# Patient Record
Sex: Female | Born: 1950 | Race: Black or African American | Hispanic: No | Smoking: Never smoker
Health system: Southern US, Community
[De-identification: ages and names within clinical notes are randomized; demographics above are authoritative.]

## PROBLEM LIST (undated history)

## (undated) DIAGNOSIS — H269 Unspecified cataract: Secondary | ICD-10-CM

## (undated) DIAGNOSIS — I10 Essential (primary) hypertension: Secondary | ICD-10-CM

## (undated) DIAGNOSIS — F419 Anxiety disorder, unspecified: Secondary | ICD-10-CM

## (undated) DIAGNOSIS — H3321 Serous retinal detachment, right eye: Secondary | ICD-10-CM

## (undated) DIAGNOSIS — T7840XA Allergy, unspecified, initial encounter: Secondary | ICD-10-CM

## (undated) DIAGNOSIS — D649 Anemia, unspecified: Secondary | ICD-10-CM

## (undated) DIAGNOSIS — N189 Chronic kidney disease, unspecified: Secondary | ICD-10-CM

## (undated) DIAGNOSIS — G4730 Sleep apnea, unspecified: Secondary | ICD-10-CM

## (undated) DIAGNOSIS — I872 Venous insufficiency (chronic) (peripheral): Secondary | ICD-10-CM

## (undated) HISTORY — DX: Serous retinal detachment, right eye: H33.21

## (undated) HISTORY — DX: Unspecified cataract: H26.9

## (undated) HISTORY — PX: CYSTECTOMY: SUR359

## (undated) HISTORY — DX: Anxiety disorder, unspecified: F41.9

## (undated) HISTORY — DX: Allergy, unspecified, initial encounter: T78.40XA

## (undated) HISTORY — PX: POLYPECTOMY: SHX149

## (undated) HISTORY — DX: Sleep apnea, unspecified: G47.30

## (undated) HISTORY — DX: Venous insufficiency (chronic) (peripheral): I87.2

## (undated) HISTORY — PX: COLONOSCOPY: SHX174

## (undated) HISTORY — DX: Anemia, unspecified: D64.9

## (undated) HISTORY — DX: Morbid (severe) obesity due to excess calories: E66.01

## (undated) HISTORY — PX: ABDOMINAL HYSTERECTOMY: SHX81

## (undated) HISTORY — DX: Chronic kidney disease, unspecified: N18.9

## (undated) HISTORY — DX: Essential (primary) hypertension: I10

---

## 1996-11-05 ENCOUNTER — Encounter: Payer: Self-pay | Admitting: Internal Medicine

## 1996-11-05 LAB — CONVERTED CEMR LAB

## 1997-02-02 LAB — HM MAMMOGRAPHY

## 1998-11-24 ENCOUNTER — Ambulatory Visit (HOSPITAL_BASED_OUTPATIENT_CLINIC_OR_DEPARTMENT_OTHER): Admission: RE | Admit: 1998-11-24 | Discharge: 1998-11-24 | Payer: Self-pay | Admitting: General Surgery

## 2001-04-08 ENCOUNTER — Ambulatory Visit (HOSPITAL_COMMUNITY): Admission: RE | Admit: 2001-04-08 | Discharge: 2001-04-08 | Payer: Self-pay | Admitting: Gastroenterology

## 2001-04-08 ENCOUNTER — Encounter: Payer: Self-pay | Admitting: Gastroenterology

## 2003-01-04 ENCOUNTER — Other Ambulatory Visit: Admission: RE | Admit: 2003-01-04 | Discharge: 2003-01-04 | Payer: Self-pay | Admitting: Obstetrics and Gynecology

## 2005-02-19 ENCOUNTER — Ambulatory Visit: Payer: Self-pay | Admitting: Internal Medicine

## 2005-03-20 ENCOUNTER — Ambulatory Visit: Payer: Self-pay | Admitting: Internal Medicine

## 2005-05-10 ENCOUNTER — Ambulatory Visit: Payer: Self-pay | Admitting: Internal Medicine

## 2005-07-09 ENCOUNTER — Ambulatory Visit: Payer: Self-pay | Admitting: Internal Medicine

## 2005-07-23 ENCOUNTER — Ambulatory Visit: Payer: Self-pay | Admitting: Internal Medicine

## 2005-10-18 ENCOUNTER — Ambulatory Visit: Payer: Self-pay | Admitting: Internal Medicine

## 2005-10-22 ENCOUNTER — Ambulatory Visit: Payer: Self-pay | Admitting: Internal Medicine

## 2006-04-28 ENCOUNTER — Ambulatory Visit: Payer: Self-pay | Admitting: Internal Medicine

## 2006-10-24 ENCOUNTER — Ambulatory Visit: Payer: Self-pay | Admitting: Internal Medicine

## 2006-11-17 ENCOUNTER — Ambulatory Visit: Payer: Self-pay | Admitting: Internal Medicine

## 2006-12-09 ENCOUNTER — Ambulatory Visit: Payer: Self-pay | Admitting: Internal Medicine

## 2007-03-06 ENCOUNTER — Ambulatory Visit: Payer: Self-pay | Admitting: Internal Medicine

## 2007-06-09 ENCOUNTER — Encounter: Payer: Self-pay | Admitting: Internal Medicine

## 2007-06-09 DIAGNOSIS — Z862 Personal history of diseases of the blood and blood-forming organs and certain disorders involving the immune mechanism: Secondary | ICD-10-CM | POA: Insufficient documentation

## 2007-06-09 DIAGNOSIS — I872 Venous insufficiency (chronic) (peripheral): Secondary | ICD-10-CM | POA: Insufficient documentation

## 2007-06-09 DIAGNOSIS — D649 Anemia, unspecified: Secondary | ICD-10-CM | POA: Insufficient documentation

## 2007-06-09 DIAGNOSIS — F411 Generalized anxiety disorder: Secondary | ICD-10-CM

## 2007-06-09 DIAGNOSIS — R635 Abnormal weight gain: Secondary | ICD-10-CM | POA: Insufficient documentation

## 2007-06-09 DIAGNOSIS — Z8639 Personal history of other endocrine, nutritional and metabolic disease: Secondary | ICD-10-CM

## 2007-06-09 DIAGNOSIS — I10 Essential (primary) hypertension: Secondary | ICD-10-CM | POA: Insufficient documentation

## 2007-06-09 DIAGNOSIS — F419 Anxiety disorder, unspecified: Secondary | ICD-10-CM | POA: Insufficient documentation

## 2007-06-12 ENCOUNTER — Ambulatory Visit: Payer: Self-pay | Admitting: Internal Medicine

## 2007-09-21 ENCOUNTER — Encounter: Payer: Self-pay | Admitting: Internal Medicine

## 2007-10-08 ENCOUNTER — Encounter: Payer: Self-pay | Admitting: Internal Medicine

## 2007-10-12 ENCOUNTER — Ambulatory Visit: Payer: Self-pay | Admitting: Internal Medicine

## 2007-10-16 LAB — CONVERTED CEMR LAB
BUN: 9 mg/dL (ref 6–23)
Basophils Relative: 0.4 % (ref 0.0–1.0)
CO2: 32 meq/L (ref 19–32)
Calcium: 9.5 mg/dL (ref 8.4–10.5)
Creatinine, Ser: 0.8 mg/dL (ref 0.4–1.2)
Eosinophils Relative: 1.4 % (ref 0.0–5.0)
GFR calc Af Amer: 95 mL/min
Glucose, Bld: 95 mg/dL (ref 70–99)
HCT: 32.7 % — ABNORMAL LOW (ref 36.0–46.0)
Hemoglobin: 11.1 g/dL — ABNORMAL LOW (ref 12.0–15.0)
Leukocytes, UA: NEGATIVE
Lymphocytes Relative: 28.1 % (ref 12.0–46.0)
Monocytes Absolute: 0.5 10*3/uL (ref 0.2–0.7)
Monocytes Relative: 8.5 % (ref 3.0–11.0)
Neutro Abs: 3.6 10*3/uL (ref 1.4–7.7)
Nitrite: NEGATIVE
Potassium: 3.3 meq/L — ABNORMAL LOW (ref 3.5–5.1)
RDW: 12.8 % (ref 11.5–14.6)
Specific Gravity, Urine: 1.02 (ref 1.000–1.03)
Total Protein, Urine: NEGATIVE mg/dL
Triglycerides: 69 mg/dL (ref 0–149)
Urobilinogen, UA: 0.2 (ref 0.0–1.0)
VLDL: 14 mg/dL (ref 0–40)
WBC: 5.9 10*3/uL (ref 4.5–10.5)
pH: 6 (ref 5.0–8.0)

## 2007-10-19 ENCOUNTER — Encounter: Payer: Self-pay | Admitting: Internal Medicine

## 2007-10-20 ENCOUNTER — Telehealth: Payer: Self-pay | Admitting: Internal Medicine

## 2007-10-21 ENCOUNTER — Telehealth: Payer: Self-pay | Admitting: Internal Medicine

## 2007-11-05 DIAGNOSIS — H3321 Serous retinal detachment, right eye: Secondary | ICD-10-CM

## 2007-11-05 HISTORY — DX: Serous retinal detachment, right eye: H33.21

## 2008-01-22 ENCOUNTER — Encounter: Payer: Self-pay | Admitting: Internal Medicine

## 2008-03-29 ENCOUNTER — Encounter: Payer: Self-pay | Admitting: Internal Medicine

## 2008-04-05 ENCOUNTER — Ambulatory Visit: Payer: Self-pay | Admitting: Internal Medicine

## 2008-04-05 LAB — CONVERTED CEMR LAB
BUN: 7 mg/dL (ref 6–23)
CO2: 31 meq/L (ref 19–32)
Glucose, Bld: 99 mg/dL (ref 70–99)
Potassium: 3.5 meq/L (ref 3.5–5.1)
Sodium: 143 meq/L (ref 135–145)

## 2008-04-08 ENCOUNTER — Encounter: Payer: Self-pay | Admitting: Internal Medicine

## 2008-04-12 ENCOUNTER — Ambulatory Visit: Payer: Self-pay | Admitting: Internal Medicine

## 2008-05-26 ENCOUNTER — Encounter: Payer: Self-pay | Admitting: Internal Medicine

## 2008-09-26 ENCOUNTER — Telehealth: Payer: Self-pay | Admitting: Internal Medicine

## 2008-10-11 ENCOUNTER — Ambulatory Visit: Payer: Self-pay | Admitting: Internal Medicine

## 2008-10-11 DIAGNOSIS — R0683 Snoring: Secondary | ICD-10-CM | POA: Insufficient documentation

## 2008-10-11 DIAGNOSIS — G4700 Insomnia, unspecified: Secondary | ICD-10-CM | POA: Insufficient documentation

## 2008-10-12 ENCOUNTER — Telehealth: Payer: Self-pay | Admitting: Internal Medicine

## 2008-10-12 ENCOUNTER — Encounter: Payer: Self-pay | Admitting: Internal Medicine

## 2008-10-13 LAB — CONVERTED CEMR LAB
Basophils Absolute: 0 10*3/uL (ref 0.0–0.1)
Basophils Relative: 0.7 % (ref 0.0–3.0)
Calcium: 9.3 mg/dL (ref 8.4–10.5)
Creatinine, Ser: 0.9 mg/dL (ref 0.4–1.2)
Eosinophils Absolute: 0.1 10*3/uL (ref 0.0–0.7)
GFR calc Af Amer: 82 mL/min
GFR calc non Af Amer: 68 mL/min
Hemoglobin: 11 g/dL — ABNORMAL LOW (ref 12.0–15.0)
MCHC: 33.2 g/dL (ref 30.0–36.0)
MCV: 79.9 fL (ref 78.0–100.0)
Monocytes Absolute: 0.5 10*3/uL (ref 0.1–1.0)
Neutro Abs: 3.4 10*3/uL (ref 1.4–7.7)
RBC: 4.15 M/uL (ref 3.87–5.11)
RDW: 12.9 % (ref 11.5–14.6)
Sodium: 141 meq/L (ref 135–145)
TSH: 1.15 microintl units/mL (ref 0.35–5.50)

## 2008-11-07 ENCOUNTER — Ambulatory Visit: Payer: Self-pay | Admitting: Pulmonary Disease

## 2008-11-07 DIAGNOSIS — G4733 Obstructive sleep apnea (adult) (pediatric): Secondary | ICD-10-CM | POA: Insufficient documentation

## 2008-11-07 DIAGNOSIS — Z9989 Dependence on other enabling machines and devices: Secondary | ICD-10-CM

## 2008-11-09 ENCOUNTER — Ambulatory Visit (HOSPITAL_BASED_OUTPATIENT_CLINIC_OR_DEPARTMENT_OTHER): Admission: RE | Admit: 2008-11-09 | Discharge: 2008-11-09 | Payer: Self-pay | Admitting: Pulmonary Disease

## 2008-11-09 ENCOUNTER — Encounter: Payer: Self-pay | Admitting: Pulmonary Disease

## 2008-11-22 ENCOUNTER — Ambulatory Visit: Payer: Self-pay | Admitting: Pulmonary Disease

## 2008-11-23 ENCOUNTER — Telehealth (INDEPENDENT_AMBULATORY_CARE_PROVIDER_SITE_OTHER): Payer: Self-pay | Admitting: *Deleted

## 2008-11-30 ENCOUNTER — Ambulatory Visit: Payer: Self-pay | Admitting: Pulmonary Disease

## 2008-12-06 ENCOUNTER — Telehealth (INDEPENDENT_AMBULATORY_CARE_PROVIDER_SITE_OTHER): Payer: Self-pay | Admitting: *Deleted

## 2009-01-02 ENCOUNTER — Ambulatory Visit: Payer: Self-pay | Admitting: Pulmonary Disease

## 2009-01-28 ENCOUNTER — Encounter: Payer: Self-pay | Admitting: Pulmonary Disease

## 2009-04-13 ENCOUNTER — Ambulatory Visit: Payer: Self-pay | Admitting: Internal Medicine

## 2009-04-15 LAB — CONVERTED CEMR LAB
BUN: 20 mg/dL (ref 6–23)
Calcium: 9.1 mg/dL (ref 8.4–10.5)
Chloride: 100 meq/L (ref 96–112)
Cholesterol: 177 mg/dL (ref 0–200)
Creatinine, Ser: 1.2 mg/dL (ref 0.4–1.2)
GFR calc non Af Amer: 58.86 mL/min (ref >60)
Hemoglobin, Urine: NEGATIVE
LDL Cholesterol: 122 mg/dL — ABNORMAL HIGH (ref 0–99)
Nitrite: NEGATIVE
Specific Gravity, Urine: 1.02 (ref 1.000–1.030)
Total Protein, Urine: NEGATIVE mg/dL

## 2009-04-18 ENCOUNTER — Ambulatory Visit: Payer: Self-pay | Admitting: Internal Medicine

## 2009-07-05 ENCOUNTER — Ambulatory Visit: Payer: Self-pay | Admitting: Pulmonary Disease

## 2009-07-27 ENCOUNTER — Telehealth: Payer: Self-pay | Admitting: Internal Medicine

## 2009-09-06 ENCOUNTER — Encounter: Admission: RE | Admit: 2009-09-06 | Discharge: 2009-09-06 | Payer: Self-pay | Admitting: Obstetrics and Gynecology

## 2009-09-06 ENCOUNTER — Encounter (INDEPENDENT_AMBULATORY_CARE_PROVIDER_SITE_OTHER): Payer: Self-pay | Admitting: Obstetrics and Gynecology

## 2009-09-14 ENCOUNTER — Ambulatory Visit: Payer: Self-pay | Admitting: Internal Medicine

## 2009-09-14 LAB — CONVERTED CEMR LAB
Albumin: 3.7 g/dL (ref 3.5–5.2)
CO2: 33 meq/L — ABNORMAL HIGH (ref 19–32)
Calcium: 9 mg/dL (ref 8.4–10.5)
Creatinine, Ser: 0.8 mg/dL (ref 0.4–1.2)
Glucose, Bld: 93 mg/dL (ref 70–99)
Total Protein: 7.5 g/dL (ref 6.0–8.3)

## 2009-11-10 ENCOUNTER — Ambulatory Visit: Payer: Self-pay | Admitting: Internal Medicine

## 2009-12-20 ENCOUNTER — Telehealth: Payer: Self-pay | Admitting: Internal Medicine

## 2010-01-15 ENCOUNTER — Telehealth: Payer: Self-pay | Admitting: Internal Medicine

## 2010-02-02 ENCOUNTER — Ambulatory Visit: Payer: Self-pay | Admitting: Internal Medicine

## 2010-02-02 LAB — CONVERTED CEMR LAB
ALT: 19 units/L (ref 0–35)
Cholesterol: 166 mg/dL (ref 0–200)
GFR calc non Af Amer: 81.82 mL/min (ref >60)
HDL: 50.3 mg/dL (ref >39.00)
Potassium: 4.5 meq/L (ref 3.5–5.1)
Sodium: 143 meq/L (ref 135–145)
TSH: 1.52 microintl units/mL (ref 0.35–5.50)
Total Protein: 7.5 g/dL (ref 6.0–8.3)
VLDL: 6.8 mg/dL (ref 0.0–40.0)

## 2010-02-09 ENCOUNTER — Ambulatory Visit: Payer: Self-pay | Admitting: Internal Medicine

## 2010-02-09 DIAGNOSIS — E041 Nontoxic single thyroid nodule: Secondary | ICD-10-CM | POA: Insufficient documentation

## 2010-02-23 ENCOUNTER — Encounter: Admission: RE | Admit: 2010-02-23 | Discharge: 2010-02-23 | Payer: Self-pay | Admitting: Internal Medicine

## 2010-03-02 ENCOUNTER — Telehealth: Payer: Self-pay | Admitting: Internal Medicine

## 2010-03-07 ENCOUNTER — Encounter: Admission: RE | Admit: 2010-03-07 | Discharge: 2010-03-07 | Payer: Self-pay | Admitting: Internal Medicine

## 2010-03-07 ENCOUNTER — Other Ambulatory Visit: Admission: RE | Admit: 2010-03-07 | Discharge: 2010-03-07 | Payer: Self-pay | Admitting: Interventional Radiology

## 2010-08-03 ENCOUNTER — Ambulatory Visit: Payer: Self-pay | Admitting: Internal Medicine

## 2010-08-03 LAB — CONVERTED CEMR LAB
ALT: 17 units/L (ref 0–35)
Albumin: 3.7 g/dL (ref 3.5–5.2)
BUN: 11 mg/dL (ref 6–23)
Bilirubin Urine: NEGATIVE
Chloride: 106 meq/L (ref 96–112)
Cholesterol: 171 mg/dL (ref 0–200)
Eosinophils Relative: 2 % (ref 0.0–5.0)
Glucose, Bld: 84 mg/dL (ref 70–99)
HCT: 31.2 % — ABNORMAL LOW (ref 36.0–46.0)
Hemoglobin, Urine: NEGATIVE
LDL Cholesterol: 116 mg/dL — ABNORMAL HIGH (ref 0–99)
Leukocytes, UA: NEGATIVE
Lymphs Abs: 1.3 10*3/uL (ref 0.7–4.0)
MCV: 82 fL (ref 78.0–100.0)
Monocytes Absolute: 0.5 10*3/uL (ref 0.1–1.0)
Neutro Abs: 3.5 10*3/uL (ref 1.4–7.7)
Nitrite: NEGATIVE
Platelets: 275 10*3/uL (ref 150.0–400.0)
Potassium: 4.2 meq/L (ref 3.5–5.1)
TSH: 1.66 microintl units/mL (ref 0.35–5.50)
Total Bilirubin: 0.5 mg/dL (ref 0.3–1.2)
Total Protein: 7.1 g/dL (ref 6.0–8.3)
Urobilinogen, UA: 0.2 (ref 0.0–1.0)
WBC: 5.4 10*3/uL (ref 4.5–10.5)
pH: 6 (ref 5.0–8.0)

## 2010-08-10 ENCOUNTER — Encounter: Payer: Self-pay | Admitting: Internal Medicine

## 2010-08-10 ENCOUNTER — Ambulatory Visit: Payer: Self-pay | Admitting: Internal Medicine

## 2010-08-10 DIAGNOSIS — E669 Obesity, unspecified: Secondary | ICD-10-CM | POA: Insufficient documentation

## 2010-08-10 DIAGNOSIS — R9431 Abnormal electrocardiogram [ECG] [EKG]: Secondary | ICD-10-CM | POA: Insufficient documentation

## 2010-08-21 ENCOUNTER — Encounter (INDEPENDENT_AMBULATORY_CARE_PROVIDER_SITE_OTHER): Payer: Self-pay | Admitting: *Deleted

## 2010-08-22 ENCOUNTER — Ambulatory Visit: Payer: Self-pay

## 2010-08-22 ENCOUNTER — Ambulatory Visit (HOSPITAL_COMMUNITY): Admission: RE | Admit: 2010-08-22 | Discharge: 2010-08-22 | Payer: Self-pay | Admitting: Internal Medicine

## 2010-08-22 ENCOUNTER — Ambulatory Visit: Payer: Self-pay | Admitting: Cardiovascular Disease

## 2010-08-22 ENCOUNTER — Encounter: Payer: Self-pay | Admitting: Internal Medicine

## 2010-09-21 ENCOUNTER — Encounter (INDEPENDENT_AMBULATORY_CARE_PROVIDER_SITE_OTHER): Payer: Self-pay | Admitting: *Deleted

## 2010-09-24 ENCOUNTER — Telehealth: Payer: Self-pay | Admitting: Gastroenterology

## 2010-09-24 ENCOUNTER — Ambulatory Visit: Payer: Self-pay | Admitting: Gastroenterology

## 2010-11-09 ENCOUNTER — Ambulatory Visit: Admit: 2010-11-09 | Payer: Self-pay | Admitting: Internal Medicine

## 2010-11-16 ENCOUNTER — Ambulatory Visit
Admission: RE | Admit: 2010-11-16 | Discharge: 2010-11-16 | Payer: Self-pay | Source: Home / Self Care | Attending: Internal Medicine | Admitting: Internal Medicine

## 2010-11-20 ENCOUNTER — Ambulatory Visit
Admission: RE | Admit: 2010-11-20 | Discharge: 2010-11-20 | Payer: Self-pay | Source: Home / Self Care | Attending: Gastroenterology | Admitting: Gastroenterology

## 2010-11-20 ENCOUNTER — Other Ambulatory Visit: Payer: Self-pay | Admitting: Gastroenterology

## 2010-11-27 ENCOUNTER — Encounter: Payer: Self-pay | Admitting: Gastroenterology

## 2010-12-04 NOTE — Progress Notes (Signed)
Summary: RESULTS  Phone Note Outgoing Call   Call placed by: Orlan Leavens,  March 02, 2010 11:30 AM Call placed to: Patient Summary of Call: Notified pt concerning her thyroid ultrasound. pt states she would like for md to proceed with biopsy. pls contact pt once test has been set-up. Initial call taken by: Orlan Leavens,  March 02, 2010 11:31 AM  Follow-up for Phone Call        OK Follow-up by: Tresa Garter MD,  March 02, 2010 1:07 PM

## 2010-12-04 NOTE — Progress Notes (Signed)
Summary: swelling  Phone Note Call from Patient Call back at Work Phone [phone]   Summary of Call: Patient was seen recently and some of her meds were changed. Patient called today C/O terrible swelling of her legs, ankles, and feet. Please advise. (after 5 pm call patient at home number) Initial call taken by: Lucious Groves,  December 20, 2009 12:06 PM  Follow-up for Phone Call        Stop Amlodipine- may take a few days to get better OV if bad Take spironolactone 2 a day Elevate feet  Follow-up by: Tresa Garter MD,  December 20, 2009 12:43 PM  Additional Follow-up for Phone Call Additional follow up Details #1::        Patient notified. Additional Follow-up by: Lucious Groves,  December 20, 2009 2:35 PM

## 2010-12-04 NOTE — Miscellaneous (Signed)
Summary: [facility] PV  Clinical Lists Changes  Medications: Added new medication of MOVIPREP 100 GM  SOLR (PEG-KCL-NACL-NASULF-NA ASC-C) As per prep instructions. - Signed Rx of MOVIPREP 100 GM  SOLR (PEG-KCL-NACL-NASULF-NA ASC-C) As per prep instructions.;  #1 x 0;  Signed;  Entered by: Ezra Sites RN;  Authorized by: Meryl Dare MD [facility];  Method used: [facility] [address] [phone]*, [address] [place], [place], [place]  [id number], Ph: [phone] or [phone], Fax: [phone] Allergies: Changed allergy or adverse reaction from PENICILLIN to PENICILLIN    Prescriptions: MOVIPREP 100 GM  SOLR (PEG-KCL-NACL-NASULF-NA ASC-C) As per prep instructions.  #1 x 0   Entered by:   Ezra Sites RN   Authorized by:   Meryl Dare MD [facility]   Signed by:   Ezra Sites RN on 09/24/2010   Method used:   Electronically to        [facility] [address] [phone]* (retail)       [address]       [place], [place]  [id number]       Ph: [phone] or [phone]       Fax: [phone]   RxID:   [id number]

## 2010-12-04 NOTE — Assessment & Plan Note (Signed)
Summary: 3 MO ROV /NWS  #   Vital Signs:  Patient profile:   60 year old female Height:      65 inches Weight:      261.50 pounds BMI:     43.67 O2 Sat:      93 % on Room air Temp:     97.5 degrees F oral Pulse rate:   89 / minute BP sitting:   140 / 90  (left arm) Cuff size:   large  Vitals Entered By: Lucious Groves (February 09, 2010 2:42 PM)  O2 Flow:  Room air CC: 3 mo f/u--Pt states that her edema is better, but she would like to discuss thyroid screening done at work./kb Is Patient Diabetic? No Pain Assessment Patient in pain? no        Primary Care Provider:  Tresa Garter MD  CC:  3 mo f/u--Pt states that her edema is better and but she would like to discuss thyroid screening done at work./kb.  History of Present Illness: The patient presents for a follow up of hypertension, hypokalemia and obesity C/o cyst on thyroid by [place] - Stroke prevention at work   Current Medications (verified): 1)  Cozaar 100 Mg Tabs (Losartan Potassium) .Marland Kitchen.. 1 By Mouth Qd 2)  Aldactone 50 Mg Tabs (Spironolactone) .... Take 2 Tabs Daily 3)  Cardura 8 Mg  Tabs (Doxazosin Mesylate) .... At Bedtime 4)  Coreg 25 Mg  Tabs (Carvedilol) .... Two Times A Day 5)  Prevacid Solutab 30 Mg  Tbdp (Lansoprazole) .Marland Kitchen.. 1 By Mouth Qd 6)  Allegra 180 Mg  Tabs (Fexofenadine Hcl) .... Once Daily 7)  Vitamin D3 1000 Unit  Tabs (Cholecalciferol) .Marland Kitchen.. 1 By Mouth Daily 8)  Zolpidem Tartrate 10 Mg Tabs (Zolpidem Tartrate) .... Once Daily 9)  Estraderm 0.05 Mg/24hr Pttw (Estradiol) .... Once Daily 10)  C-Pap  Allergies (verified): 1)  ! Penicillin  Past History:  Past Medical History: Last updated: 10/11/2008 Anemia-NOS Anxiety Hypertension Venous insuff. LE Detached retina R 2009 Morbid obesity Poss. OSA  Social History: Last updated: 04/18/2009 Occupation: Married with children. Never Smoked - alot of second hand smoke  Review of Systems  The patient denies weight gain, chest pain,  headaches, abdominal pain, and severe indigestion/heartburn.    Physical Exam  General:  obese female in nad Nose:  no skin breakdown or pressure necrosis from cpap  mask. Mouth:  elongation of soft palate and uvula, small post. pharyngeal space. Neck:  No deformities, masses, or tenderness noted. Lungs:  clear, no wheezing Heart:  rrr, no murmur Abdomen:  soft and nt, bs present Msk:  No deformity or scoliosis noted of thoracic or lumbar spine.   Neurologic:  alert, does not appear sleepy moves all 4. Skin:  Intact without suspicious lesions or rashes Psych:  Cognition and judgment appear intact. Alert and cooperative with normal attention span and concentration. No apparent delusions, illusions, hallucinations   Impression & Recommendations:  Problem # 1:  HYPOKALEMIA, HX OF (ICD-V12.2) Assessment Improved  Problem # 2:  WEIGHT GAIN (ICD-783.1) Assessment: Improved  Problem # 3:  HYPERTENSION (ICD-401.9) Assessment: Improved  Her updated medication list for this problem includes:    Cozaar 100 Mg Tabs (Losartan potassium) .Marland Kitchen... 1 by mouth qd    Aldactone 50 Mg Tabs (Spironolactone) .Marland Kitchen... Take 2 tabs daily    Cardura 8 Mg Tabs (Doxazosin mesylate) .Marland Kitchen... At bedtime    Coreg 25 Mg Tabs (Carvedilol) .Marland Kitchen..Marland Kitchen Two times a day  Problem # 4:  THYROID NODULE (ICD-241.0) on vasc US Assessment: New  Orders: Radiology Referral (Radiology)  Complete Medication List: 1)  Cozaar 100 Mg Tabs (Losartan potassium) .Marland Kitchen.. 1 by mouth qd 2)  Aldactone 50 Mg Tabs (Spironolactone) .... Take 2 tabs daily 3)  Cardura 8 Mg Tabs (Doxazosin mesylate) .... At bedtime 4)  Coreg 25 Mg Tabs (Carvedilol) .... Two times a day 5)  Prevacid Solutab 30 Mg Tbdp (Lansoprazole) .Marland Kitchen.. 1 by mouth qd 6)  Allegra 180 Mg Tabs (Fexofenadine hcl) .... Once daily 7)  Vitamin D3 1000 Unit Tabs (Cholecalciferol) .Marland Kitchen.. 1 by mouth daily 8)  Zolpidem Tartrate 10 Mg Tabs (Zolpidem tartrate) .... Once daily 9)  Estraderm 0.05  Mg/24hr Pttw (Estradiol) .... Once daily 10)  C-pap   Other Orders: Capillary Blood Glucose/CBG ([address])  Patient Instructions: 1)  Please schedule a follow-up appointment in 6 months well w/labs.

## 2010-12-04 NOTE — Letter (Signed)
Summary: Pre Visit Letter Revised  [facility]  [address] [place], [place] [address]   Phone: [phone]  Fax: [phone]        08/21/2010 MRN: [id number] Jennifer Gibson [address] [place], [place]  [address]             Procedure Date:  10-04-10   Welcome to the Gastroenterology Division at [facility].    You are scheduled to see a nurse for your pre-procedure visit on 09-20-10 at 4:30p.m. on the 3rd floor at [facility], [address]  We ask that you try to arrive at our office 15 minutes prior to your appointment time to allow for check-in.  Please take a minute to review the attached form.  If you answer "Yes" to one or more of the questions on the first page, we ask that you call the person listed at your earliest opportunity.  If you answer "No" to all of the questions, please complete the rest of the form and bring it to your appointment.    Your nurse visit will consist of discussing your medical and surgical history, your immediate family medical history, and your medications.   If you are unable to list all of your medications on the form, please bring the medication bottles to your appointment and we will list them.  We will need to be aware of both prescribed and over the counter drugs.  We will need to know exact dosage information as well.    Please be prepared to read and sign documents such as consent forms, a financial agreement, and acknowledgement forms.  If necessary, and with your consent, a friend or relative is welcome to sit-in on the nurse visit with you.  Please bring your insurance card so that we may make a copy of it.  If your insurance requires a referral to see a specialist, please bring your referral form from your primary care physician.  No co-pay is required for this nurse visit.     If you cannot keep your appointment, please call [phone] to cancel or reschedule prior to your appointment date.  This allows  [place] the opportunity to schedule an appointment for another patient in need of care.    Thank you for choosing  Gastroenterology for your medical needs.  We appreciate the opportunity to care for you.  Please visit [place] at our website  to learn more about our practice.  Sincerely, The Gastroenterology Division

## 2010-12-04 NOTE — Assessment & Plan Note (Signed)
Summary: 6 MO ROV /NWS $50   Vital Signs:  Patient profile:   60 year old female Weight:      269 pounds Temp:     98.3 degrees F oral Pulse rate:   93 / minute BP sitting:   142 / 80  (left arm)  Vitals Entered By: Tora Perches (November 10, 2009 2:58 PM) CC: f/u Is Patient Diabetic? No   Primary Care Provider:  Tresa Garter MD  CC:  f/u.  History of Present Illness: The patient presents for a follow up of hypertension, hypokalemia, hyperlipidemia   Preventive Screening-Counseling & Management  Alcohol-Tobacco     Smoking Status: never  Allergies: 1)  ! Penicillin  Past History:  Past Medical History: Last updated: 10/11/2008 Anemia-NOS Anxiety Hypertension Venous insuff. LE Detached retina R 2009 Morbid obesity Poss. OSA  Past Surgical History: Last updated: 10/12/2007 Hysterectomy, partial  Social History: Last updated: 04/18/2009 Occupation: Married with children. Never Smoked - alot of second hand smoke  Review of Systems  The patient denies fever, chest pain, abdominal pain, and melena.    Physical Exam  General:  obese female in nad Eyes:  PERRLA and EOMI.   Nose:  no skin breakdown or pressure necrosis from cpap  mask. Mouth:  elongation of soft palate and uvula, small post. pharyngeal space. Lungs:  clear, no wheezing Heart:  rrr, no murmur Abdomen:  soft and nt, bs present Msk:  No deformity or scoliosis noted of thoracic or lumbar spine.   Neurologic:  alert, does not appear sleepy moves all 4. Skin:  Intact without suspicious lesions or rashes Psych:  Cognition and judgment appear intact. Alert and cooperative with normal attention span and concentration. No apparent delusions, illusions, hallucinations   Impression & Recommendations:  Problem # 1:  HYPOKALEMIA, HX OF (ICD-V12.2) Assessment Unchanged On prescription therapy   Problem # 2:  WEIGHT GAIN (ICD-783.1) Assessment: Deteriorated See "Patient Instructions".     Problem # 3:  HYPERTENSION (ICD-401.9) Assessment: Comment Only  The following medications were removed from the medication list:    Bumex 2 Mg Tabs (Bumetanide) .Marland Kitchen... 1/2 tab by mouth daily    Benicar Hct 40-25 Mg Tabs (Olmesartan medoxomil-hctz) ..... Once daily Her updated medication list for this problem includes:    Cozaar 100 Mg Tabs (Losartan potassium) .Marland Kitchen... 1 by mouth qd    Aldactone 50 Mg Tabs (Spironolactone) .Marland Kitchen... 1 by mouth once daily in am    Cardura 8 Mg Tabs (Doxazosin mesylate) .Marland Kitchen... At bedtime    Coreg 25 Mg Tabs (Carvedilol) .Marland Kitchen..Marland Kitchen Two times a day    Amlodipine Besylate 10 Mg Tabs (Amlodipine besylate) .Marland Kitchen... 1 tablet by mouth once a day  Problem # 4:  OBSTRUCTIVE SLEEP APNEA (ICD-327.23) Assessment: Comment Only on On prescription therapy - CPAP  Complete Medication List: 1)  Cozaar 100 Mg Tabs (Losartan potassium) .Marland Kitchen.. 1 by mouth qd 2)  Aldactone 50 Mg Tabs (Spironolactone) .Marland Kitchen.. 1 by mouth once daily in am 3)  Cardura 8 Mg Tabs (Doxazosin mesylate) .... At bedtime 4)  Coreg 25 Mg Tabs (Carvedilol) .... Two times a day 5)  Amlodipine Besylate 10 Mg Tabs (Amlodipine besylate) .Marland Kitchen.. 1 tablet by mouth once a day 6)  Prevacid Solutab 30 Mg Tbdp (Lansoprazole) .Marland Kitchen.. 1 by mouth qd 7)  Allegra 180 Mg Tabs (Fexofenadine hcl) .... Once daily 8)  Vitamin D3 1000 Unit Tabs (Cholecalciferol) .Marland Kitchen.. 1 by mouth daily 9)  Zolpidem Tartrate 10 Mg Tabs (Zolpidem  tartrate) .... Once daily 10)  Estraderm 0.05 Mg/24hr Pttw (Estradiol) .... Once daily 11)  C-pap   Patient Instructions: 1)  Please schedule a follow-up appointment in 3 months. 2)  BMP prior to visit, ICD-9: 3)  Hepatic Panel prior to visit, ICD-9: 4)  Lipid Panel prior to visit, ICD-9: 5)  TSH prior to visit, ICD-9:401.1  995.2 6)  Try to eat more raw plant food, fresh and dry fruit, raw almonds, leafy vegetables, whole foods and less red meat, less animal fat. Poultry and fish is better for you than pork and beef.  Avoid processed foods (canned soups, hot dogs, sausage, bacon , frozen dinners). Avoid corn syrup, high fructose syrup or aspartam and Splenda  containing drinks. Honey, Agave and Stevia are better sweeteners. Make your own  dressing with olive oil, wine vinegar, lemon juce, garlic etc. for your salads. Prescriptions: ALDACTONE 50 MG TABS (SPIRONOLACTONE) 1 by mouth once daily in am  #30 x 12   Entered and Authorized by:   Tresa Garter MD   Signed by:   Tresa Garter MD on 11/10/2009   Method used:   [facility]        [facility] [address] [phone]* (retail)       [address]       [place], [place]  [id number]       Ph: [phone] or [phone]       Fax: [phone]   RxID:   [id number] COZAAR 100 MG TABS (LOSARTAN POTASSIUM) 1 by mouth qd  #30 x 12   Entered and Authorized by:   Tresa Garter MD   Signed by:   Tresa Garter MD on 11/10/2009   Method used:   [facility] to        [facility]  [address] [phone]* (retail)       [address]       [place], [place]  [address]       Ph: [phone] or [phone]       Fax: [phone]   RxID:   [id number] ALDACTONE 50 MG TABS (SPIRONOLACTONE) 1 by mouth once daily in am30  #30 x 12   Entered and Authorized by:   Tresa Garter MD   Signed by:   Tresa Garter MD on 11/10/2009   Method used:   Electronically to        [facility]  [address] [phone]* (retail)       [address]       [place], [place]  [id number]       Ph: [phone] or [phone]       Fax: [phone]   RxID:   [id number]

## 2010-12-04 NOTE — Progress Notes (Signed)
Summary: ? [facility] Propofol  Phone Note Call from Patient   Summary of Call: Dr. Russella Dar, I saw Jennifer Gibson today in [facility].  Her last colonoscopy with you was 2002.  She had incomplete procedure due to pt. discomfort and had to have barium enema.  Would you like her to have procedure with propofol at [facility]?  Also, Dr. Loren Racer ov 10-7 says pt needs EGD and colonoscopy.  Pt is having indigestion symptoms and has had for about 3 years.  I will put pt's chart on your desk for review.  Thanks, Olegario Messier Initial call taken by: Ezra Sites RN,  September 24, 2010 10:47 AM  Follow-up for Phone Call        LEC with propofol... Claudette Head, MD  Called pt to schedule colon at [facility] w/ propofol.  No answer will try pt later.  Ezra Sites RN  September 24, 2010 2:10 PM  Pt scheduled for EGD/Colon at [facility] w/ Propofol on 11/20/10 at 1:30.  Pt notified and changes in prep instructions reviewed w/ pt. Ezra Sites RN  September 25, 2010 7:48 AM  Follow-up by: Meryl Dare MD FACG,  September 24, 2010 1:58 PM  Additional Follow-up for Phone Call Additional follow up Details #1::        Above noted. Please hold this note on your desktop until the plan is finalized. Thx. Additional Follow-up by: Meryl Dare MD Clementeen Graham,  September 24, 2010 3:07 PM

## 2010-12-04 NOTE — Progress Notes (Signed)
Summary: MED Changes  Phone Note Call from Patient   Summary of Call: Patient is requesting refill of spironolactone. See previous phone note. Amlodipine was stopped and spironolactone was increased. EMR updated, rx sent in. Patient is requesting a call when complete.  Initial call taken by: Lamar Sprinkles, CMA,  January 15, 2010 9:52 AM  Follow-up for Phone Call        Left vm for pt to check with her pharmacy Follow-up by: Lamar Sprinkles, CMA,  January 15, 2010 5:24 PM    New/Updated Medications: ALDACTONE 50 MG TABS (SPIRONOLACTONE) take 2 tabs daily Prescriptions: ALDACTONE 50 MG TABS (SPIRONOLACTONE) take 2 tabs daily  #60 x 3   Entered by:   Lamar Sprinkles, CMA   Authorized by:   Tresa Garter MD   Signed by:   Lamar Sprinkles, CMA on 01/15/2010   Method used:   [facility]ronically to        [facility]  [address] [phone]* (retail)       [address]       [place], [place]  [id number]       Ph: [phone] or [phone]       Fax: [phone]   RxID:   [id number]

## 2010-12-04 NOTE — Assessment & Plan Note (Signed)
Summary: CPX /NWS  #   Vital Signs:  Patient profile:   60 year old female Height:      65 inches Weight:      281 pounds BMI:     46.93 Temp:     97.6 degrees F oral Pulse rate:   102 / minute Pulse rhythm:   regular BP sitting:   140 / 80  (left arm) Cuff size:   large  Vitals Entered By: Lanier Prude, Beverly Gust) (August 10, 2010 3:03 PM) CC: CPX Is Patient Diabetic? No Comments pt is not taking Zolpiedm nor does she use her CPAP   Primary Care Provider:  Tresa Garter MD  CC:  CPX.  History of Present Illness: The patient presents for a preventive health examination    Preventive Screening-Counseling & Management  Alcohol-Tobacco     Smoking Status: never  Current Medications (verified): 1)  Cozaar 100 Mg Tabs (Losartan Potassium) .Marland Kitchen.. 1 By Mouth Qd 2)  Aldactone 50 Mg Tabs (Spironolactone) .... Take 2 Tabs Daily 3)  Cardura 8 Mg  Tabs (Doxazosin Mesylate) .... At Bedtime 4)  Coreg 25 Mg  Tabs (Carvedilol) .... Two Times A Day 5)  Prevacid Solutab 30 Mg  Tbdp (Lansoprazole) .Marland Kitchen.. 1 By Mouth Qd 6)  Allegra 180 Mg  Tabs (Fexofenadine Hcl) .... Once Daily 7)  Vitamin D3 1000 Unit  Tabs (Cholecalciferol) .Marland Kitchen.. 1 By Mouth Daily 8)  Zolpidem Tartrate 10 Mg Tabs (Zolpidem Tartrate) .... Once Daily 9)  Estraderm 0.05 Mg/24hr Pttw (Estradiol) .... Once Daily 10)  C-Pap  Allergies (verified): 1)  ! Penicillin  Past History:  Past Medical History: Last updated: 10/11/2008 Anemia-NOS Anxiety Hypertension Venous insuff. LE Detached retina R 2009 Morbid obesity Poss. OSA  Family History: Last updated: 11/07/2008 Family History Hypertension    heart disease: father(m.i.)  Social History: Last updated: 04/18/2009 Occupation: Married with children. Never Smoked - alot of second hand smoke  Review of Systems       The patient complains of weight gain and dyspnea on exertion.  The patient denies anorexia, fever, weight loss, vision loss, decreased  hearing, hoarseness, chest pain, syncope, peripheral edema, prolonged cough, headaches, hemoptysis, abdominal pain, melena, hematochezia, severe indigestion/heartburn, hematuria, incontinence, genital sores, muscle weakness, suspicious skin lesions, transient blindness, difficulty walking, depression, unusual weight change, abnormal bleeding, enlarged lymph nodes, angioedema, and breast masses.    Physical Exam  General:  obese female in nad Head:  Normocephalic and atraumatic without obvious abnormalities. No apparent alopecia or balding. Eyes:  No corneal or conjunctival inflammation noted. EOMI. Perrlal. Ears:  External ear exam shows no significant lesions or deformities.  Otoscopic examination reveals clear canals, tympanic membranes are intact bilaterally without bulging, retraction, inflammation or discharge. Hearing is grossly normal bilaterally. Nose:  External nasal examination shows no deformity or inflammation. Nasal mucosa are pink and moist without lesions or exudates. Mouth:  elongation of soft palate and uvula, small post. pharyngeal space. Neck:  No deformities, masses, or tenderness noted. Lungs:  clear, no wheezing Heart:  rrr, no murmur Abdomen:  Bowel sounds positive,abdomen soft and non-tender without masses, organomegaly or hernias noted. Msk:  No deformity or scoliosis noted of thoracic or lumbar spine.   Pulses:  R and L carotid,radial,femoral,dorsalis pedis and posterior tibial pulses are full and equal bilaterally Extremities:  No edema Neurologic:  No cranial nerve deficits noted. Station and gait are normal. Plantar reflexes are down-going bilaterally. DTRs are symmetrical throughout. Sensory, motor and coordinative functions  appear intact. Skin:  Intact without suspicious lesions or rashes Cervical Nodes:  No lymphadenopathy noted Inguinal Nodes:  No significant adenopathy Psych:  Cognition and judgment appear intact. Alert and cooperative with normal attention span  and concentration. No apparent delusions, illusions, hallucinations   Impression & Recommendations:  Problem # 1:  ROUTINE GENERAL MEDICAL EXAM@HEALTH  CARE FACL (ICD-V70.0) Assessment New GYN/mammo is pending  Orders: EKG w/ Interpretation (93[address]) Gastroenterology Referral (GI) colon/EGD Health and age related issues were discussed. Available screening tests and vaccinations were discussed as well. Healthy life style including good diet and exercise was discussed.  The labs were reviewed with the patient.   Problem # 2:  WEIGHT GAIN, ABNORMAL (ICD-783.1) 20 lbs Assessment: New Discussed diet  Problem # 3:  ABNORMAL ELECTROCARDIOGRAM (ICD-794.31) Assessment: New  Orders: Echo Referral (Echo)  Problem # 4:  OBSTRUCTIVE SLEEP APNEA (ICD-327.23) Assessment: Unchanged Not using CPAP Pulm consult adviced  Problem # 5:  HYPERTENSION (ICD-401.9) Assessment: Unchanged  Her updated medication list for this problem includes:    Cozaar 100 Mg Tabs (Losartan potassium) .Marland Kitchen... 1 by mouth qd    Aldactone 50 Mg Tabs (Spironolactone) .Marland Kitchen... Take 2 tabs daily    Cardura 8 Mg Tabs (Doxazosin mesylate) .Marland Kitchen... At bedtime    Coreg 25 Mg Tabs (Carvedilol) .Marland Kitchen..Marland Kitchen Two times a day  BP today: 140/80 Prior BP: 140/90 (02/09/2010)  Labs Reviewed: K+: 4.2 (08/03/2010) Creat: : 0.9 (08/03/2010)   Chol: 171 (08/03/2010)   HDL: 45.40 (08/03/2010)   LDL: 116 (08/03/2010)   TG: 50.0 (08/03/2010)  Problem # 6:  OBESITY (ICD-278.00) Assessment: Deteriorated Lap band discussed She will try Phentermine and diet Risks vs benefits and controversies of a long term phentermine use were discussed.   Complete Medication List: 1)  Cozaar 100 Mg Tabs (Losartan potassium) .Marland Kitchen.. 1 by mouth qd 2)  Aldactone 50 Mg Tabs (Spironolactone) .... Take 2 tabs daily 3)  Cardura 8 Mg Tabs (Doxazosin mesylate) .... At bedtime 4)  Coreg 25 Mg Tabs (Carvedilol) .... Two times a day 5)  Prevacid Solutab 30 Mg Tbdp (Lansoprazole)  .Marland Kitchen.. 1 by mouth qd 6)  Allegra 180 Mg Tabs (Fexofenadine hcl) .... Once daily 7)  Vitamin D3 1000 Unit Tabs (Cholecalciferol) .Marland Kitchen.. 1 by mouth daily 8)  Zolpidem Tartrate 10 Mg Tabs (Zolpidem tartrate) .... Once daily 9)  Estraderm 0.05 Mg/24hr Pttw (Estradiol) .... Once daily 10)  C-pap  11)  Phentermine Hcl 37.5 Mg Tabs (Phentermine hcl) .Marland Kitchen.. 1 by mouth qam  Patient Instructions: 1)  [email or web address] 2)  Please schedule a follow-up appointment in 3 months. 3)  CBC w/ Diff prior to visit, ICD-9: 4)  iron 5)  TIBC 6)  Vit B12 280.9 Prescriptions: PHENTERMINE HCL 37.5 MG TABS (PHENTERMINE HCL) 1 by mouth qam  #30 x 2   Entered and Authorized by:   Tresa Garter MD   Signed by:   Tresa Garter MD on 08/10/2010   Method used:   Print then Give to Patient   RxID:   [id number]

## 2010-12-04 NOTE — Miscellaneous (Signed)
Summary: Appointment Canceled  Appointment status changed to canceled by [employer] on 08/13/2010 2:06 PM.  Cancellation Comments --------------------- echo/abn ekg/jml  Appointment Information ----------------------- Appt Type:  CARDIOLOGY ANCILLARY VISIT      Date:  Tuesday, August 14, 2010      Time:  11:30 AM for 60 min   Urgency:  Routine   Made By:  Pearson Grippe  To Visit:  [id number]    Reason:  echo/abn ekg/jml  Appt Comments ------------- -- 08/13/10 14:06: (CEMR) CANCELED -- echo/abn ekg/jml -- 08/13/10 14:02: (CEMR) BOOKED -- Routine CARDIOLOGY ANCILLARY VISIT at 08/14/2010 11:30 AM for 60 min echo/abn ekg/jml

## 2010-12-04 NOTE — Letter (Signed)
Summary: [facility] Instructions  [facility]  [address] [place], [place] [address]   Phone: [phone]  Fax: [phone]       Jennifer Gibson    August 08, 1963    MRN: [id number]        Procedure Day /Date: Thursday 10-04-10     Arrival Time: 8:30 am     Procedure Time: 9:30 am     Location of Procedure:                    _x _  [facility] (4th Floor)                        PREPARATION FOR COLONOSCOPY WITH MOVIPREP   Starting 5 days prior to your procedure  09-29-10  do not eat nuts, seeds, popcorn, corn, beans, peas,  salads, or any raw vegetables.  Do not take any fiber supplements (e.g. Metamucil, Citrucel, and Benefiber).  THE DAY BEFORE YOUR PROCEDURE         DATE:  10-03-10   DAY:  Wednesday  1.  Drink clear liquids the entire day-NO SOLID FOOD  2.  Do not drink anything colored red or purple.  Avoid juices with pulp.  No orange juice.  3.  Drink at least 64 oz. (8 glasses) of fluid/clear liquids during the day to prevent dehydration and help the prep work efficiently.  CLEAR LIQUIDS INCLUDE: Water Jello Ice Popsicles Tea (sugar ok, no milk/cream) Powdered fruit flavored drinks Coffee (sugar ok, no milk/cream) Gatorade Juice: apple, white grape, white cranberry  Lemonade Clear bullion, consomm, broth Carbonated beverages (any kind) Strained chicken noodle soup Hard Candy                             4.  In the morning, mix first dose of MoviPrep solution:    Empty 1 Pouch A and 1 Pouch B into the disposable container    Add lukewarm drinking water to the top line of the container. Mix to dissolve    Refrigerate (mixed solution should be used within 24 hrs)  5.  Begin drinking the prep at 5:00 p.m. The MoviPrep container is divided by 4 marks.   Every 15 minutes drink the solution down to the next mark (approximately 8 oz) until the full liter is complete.   6.  Follow completed prep with 16 oz of clear liquid of your choice  (Nothing red or purple).  Continue to drink clear liquids until bedtime.  7.  Before going to bed, mix second dose of MoviPrep solution:    Empty 1 Pouch A and 1 Pouch B into the disposable container    Add lukewarm drinking water to the top line of the container. Mix to dissolve    Refrigerate  THE DAY OF YOUR PROCEDURE      DATE:  10-04-10  DAY:  Thursday  Beginning at  4:30 a.m. (5 hours before procedure):         1. Every 15 minutes, drink the solution down to the next mark (approx 8 oz) until the full liter is complete.  2. Follow completed prep with 16 oz. of clear liquid of your choice.    3. You may drink clear liquids until  7:30 a.m.  (2 HOURS BEFORE PROCEDURE).   MEDICATION INSTRUCTIONS  Unless otherwise instructed, you should take regular prescription medications with a small sip  of water   as early as possible the morning of your procedure.   Additional medication instructions: Do not take Spironolactone day of procedure.         OTHER INSTRUCTIONS  You will need a responsible adult at least 62 years of age to accompany you and drive you home.   This person must remain in the waiting room during your procedure.  Wear loose fitting clothing that is easily removed.  Leave jewelry and other valuables at home.  However, you may wish to bring a book to read or  an iPod/MP3 player to listen to music as you wait for your procedure to start.  Remove all body piercing jewelry and leave at home.  Total time from sign-in until discharge is approximately 2-3 hours.  You should go home directly after your procedure and rest.  You can resume normal activities the  day after your procedure.  The day of your procedure you should not:   Drive   Make legal decisions   Operate machinery   Drink alcohol   Return to work  You will receive specific instructions about eating, activities and medications before you leave.    The above instructions have been  reviewed and explained to me by   Ezra Sites RN  September 24, 2010 9:24 AM    I fully understand and can verbalize these instructions _____________________________ Date _________

## 2010-12-06 ENCOUNTER — Telehealth: Payer: Self-pay | Admitting: Internal Medicine

## 2010-12-06 NOTE — Letter (Signed)
Summary: Patient [facility] Biopsy Results  [facility]  [address] [place], [place] [address]   Phone: [phone]  Fax: [phone]        November 27, 2010 MRN: [id number]    Jennifer Gibson [address] [place], [place]  [address]    Dear Jennifer Gibson,  I am pleased to inform you that the biopsies taken during your recent endoscopic examination did not show any evidence of cancer upon pathologic examination. The biopsies showed benign fundic gland polyps.  Continue with the treatment plan as outlined on the day of your      exam.  Please call us if you are having persistent problems or have questions about your condition that have not been fully answered at this time.  Sincerely,  Meryl Dare MD [facility]  This letter has been electronically signed by your physician.  Appended Document: Patient Notice-Endo Biopsy Results LETTER MAILED

## 2010-12-06 NOTE — Procedures (Addendum)
Summary: Colonoscopy  Patient: Mashell Sieben Note: All result statuses are Final unless otherwise noted.  Tests: (1) Colonoscopy (COL)   COL Colonoscopy           DONE     [facility]     [address]     [place], [place]  [address]           COLONOSCOPY PROCEDURE REPORT     PATIENT:  Jennifer Gibson, Jennifer Gibson  MR#:  [id number]     BIRTHDATE:  25-Nov-1950, 59 yrs. old  GENDER:  female     ENDOSCOPIST:  Judie Petit T. Russella Dar, MD, [facility]           PROCEDURE DATE:  11/20/2010     PROCEDURE:  Colonoscopy with biopsy and snare polypectomy     ASA CLASS:  Class III     INDICATIONS:  1) Routine Risk Screening     MEDICATIONS:   propofol (Diprivan) 300 mg IV     DESCRIPTION OF PROCEDURE:   After the risks benefits and     alternatives of the procedure were thoroughly explained, informed     consent was obtained.  Digital rectal exam was performed and     revealed no abnormalities.   The LB [id number]-[id number] endoscope     was introduced through the anus and advanced to the cecum, which     was identified by both the appendix and ileocecal valve, without     limitations.  The quality of the prep was excellent, using     MoviPrep.  The instrument was then slowly withdrawn as the colon     was fully examined.     <<PROCEDUREIMAGES>>     FINDINGS:  A sessile polyp was found in the cecum. It was 5 mm in     size. Polyp was snared without cautery. Retrieval was successful.     Two polyps were found in the transverse colon. They were 4 mm in     size. The polyps were removed using cold biopsy forceps. Three     polyps were found in the descending colon. They were 4 - 5 mm in     size. Polyps were snared without cautery. Retrieval was     successful. A normal appearing ileocecal valve, and appendiceal     orifice were identified. The ascending, hepatic flexure, splenic     flexure, sigmoid colon, and rectum appeared unremarkable.     Retroflexed views in the rectum revealed no abnormalities. The time  to cecum =  2.25  minutes. The scope was then withdrawn (time =     10  min) from the patient and the procedure completed.     COMPLICATIONS:  None           ENDOSCOPIC IMPRESSION:     1) 5 mm sessile polyp in the cecum     2) 4 mm Two polyps in the transverse colon     3) 4 - 5 mm Three polyps in the descending colon           RECOMMENDATIONS:     1) Await pathology results     2) If the polyps are adenomatous (pre-cancerous) polyps,     colonoscopy in 5 years. Otherwise  colorectal cancer screening     guidelines for "routine risk" patients with colonoscopy in 10     years.           Venita Lick. Russella Dar, MD, [facility]  CC: Trayonna Hedges. Plotnikov, MD           n.     Rosalie DoctorJudie Petit T. Derricka Mertz at 11/20/2010 02:01 PM           Davena, Julian, [id number]  Note: An exclamation mark (!) indicates a result that was not dispersed into the flowsheet. Document Creation Date: 11/20/2010 2:01 PM _______________________________________________________________________  (1) Order result status: Final Collection or observation date-time: 11/20/2010 13:50 Requested date-time:  Receipt date-time:  Reported date-time:  Referring Physician:   Ordering Physician: Claudette Head [phone]) Specimen Source:  Source: Launa Grill Order Number: [id number] Lab site:   Appended Document: Colonoscopy     Procedures Next Due Date:    Colonoscopy: 11/2015

## 2010-12-06 NOTE — Letter (Signed)
Summary: Patient Notice- Polyp Results  [facility]  [address] [place], [place] [address]   Phone: [phone]  Fax: [phone]        November 27, 2010 MRN: [id number]    Jennifer Gibson [address] [place], [place]  [address]    Dear [name],  I am pleased to inform you that the colon polyp(s) removed during your recent colonoscopy was (were) found to be benign (no cancer detected) upon pathologic examination.  I recommend you have a repeat colonoscopy examination in 5 years to look for recurrent polyps, as having colon polyps increases your risk for having recurrent polyps or even colon cancer in the future.  Should you develop new or worsening symptoms of abdominal pain, bowel habit changes or bleeding from the rectum or bowels, please schedule an evaluation with either your primary care physician or with me.  Continue treatment plan as outlined the day of your exam.  Please call us if you are having persistent problems or have questions about your condition that have not been fully answered at this time.  Sincerely,  Meryl Dare MD [facility]  This letter has been electronically signed by your physician.  Appended Document: Patient Notice- Polyp Results LETTER MAILED

## 2010-12-06 NOTE — Procedures (Addendum)
Summary: Upper Endoscopy  Patient: [name] Note: All result statuses are Final unless otherwise noted.  Tests: (1) Upper Endoscopy (EGD)   EGD Upper Endoscopy       DONE     [facility]     [address]     [place], [place]  [address]           ENDOSCOPY PROCEDURE REPORT     PATIENT:  Jennifer Gibson, Jennifer Gibson  MR#:  [id number]     BIRTHDATE:  12/05/1950, 59 yrs. old  GENDER:  female     ENDOSCOPIST:  Judie Petit T. Russella Dar, MD, [facility]           PROCEDURE DATE:  11/20/2010     PROCEDURE:  EGD with biopsy, [address]     ASA CLASS:  Class III     INDICATIONS:  GERD     MEDICATIONS:  There was residual sedation effect present from     prior procedure, propofol (Diprivan) 100 mg IV     TOPICAL ANESTHETIC:  none     DESCRIPTION OF PROCEDURE:   After the risks benefits and     alternatives of the procedure were thoroughly explained, informed     consent was obtained.  The LB GIF-H180 [id number] endoscope was     introduced through the mouth and advanced to the second portion of     the duodenum, without limitations.  The instrument was slowly     withdrawn as the mucosa was fully examined.     <<PROCEDUREIMAGES>>     There were multiple polyps identified. in the body and fundus of     the stomach. They were 3-7 mm in size. Multiple biopsies were     obtained and sent to pathology.  Otherwise normal examination of     the stomach. The esophagus and gastroesophageal junction were     completely normal in appearance.  The duodenal bulb was normal in     appearance, as was the postbulbar duodenum. Retroflexed views     revealed a hiatal hernia, small. The scope was then withdrawn from     the patient and the procedure completed.           COMPLICATIONS:  None           ENDOSCOPIC IMPRESSION:     1) 3 - 7 mm gastric polyps, multiple     2) Small hiatal hernia           RECOMMENDATIONS:     1) Anti-reflux regimen     2) continue PPI     3) Await pathology report           Judie Petit T. Russella Dar,  MD, [facility]           CC: Sonda Primes, MD           n.     Rosalie DoctorVenita Lick. Stark at 11/20/2010 02:06 PM           Kyandra, Mcclaine, [phone]  Note: An exclamation mark (!) indicates a result that was not dispersed into the flowsheet. Document Creation Date: 11/20/2010 2:06 PM _______________________________________________________________________  (1) Order result status: Final Collection or observation date-time: 11/20/2010 13:59 Requested date-time:  Receipt date-time:  Reported date-time:  Referring Physician:   Ordering Physician: Claudette Head [phone]) Specimen Source:  Source: Launa Grill Order Number: [id number] Lab site:

## 2010-12-06 NOTE — Assessment & Plan Note (Signed)
Summary: 3 mos f/u #/cd   Vital Signs:  Patient profile:   60 year old female Height:      65 inches Weight:      275 pounds BMI:     45.93 Temp:     98.2 degrees F oral Pulse rate:   88 / minute Pulse rhythm:   regular Resp:     16 per minute BP sitting:   134 / 80  (left arm) Cuff size:   large  Vitals Entered By: Lanier Prude, CMA(AAMA) (November 16, 2010 3:08 PM) CC: 3 mo f/u  Is Patient Diabetic? No Comments pt is not taking Allegra, Zolpidem or using her CPAP   Primary Care Provider:  Tresa Garter MD  CC:  3 mo f/u .  History of Present Illness: The patient presents for a follow up of hypertension, OSA, hyperlipidemia  Not using CPAP  Current Medications (verified): 1)  Cozaar 100 Mg Tabs (Losartan Potassium) .Marland Kitchen.. 1 By Mouth Qd 2)  Aldactone 50 Mg Tabs (Spironolactone) .... Take 2 Tabs Daily 3)  Cardura 8 Mg  Tabs (Doxazosin Mesylate) .... At Bedtime 4)  Coreg 25 Mg  Tabs (Carvedilol) .... Two Times A Day 5)  Prevacid Solutab 30 Mg  Tbdp (Lansoprazole) .Marland Kitchen.. 1 By Mouth Qd 6)  Allegra 180 Mg  Tabs (Fexofenadine Hcl) .... Once Daily 7)  Vitamin D3 1000 Unit  Tabs (Cholecalciferol) .Marland Kitchen.. 1 By Mouth Daily 8)  Zolpidem Tartrate 10 Mg Tabs (Zolpidem Tartrate) .... Once Daily 9)  Estraderm 0.05 Mg/24hr Pttw (Estradiol) .... Once Daily 10)  C-Pap 11)  Phentermine Hcl 37.5 Mg Tabs (Phentermine Hcl) .Marland Kitchen.. 1 By Mouth Qam  Allergies (verified): 1)  ! Penicillin  Past History:  Past Medical History: Last updated: 10/11/2008 Anemia-NOS Anxiety Hypertension Venous insuff. LE Detached retina R 2009 Morbid obesity Poss. OSA  Social History: Last updated: 04/18/2009 Occupation: Married with children. Never Smoked - alot of second hand smoke  Review of Systems       The patient complains of weight gain and dyspnea on exertion.  The patient denies chest pain, prolonged cough, and abdominal pain.    Physical Exam  General:  obese female in nad Nose:   External nasal examination shows no deformity or inflammation. Nasal mucosa are pink and moist without lesions or exudates. Mouth:  elongation of soft palate and uvula, small post. pharyngeal space. Neck:  No deformities, masses, or tenderness noted. Lungs:  clear, no wheezing Heart:  rrr, no murmur Abdomen:  Bowel sounds positive,abdomen soft and non-tender without masses, organomegaly or hernias noted. Msk:  No deformity or scoliosis noted of thoracic or lumbar spine.   Extremities:  No edema Neurologic:  No cranial nerve deficits noted. Station and gait are normal. Plantar reflexes are down-going bilaterally. DTRs are symmetrical throughout. Sensory, motor and coordinative functions appear intact. Skin:  Intact without suspicious lesions or rashes Cervical Nodes:  No lymphadenopathy noted Psych:  Cognition and judgment appear intact. Alert and cooperative with normal attention span and concentration. No apparent delusions, illusions, hallucinations   Impression & Recommendations:  Problem # 1:  OBESITY (ICD-278.00) Assessment Improved Diet discussed  Problem # 2:  OBSTRUCTIVE SLEEP APNEA (ICD-327.23) Assessment: Deteriorated Risks of noncompliance with treatment discussed. Compliance encouraged.   Problem # 3:  HYPERTENSION (ICD-401.9) Assessment: Unchanged  Her updated medication list for this problem includes:    Cozaar 100 Mg Tabs (Losartan potassium) .Marland Kitchen... 1 by mouth qd    Aldactone 50 Mg Tabs (Spironolactone) .Marland KitchenMarland KitchenMarland KitchenMarland Kitchen  Take 2 tabs daily    Cardura 8 Mg Tabs (Doxazosin mesylate) .Marland Kitchen... At bedtime    Coreg 25 Mg Tabs (Carvedilol) .Marland Kitchen..Marland Kitchen Two times a day  Problem # 4:  HRT (ICD-V07.4) Assessment: Comment Only  Risks incl CVA, DVT etc vs benefits and controversies of a long term HRT use were discussed.   Problem # 5:  WEIGHT GAIN (ICD-783.1) Assessment: Improved  Problem # 6:  ANXIETY (ICD-300.00) Assessment: Improved  Complete Medication List: 1)  Cozaar 100 Mg Tabs  (Losartan potassium) .Marland Kitchen.. 1 by mouth qd 2)  Aldactone 50 Mg Tabs (Spironolactone) .... Take 2 tabs daily 3)  Cardura 8 Mg Tabs (Doxazosin mesylate) .... At bedtime 4)  Coreg 25 Mg Tabs (Carvedilol) .... Two times a day 5)  Prevacid Solutab 30 Mg Tbdp (Lansoprazole) .Marland Kitchen.. 1 by mouth qd 6)  Allegra 180 Mg Tabs (Fexofenadine hcl) .... Once daily 7)  Vitamin D3 1000 Unit Tabs (Cholecalciferol) .Marland Kitchen.. 1 by mouth daily 8)  Zolpidem Tartrate 10 Mg Tabs (Zolpidem tartrate) .... Once daily 9)  Estraderm 0.05 Mg/24hr Pttw (Estradiol) .... Once daily 10)  C-pap  11)  Phentermine Hcl 37.5 Mg Tabs (Phentermine hcl) .Marland Kitchen.. 1 by mouth qam  Patient Instructions: 1)  Please schedule a follow-up appointment in 4 months. 2)  BMP prior to visit, ICD-9:401.1 Prescriptions: PHENTERMINE HCL 37.5 MG TABS (PHENTERMINE HCL) 1 by mouth qam  #30 x 2   Entered and Authorized by:   Tresa Garter MD   Signed by:   Tresa Garter MD on 11/16/2010   Method used:   Print then Give to Patient   RxID:   [id number]    Orders Added: 1)  Est. Patient Level IV [[address]]

## 2010-12-10 ENCOUNTER — Telehealth: Payer: Self-pay | Admitting: Internal Medicine

## 2010-12-20 NOTE — Progress Notes (Signed)
Summary: REQ FOR RX  Phone Note Call from Patient Call back at Work Phone [phone]   Summary of Call: Pt c/o sinus drainage & cough. Has had exposure to co-wker who has strep. left mess to call office back for more details of her symptoms. Initial call taken by: Lamar Sprinkles, CMA,  December 06, 2010 12:31 PM  Follow-up for Phone Call        Spoke w/pt. She is c/o chills, body aches & productive cough. Req rx for antibiotic and cough med. She is using mucinex otc.  Follow-up by: Lamar Sprinkles, CMA,  December 07, 2010 9:52 AM  Additional Follow-up for Phone Call Additional follow up Details #1::        ok zpac Use over-the-counter medicines for "cold": Tylenol  650mg  or Advil 400mg  every 6 hours  for fever; Delsym or Robutussin for cough. Mucinex or Mucinex D for congestion. Ricola or Halls for sore throat. Office visit if not better or if worse.  Additional Follow-up by: Tresa Garter MD,  December 07, 2010 1:21 PM    Additional Follow-up for Phone Call Additional follow up Details #2::    Pt informed  Follow-up by: Lamar Sprinkles, CMA,  December 10, 2010 9:06 AM  New/Updated Medications: ZITHROMAX Z-PAK 250 MG TABS (AZITHROMYCIN) as dirrected Prescriptions: ZITHROMAX Z-PAK 250 MG TABS (AZITHROMYCIN) as dirrected  #1 x 0   Entered and Authorized by:   Tresa Garter MD   Signed by:   Tresa Garter MD on 12/07/2010   Method used:   Electronically to        [facility] [address] [id number] (retail)       [address]       [place], [place]  [id number]       Ph: [phone] or [phone]       Fax: [phone]   RxID:   [id number]

## 2010-12-20 NOTE — Progress Notes (Signed)
Summary: Call Report  Phone Note Other Incoming   Caller: [facility] Summary of Call: [facility] Triage Call Report Triage Record Num: [id number] Operator: April Finney Patient Name: Jennifer Gibson Call Date & Time: 12/08/2010 11:19:26AM Patient Phone: [phone] PCP: Sonda Primes Patient Gender: Female PCP Fax : [phone] Patient DOB: 1951/09/14 Practice Name: [facility] Reason for Call: [name] calling wanting cough medication called in. Started antibiotic on 12/07/10. Reviewed OTC meds and no further needs. Protocol(s) Used: Medication Question Calls, No Triage (Adults) Recommended Outcome per Protocol: Provide Information or Advice Only Reason for Outcome: Caller has medication question only and triager answers question Care Advice:  ~ 02/ Initial call taken by: Margaret Pyle, CMA,  December 10, 2010 8:23 AM  Follow-up for Phone Call        Noted Follow-up by: Tresa Garter MD,  December 10, 2010 4:42 PM

## 2011-02-12 ENCOUNTER — Telehealth: Payer: Self-pay | Admitting: *Deleted

## 2011-02-12 NOTE — Telephone Encounter (Signed)
Please prescribe alt for lansoprazole..... Med is on back order

## 2011-02-13 NOTE — Telephone Encounter (Signed)
Dexilant 60 mg 1 a day #30 11 ref thx

## 2011-02-14 MED ORDER — DEXLANSOPRAZOLE 60 MG PO CPDR: 60.0000 mg | DELAYED_RELEASE_CAPSULE | Freq: Every day | ORAL | Status: DC

## 2011-02-14 NOTE — Telephone Encounter (Signed)
New rx sent. Pt informed  

## 2011-02-15 ENCOUNTER — Other Ambulatory Visit: Payer: Self-pay | Admitting: *Deleted

## 2011-02-15 MED ORDER — DEXLANSOPRAZOLE 60 MG PO CPDR: 60.0000 mg | DELAYED_RELEASE_CAPSULE | Freq: Every day | ORAL | Status: DC

## 2011-03-11 ENCOUNTER — Other Ambulatory Visit: Payer: Self-pay | Admitting: Internal Medicine

## 2011-03-11 ENCOUNTER — Other Ambulatory Visit (INDEPENDENT_AMBULATORY_CARE_PROVIDER_SITE_OTHER): Payer: Managed Care, Other (non HMO)

## 2011-03-11 DIAGNOSIS — I10 Essential (primary) hypertension: Secondary | ICD-10-CM

## 2011-03-11 LAB — BASIC METABOLIC PANEL
CO2: 30 mEq/L (ref 19–32)
Calcium: 9.4 mg/dL (ref 8.4–10.5)
Chloride: 103 mEq/L (ref 96–112)
Glucose, Bld: 81 mg/dL (ref 70–99)
Sodium: 139 mEq/L (ref 135–145)

## 2011-03-15 ENCOUNTER — Encounter: Payer: Self-pay | Admitting: Internal Medicine

## 2011-03-18 ENCOUNTER — Ambulatory Visit (INDEPENDENT_AMBULATORY_CARE_PROVIDER_SITE_OTHER): Payer: Managed Care, Other (non HMO) | Admitting: Internal Medicine

## 2011-03-18 ENCOUNTER — Encounter: Payer: Self-pay | Admitting: Internal Medicine

## 2011-03-18 DIAGNOSIS — Z862 Personal history of diseases of the blood and blood-forming organs and certain disorders involving the immune mechanism: Secondary | ICD-10-CM

## 2011-03-18 DIAGNOSIS — E669 Obesity, unspecified: Secondary | ICD-10-CM

## 2011-03-18 DIAGNOSIS — I10 Essential (primary) hypertension: Secondary | ICD-10-CM

## 2011-03-18 DIAGNOSIS — D649 Anemia, unspecified: Secondary | ICD-10-CM

## 2011-03-18 DIAGNOSIS — Z8639 Personal history of other endocrine, nutritional and metabolic disease: Secondary | ICD-10-CM

## 2011-03-18 MED ORDER — DOXAZOSIN MESYLATE 8 MG PO TABS: 8.0000 mg | ORAL_TABLET | Freq: Every day | ORAL | Status: DC

## 2011-03-18 NOTE — Assessment & Plan Note (Signed)
On Rx  BP Readings from Last 3 Encounters:  03/18/11 130/78  11/16/10 134/80  08/10/10 140/80

## 2011-03-18 NOTE — Assessment & Plan Note (Signed)
Chronic anemia. Check labs

## 2011-03-18 NOTE — Assessment & Plan Note (Signed)
BMET was [facility]

## 2011-03-18 NOTE — Assessment & Plan Note (Signed)
Wt Readings from Last 3 Encounters:  03/18/11 271 lb (122.925 kg)  11/16/10 275 lb (124.739 kg)  08/10/10 281 lb (127.461 kg)

## 2011-03-18 NOTE — Progress Notes (Signed)
  Subjective:    Patient ID: Jennifer Gibson, female    DOB: 09-30-1951, 60 y.o.   MRN: [id number]  HPI  The patient presents for a follow-up of  chronic hypertension, chronic dyslipidemia,anemia controlled with medicines    Review of Systems  Constitutional: Negative for fever, activity change and appetite change.  HENT: Negative for nosebleeds.   Eyes: Negative for itching.  Respiratory: Negative for chest tightness.   Cardiovascular: Positive for leg swelling.  Genitourinary: Negative for flank pain.  Musculoskeletal: Negative for gait problem.  Neurological: Negative for weakness and light-headedness.  Psychiatric/Behavioral: Negative for decreased concentration.       Objective:   Physical Exam  Constitutional: She appears well-developed and well-nourished. No distress.       Obese  HENT:  Head: Normocephalic.  Right Ear: External ear normal.  Left Ear: External ear normal.  Nose: Nose normal.  Mouth/Throat: Oropharynx is clear and moist.  Eyes: Conjunctivae are normal. Pupils are equal, round, and reactive to light. Right eye exhibits no discharge. Left eye exhibits no discharge.  Neck: Normal range of motion. Neck supple. No JVD present. No tracheal deviation present. No thyromegaly present.  Cardiovascular: Normal rate, regular rhythm and normal heart sounds.   Pulmonary/Chest: No stridor. No respiratory distress. She has no wheezes.  Abdominal: Soft. Bowel sounds are normal. She exhibits no distension and no mass. There is no tenderness. There is no rebound and no guarding.  Musculoskeletal: She exhibits no edema and no tenderness.  Lymphadenopathy:    She has no cervical adenopathy.  Neurological: She displays normal reflexes. No cranial nerve deficit. She exhibits normal muscle tone. Coordination normal.  Skin: No rash noted. No erythema.  Psychiatric: She has a normal mood and affect. Her behavior is normal. Judgment and thought content normal.           Assessment & Plan:  ANEMIA-NOS Chronic anemia. Check labs  OBESITY Wt Readings from Last 3 Encounters:  03/18/11 271 lb (122.925 kg)  11/16/10 275 lb (124.739 kg)  08/10/10 281 lb (127.461 kg)     HYPERTENSION On Rx  BP Readings from Last 3 Encounters:  03/18/11 130/78  11/16/10 134/80  08/10/10 140/80     HYPOKALEMIA, HX OF BMET was [facility]

## 2011-03-19 NOTE — Procedures (Signed)
Jennifer Gibson, Jennifer Gibson NO.:  [id number]   MEDICAL RECORD NO.:  [id number]          PATIENT TYPE:  OUT   LOCATION:  SLEEP CENTER                 FACILITY:  [facility]   PHYSICIAN:  Barbaraann Share, MD,FCCPDATE OF BIRTH:  1951-06-30   DATE OF STUDY:  11/09/2008                            NOCTURNAL POLYSOMNOGRAM   REFERRING PHYSICIAN:  Barbaraann Share, MD,FCCP   LOCATION:  Sleep Lap.   INDICATION FOR THE STUDY:  Hypersomnia with sleep apnea.   EPWORTH SLEEPINESS SCORE:  7.   SLEEP ARCHITECTURE:  The patient had a total sleep time of 227 minutes  with no slow-wave sleep and only 43 minutes of REM.  Sleep onset latency  was prolonged at 50 minutes, and REM onset was normal at 65 minutes.  Sleep efficiency was very poor at 60%.   RESPIRATORY DATA:  The patient was found to have 2 obstructive apneas  and 37 hypopneas for an AHI of 10 events per hour.  The events occurred  in all body positions, and there was moderate snoring noted throughout.  It should be noted that the events were much worse during REM.   OXYGEN DATA:  There was O2 desaturation as low as 82% with the patient's  obstructive events.   CARDIAC DATA:  No clinically significant arrhythmias were seen.   MOVEMENT-PARASOMNIA:  The patient was found to have no significant leg  jerks or abnormal behaviors.   IMPRESSIONS-RECOMMENDATIONS:  Mild obstructive sleep apnea/hypopnea  syndrome with an apnea-hypopnea index of 10 events per hour and oxygen  desaturation as low as 82%.  It should be noted the events occurred  primarily during REM.  Treatment for this degree of sleep apnea can  include weight loss alone if applicable, upper airway surgery, oral  appliance, and also continuous positive airway pressure.  Clinical  correlation is suggested.     Barbaraann Share, MD,FCCP  Diplomate, American Board of Sleep  Medicine  Electronically Signed    [facility]/MEDQ  D:  11/22/2008 16:18:37  T:  11/23/2008 05:33:47   Job:  [id number]

## 2011-04-16 ENCOUNTER — Other Ambulatory Visit: Payer: Self-pay | Admitting: Internal Medicine

## 2011-06-08 ENCOUNTER — Other Ambulatory Visit: Payer: Self-pay | Admitting: Internal Medicine

## 2011-07-14 ENCOUNTER — Other Ambulatory Visit: Payer: Self-pay | Admitting: Internal Medicine

## 2011-07-19 ENCOUNTER — Telehealth: Payer: Self-pay | Admitting: *Deleted

## 2011-07-19 NOTE — Telephone Encounter (Signed)
Per Cordelia Pen she needs a letter stating pt is clear to have bariatric surgery and is a good candidate for insurance purposes or it will be denied. Please advise.

## 2011-07-25 NOTE — Telephone Encounter (Signed)
Done. Pls mail Thx

## 2011-07-26 NOTE — Telephone Encounter (Signed)
Letter faxed to Edward Plainfield at [facility] for Bariatric Surgery.

## 2011-07-31 ENCOUNTER — Telehealth: Payer: Self-pay | Admitting: *Deleted

## 2011-07-31 NOTE — Telephone Encounter (Signed)
Ok done Thx 

## 2011-07-31 NOTE — Telephone Encounter (Signed)
Pt is seeing Dr. Franki Cabot tomorrow at 9:40am for bariatric consult. Her ins requires a referral from PCP for that appt. Please enter referral and I will fax it to them at [phone].

## 2011-07-31 NOTE — Telephone Encounter (Signed)
Referral faxed to [phone]

## 2011-08-07 ENCOUNTER — Ambulatory Visit (INDEPENDENT_AMBULATORY_CARE_PROVIDER_SITE_OTHER): Payer: Managed Care, Other (non HMO) | Admitting: Internal Medicine

## 2011-08-07 ENCOUNTER — Encounter: Payer: Self-pay | Admitting: Internal Medicine

## 2011-08-07 DIAGNOSIS — I10 Essential (primary) hypertension: Secondary | ICD-10-CM

## 2011-08-07 DIAGNOSIS — E669 Obesity, unspecified: Secondary | ICD-10-CM

## 2011-08-07 DIAGNOSIS — Z01818 Encounter for other preprocedural examination: Secondary | ICD-10-CM

## 2011-08-07 DIAGNOSIS — F411 Generalized anxiety disorder: Secondary | ICD-10-CM

## 2011-08-07 NOTE — Patient Instructions (Signed)
Good luck with surgery!

## 2011-08-07 NOTE — Progress Notes (Signed)
Subjective:    Patient ID: Jennifer Gibson, female    DOB: 1951/10/20, 60 y.o.   MRN: [id number]  HPI  IM consult Req by Dr Franki Cabot Reason: IM clearance for sleeve gastrectomy10/24 The patient presents for a follow-up of  chronic hypertension, chronic morbid obesity, GERD controlled with medicines, stable She just had labs and EKG with Dr Clent Ridges  BP 122/78  Pulse 84  Temp(Src) 98.1 F (36.7 C) (Oral)  Resp 16  Wt 280 lb (127.007 kg) Past Medical History  Diagnosis Date  . Anemia   . Anxiety   . Hypertension   . Venous insufficiency of leg   . Detached retina, right 2009  . Morbid obesity    Past Surgical History  Procedure Date  . Abdominal hysterectomy     partial    reports that she has never smoked. She does not have any smokeless tobacco history on file. She reports that she does not drink alcohol or use illicit drugs. family history includes Heart disease in her father and Hypertension in her other. Allergies  Allergen Reactions  . Penicillins     REACTION: yeast infection   Current Outpatient Prescriptions on File Prior to Visit  Medication Sig Dispense Refill  . carvedilol (COREG) 25 MG tablet TAKE 1 TABLET TWICE DAILY  60 tablet  5  . Cholecalciferol (EQL VITAMIN D3) 1000 UNITS tablet Take 1,000 Units by mouth daily.        Marland Kitchen dexlansoprazole (DEXILANT) 60 MG capsule Take 60 mg by mouth daily.        Marland Kitchen doxazosin (CARDURA) 8 MG tablet Take 1 tablet (8 mg total) by mouth at bedtime.  30 tablet  11  . fexofenadine (ALLEGRA) 180 MG tablet Take 180 mg by mouth daily.        Marland Kitchen losartan (COZAAR) 100 MG tablet TAKE 1 TABLET BY MOUTH DAILY  30 tablet  5  . spironolactone (ALDACTONE) 50 MG tablet TAKE 2 TABLETS BY MOUTH EVERY DAY  60 tablet  5  . zolpidem (AMBIEN) 10 MG tablet Take 10 mg by mouth daily.           Review of Systems  Constitutional: Negative for fever, chills, diaphoresis, activity change, appetite change, fatigue and unexpected weight change.    HENT: Negative for hearing loss, ear pain, congestion, sore throat, sneezing, mouth sores, neck pain, dental problem, voice change, postnasal drip and sinus pressure.   Eyes: Negative for pain and visual disturbance.  Respiratory: Negative for cough, chest tightness, wheezing and stridor.   Cardiovascular: Negative for chest pain, palpitations and leg swelling.  Gastrointestinal: Negative for nausea, vomiting, abdominal pain, blood in stool, abdominal distention and rectal pain.  Genitourinary: Negative for dysuria, hematuria, decreased urine volume, vaginal bleeding, vaginal discharge, difficulty urinating, vaginal pain and menstrual problem.  Musculoskeletal: Negative for back pain, joint swelling and gait problem.  Skin: Negative for color change, rash and wound.  Neurological: Negative for dizziness, tremors, syncope, speech difficulty and light-headedness.  Hematological: Negative for adenopathy.  Psychiatric/Behavioral: Negative for suicidal ideas, hallucinations, behavioral problems, confusion, sleep disturbance, dysphoric mood and decreased concentration. The patient is not hyperactive.    Wt Readings from Last 3 Encounters:  08/07/11 280 lb (127.007 kg)  03/18/11 271 lb (122.925 kg)  11/16/10 275 lb (124.739 kg)   BP Readings from Last 3 Encounters:  08/07/11 122/78  03/18/11 130/78  11/16/10 134/80        Objective:   Physical Exam  Constitutional: She appears  well-developed. No distress.       obese  HENT:  Head: Normocephalic.  Right Ear: External ear normal.  Left Ear: External ear normal.  Nose: Nose normal.  Mouth/Throat: Oropharynx is clear and moist.  Eyes: Conjunctivae are normal. Pupils are equal, round, and reactive to light. Right eye exhibits no discharge. Left eye exhibits no discharge.  Neck: Normal range of motion. Neck supple. No JVD present. No tracheal deviation present. No thyromegaly present.  Cardiovascular: Normal rate, regular rhythm and normal  heart sounds.   Pulmonary/Chest: No stridor. No respiratory distress. She has no wheezes.  Abdominal: Soft. Bowel sounds are normal. She exhibits no distension and no mass. There is no tenderness. There is no rebound and no guarding.  Musculoskeletal: She exhibits edema (trace B). She exhibits no tenderness.  Lymphadenopathy:    She has no cervical adenopathy.  Neurological: She displays normal reflexes. No cranial nerve deficit. She exhibits normal muscle tone. Coordination normal.  Skin: No rash noted. No erythema.  Psychiatric: She has a normal mood and affect. Her behavior is normal. Judgment and thought content normal.          Assessment & Plan:

## 2011-08-08 ENCOUNTER — Encounter: Payer: Self-pay | Admitting: Internal Medicine

## 2011-08-08 NOTE — Assessment & Plan Note (Signed)
Zolpidem prn insomnia

## 2011-08-08 NOTE — Assessment & Plan Note (Signed)
Better controlled 

## 2011-08-08 NOTE — Assessment & Plan Note (Signed)
Refractory, morbid. Surgery is pending

## 2011-08-08 NOTE — Assessment & Plan Note (Signed)
She is medically clear for a sleeve gastrectomy, assuming her EKG and labs that she had with you were acceptable. I did not order any other labs. Thank you!

## 2011-08-30 ENCOUNTER — Ambulatory Visit: Payer: Managed Care, Other (non HMO) | Admitting: Internal Medicine

## 2011-10-09 ENCOUNTER — Other Ambulatory Visit: Payer: Self-pay | Admitting: *Deleted

## 2011-10-09 ENCOUNTER — Telehealth: Payer: Self-pay | Admitting: *Deleted

## 2011-10-09 MED ORDER — DOXAZOSIN MESYLATE 8 MG PO TABS: 8.0000 mg | ORAL_TABLET | Freq: Every day | ORAL | Status: DC

## 2011-10-09 MED ORDER — SPIRONOLACTONE 50 MG PO TABS: 100.0000 mg | ORAL_TABLET | Freq: Every day | ORAL | Status: DC

## 2011-10-09 MED ORDER — LOSARTAN POTASSIUM 100 MG PO TABS: 100.0000 mg | ORAL_TABLET | Freq: Every day | ORAL | Status: DC

## 2011-10-09 MED ORDER — CARVEDILOL 25 MG PO TABS: 25.0000 mg | ORAL_TABLET | Freq: Two times a day (BID) | ORAL | Status: DC

## 2011-10-09 MED ORDER — DEXLANSOPRAZOLE 60 MG PO CPDR: 60.0000 mg | DELAYED_RELEASE_CAPSULE | Freq: Every day | ORAL | Status: DC

## 2011-10-09 NOTE — Telephone Encounter (Signed)
Pt mailed a letter stating she needs carvedilol, dexilant, doxazosin, losartan and spironolactone sent to [facility] for 90 days.  Meds sent to [facility]. Left detailed mess on pt's home phone informing her of this.

## 2011-10-16 ENCOUNTER — Other Ambulatory Visit: Payer: Self-pay | Admitting: Internal Medicine

## 2011-11-07 ENCOUNTER — Other Ambulatory Visit: Payer: Self-pay | Admitting: *Deleted

## 2011-11-07 ENCOUNTER — Other Ambulatory Visit: Payer: Self-pay | Admitting: Internal Medicine

## 2011-11-07 MED ORDER — DEXLANSOPRAZOLE 60 MG PO CPDR: 60.0000 mg | DELAYED_RELEASE_CAPSULE | Freq: Every day | ORAL | Status: DC

## 2011-11-07 NOTE — Telephone Encounter (Signed)
Pt needs rx for Dexilant sent to [facility] on [address]. She was unable to receive rx through mail order company. Rx sent, pt informed.

## 2011-12-18 ENCOUNTER — Other Ambulatory Visit: Payer: Self-pay | Admitting: *Deleted

## 2011-12-18 MED ORDER — LOSARTAN POTASSIUM 100 MG PO TABS: 100.0000 mg | ORAL_TABLET | Freq: Every day | ORAL | Status: DC

## 2012-01-16 ENCOUNTER — Other Ambulatory Visit: Payer: Self-pay | Admitting: Internal Medicine

## 2012-01-16 NOTE — Telephone Encounter (Signed)
Spironolactone refill previously sent to [facility] on 10/09/11 #180 x 3 refills. Left message for pt to return my call, need to verify if she is getting this from mail order.

## 2012-01-17 ENCOUNTER — Telehealth: Payer: Self-pay | Admitting: Internal Medicine

## 2012-01-17 NOTE — Telephone Encounter (Signed)
See previous note. Phone note had been closed so I could not add to note.    Patient returned phone call regarding spironolactone. Patient states that she does not use mail order pharmacy, [facility]. [facility].

## 2012-01-17 NOTE — Telephone Encounter (Signed)
Refill was [facility] to [facility] yesterday and phone note was closed.

## 2012-01-17 NOTE — Telephone Encounter (Signed)
Left detailed message on pt's cell voicemail re: refill completion to [facility] and to call if any questions.

## 2012-03-17 ENCOUNTER — Telehealth: Payer: Self-pay

## 2012-03-17 NOTE — Telephone Encounter (Signed)
Patient called [facility] c/o leg cramp. Patient need appt, transferred to scheduler

## 2012-03-18 ENCOUNTER — Encounter: Payer: Self-pay | Admitting: Internal Medicine

## 2012-03-18 ENCOUNTER — Ambulatory Visit (INDEPENDENT_AMBULATORY_CARE_PROVIDER_SITE_OTHER): Payer: Managed Care, Other (non HMO) | Admitting: Internal Medicine

## 2012-03-18 VITALS — BP 140/78 | HR 84 | Temp 98.1°F | Resp 16 | Wt 281.0 lb

## 2012-03-18 DIAGNOSIS — M25569 Pain in unspecified knee: Secondary | ICD-10-CM

## 2012-03-18 DIAGNOSIS — M25561 Pain in right knee: Secondary | ICD-10-CM

## 2012-03-18 MED ORDER — IBUPROFEN 600 MG PO TABS: ORAL_TABLET | ORAL | Status: DC

## 2012-03-18 NOTE — Progress Notes (Signed)
  Subjective:    Patient ID: Jennifer Gibson, female    DOB: [birth year], 61 y.o.   MRN: [id number]  HPI  C/o R knee posterior tightness. No pain as such, no LE swelling. No injury.  Review of Systems  Constitutional: Negative for chills, activity change, appetite change, fatigue and unexpected weight change.  HENT: Negative for congestion, mouth sores and sinus pressure.   Eyes: Negative for visual disturbance.  Respiratory: Negative for cough and chest tightness.   Gastrointestinal: Negative for nausea and abdominal pain.  Genitourinary: Negative for frequency, difficulty urinating and vaginal pain.  Musculoskeletal: Negative for back pain and gait problem.  Skin: Negative for pallor and rash.  Neurological: Negative for dizziness, tremors, weakness, numbness and headaches.  Psychiatric/Behavioral: Negative for confusion and sleep disturbance.       Objective:   Physical Exam  Constitutional: She appears well-developed and well-nourished. No distress.       Obese   HENT:  Head: Normocephalic.  Right Ear: External ear normal.  Left Ear: External ear normal.  Nose: Nose normal.  Mouth/Throat: Oropharynx is clear and moist.  Eyes: Conjunctivae are normal. Pupils are equal, round, and reactive to light. Right eye exhibits no discharge. Left eye exhibits no discharge.  Neck: Normal range of motion. Neck supple. No JVD present. No tracheal deviation present. No thyromegaly present.  Cardiovascular: Normal rate, regular rhythm and normal heart sounds.   Pulmonary/Chest: No stridor. No respiratory distress. She has no wheezes.  Abdominal: Soft. Bowel sounds are normal. She exhibits no distension and no mass. There is no tenderness. There is no rebound and no guarding.  Musculoskeletal: She exhibits no edema and no tenderness.  Lymphadenopathy:    She has no cervical adenopathy.  Neurological: She displays normal reflexes. No cranial nerve deficit. She exhibits normal muscle tone.  Coordination normal.  Skin: No rash noted. No erythema.  Psychiatric: She has a normal mood and affect. Her behavior is normal. Judgment and thought content normal.      Procedure Note :    Procedure :   Point of care (POC) sonography examination   Indication: R knee post pain   Equipment used: Sonosite M-Turbo with HFL38x/13-6 MHz transducer linear probe. The images were stored in the unit and later transferred in storage.  The patient was placed in a sitting position.  This study revealed no Baker's cyst. Popliteal vein is compressible.   Impression: No Baker's cyst       Assessment & Plan:

## 2012-03-18 NOTE — Telephone Encounter (Signed)
Pt has an appt today at 4:30 with Dr. Posey Rea.

## 2012-03-18 NOTE — Patient Instructions (Signed)
Call if not better 

## 2012-03-24 ENCOUNTER — Other Ambulatory Visit: Payer: Self-pay

## 2012-03-24 NOTE — Telephone Encounter (Signed)
Pt called stating that her knee has not helped her knee at all. Pt is requesting advisement on further treatment from MD.

## 2012-03-24 NOTE — Telephone Encounter (Signed)
What did not help? Thx

## 2012-03-25 ENCOUNTER — Other Ambulatory Visit: Payer: Self-pay | Admitting: Internal Medicine

## 2012-03-25 NOTE — Telephone Encounter (Signed)
The patient has called back and would like advisement on what to do as Ibuprofen 600 mg BID did not work. The knee is more painful and stiff.  Call back number [phone] or home number [phone]

## 2012-03-25 NOTE — Assessment & Plan Note (Signed)
5/13 posterior -- [facility] [place] - see report See meds

## 2012-03-25 NOTE — Telephone Encounter (Signed)
Sorry, Ibuprofen 600 mg BID

## 2012-03-26 MED ORDER — TRAMADOL HCL 50 MG PO TABS: 50.0000 mg | ORAL_TABLET | Freq: Three times a day (TID) | ORAL | Status: AC | PRN

## 2012-03-26 MED ORDER — MELOXICAM 15 MG PO TABS: 15.0000 mg | ORAL_TABLET | Freq: Every day | ORAL | Status: AC | PRN

## 2012-03-26 NOTE — Telephone Encounter (Signed)
D/c Ibuprofen Start Meloxicam, Tramadol prn Call if not better in a few days - Ortho cons Thx

## 2012-03-26 NOTE — Telephone Encounter (Signed)
Notified pt with md response. Sent med to pt pharmacy... 03/26/12@10 :10am/LMB

## 2012-04-01 ENCOUNTER — Telehealth: Payer: Self-pay | Admitting: *Deleted

## 2012-04-01 DIAGNOSIS — Z0000 Encounter for general adult medical examination without abnormal findings: Secondary | ICD-10-CM

## 2012-04-01 NOTE — Telephone Encounter (Signed)
Labs entered.

## 2012-04-01 NOTE — Telephone Encounter (Signed)
Message copied by Merrilyn Puma on Wed Apr 01, 2012  9:26 AM ------      Message from: Etheleen Sia      Created: Fri Mar 27, 2012 10:19 AM      Regarding: PHYSICAL LABS       AUG 27 PHYSICAL

## 2012-04-13 ENCOUNTER — Other Ambulatory Visit: Payer: Self-pay | Admitting: Internal Medicine

## 2012-04-17 ENCOUNTER — Telehealth: Payer: Self-pay | Admitting: *Deleted

## 2012-04-17 NOTE — Telephone Encounter (Signed)
Left msg on vm completed 1 of the meds md rx for her knee, but her knee is still giving her trouble. Knee is swollen. Wanting to know what she need to do... 04/17/12@1 :35pm/LMB

## 2012-04-17 NOTE — Telephone Encounter (Signed)
Called pt no answer LMOM need f/u appt so md to evaluate... 04/17/12@2 :44pm/LMB

## 2012-04-21 ENCOUNTER — Ambulatory Visit (INDEPENDENT_AMBULATORY_CARE_PROVIDER_SITE_OTHER): Payer: Managed Care, Other (non HMO) | Admitting: Internal Medicine

## 2012-04-21 ENCOUNTER — Encounter: Payer: Self-pay | Admitting: Internal Medicine

## 2012-04-21 VITALS — BP 132/82 | HR 74 | Temp 97.0°F | Ht 65.0 in | Wt 278.2 lb

## 2012-04-21 DIAGNOSIS — F411 Generalized anxiety disorder: Secondary | ICD-10-CM

## 2012-04-21 DIAGNOSIS — I10 Essential (primary) hypertension: Secondary | ICD-10-CM

## 2012-04-21 DIAGNOSIS — M25569 Pain in unspecified knee: Secondary | ICD-10-CM

## 2012-04-21 DIAGNOSIS — M25561 Pain in right knee: Secondary | ICD-10-CM | POA: Insufficient documentation

## 2012-04-21 MED ORDER — TRAMADOL HCL 50 MG PO TABS: 50.0000 mg | ORAL_TABLET | Freq: Four times a day (QID) | ORAL | Status: AC | PRN

## 2012-04-21 NOTE — Assessment & Plan Note (Signed)
Severe with effusion worsening, likely cartilage or meniscus issue - for pain control, MRI and refer ortho

## 2012-04-21 NOTE — Patient Instructions (Addendum)
Take all new medications as prescribed Continue all other medications as before You will be contacted regarding the referral for: orthopedic, and MRI for the right knee

## 2012-04-23 ENCOUNTER — Telehealth: Payer: Self-pay | Admitting: Internal Medicine

## 2012-04-23 NOTE — Telephone Encounter (Signed)
MR KNEE RIGHT WO CONTRAST was not approve by pt insurance the fax is in your folder informed pt of this pls advise on what to do

## 2012-04-24 ENCOUNTER — Telehealth: Payer: Self-pay | Admitting: Internal Medicine

## 2012-04-24 DIAGNOSIS — M25561 Pain in right knee: Secondary | ICD-10-CM

## 2012-04-24 NOTE — Telephone Encounter (Signed)
The pt called and is in need of a x-ray of her knee, before she has an mri done.  She stated she spoke with her insurance company and they require an x-ray prior.   Thanks!

## 2012-04-26 ENCOUNTER — Encounter: Payer: Self-pay | Admitting: Internal Medicine

## 2012-04-26 NOTE — Progress Notes (Signed)
Subjective:    Patient ID: Jennifer Gibson, female    DOB: May 19, 1951, 61 y.o.   MRN: [id number]  HPI  Here to c/o acute onset constant mod to severe sharp right knee pain and swelling, worse to walk on it, better to sit, begun after jumped off porch on mothers day last month,  Pain mild to start now worse, ibuprofen 600 mg prn not working at all,  Has to lieft leg to get in the car, pain now 8/10.  Pt denies chest pain, increased sob or doe, wheezing, orthopnea, PND, increased LE swelling, palpitations, dizziness or syncope.  Pt denies new neurological symptoms such as new headache, or facial or extremity weakness or numbness   Pt denies polydipsia, polyuria.  No overt bleeding or bruising. Past Medical History  Diagnosis Date  . Anemia   . Anxiety   . Hypertension   . Venous insufficiency of leg   . Detached retina, right 2009  . Morbid obesity    Past Surgical History  Procedure Date  . Abdominal hysterectomy     partial    reports that she has never smoked. She does not have any smokeless tobacco history on file. She reports that she does not drink alcohol or use illicit drugs. family history includes Heart disease in her father and Hypertension in her other. Allergies  Allergen Reactions  . Penicillins     REACTION: yeast infection   Current Outpatient Prescriptions on File Prior to Visit  Medication Sig Dispense Refill  . carvedilol (COREG) 25 MG tablet Take 1 tablet (25 mg total) by mouth 2 (two) times daily with a meal.  180 tablet  3  . carvedilol (COREG) 25 MG tablet TAKE 1 TABLET TWICE DAILY  60 tablet  5  . Cholecalciferol (EQL VITAMIN D3) 1000 UNITS tablet Take 1,000 Units by mouth daily.        Marland Kitchen dexlansoprazole (DEXILANT) 60 MG capsule Take 1 capsule (60 mg total) by mouth daily.  90 capsule  3  . doxazosin (CARDURA) 8 MG tablet Take 1 tablet (8 mg total) by mouth at bedtime.  90 tablet  3  . doxazosin (CARDURA) 8 MG tablet TAKE 1 TABLET (8 MG TOTAL) BY MOUTH AT BEDTIME.   30 tablet  9  . fexofenadine (ALLEGRA) 180 MG tablet Take 180 mg by mouth daily.        Marland Kitchen losartan (COZAAR) 100 MG tablet Take 1 tablet (100 mg total) by mouth daily.  30 tablet  5  . meloxicam (MOBIC) 15 MG tablet Take 1 tablet (15 mg total) by mouth daily as needed for pain.  30 tablet  1  . spironolactone (ALDACTONE) 50 MG tablet Take 2 tablets (100 mg total) by mouth daily.  180 tablet  3  . zolpidem (AMBIEN) 10 MG tablet Take 10 mg by mouth daily.         Review of Systems Review of Systems  Constitutional: Negative for diaphoresis and unexpected weight change.  HENT: Negative for tinnitus.   Eyes: Negative for photophobia and visual disturbance.  Gastrointestinal: Negative for vomiting and blood in stool.  Genitourinary: Negative for hematuria and decreased urine volume.  Musculoskeletal: Negative for other joint swelling Skin: Negative for color change and wound.  Neurological: Negative for tremors and numbness.     Objective:   Physical Exam BP 132/82  Pulse 74  Temp 97 F (36.1 C) (Oral)  Ht 5\' 5"  (1.651 m)  Wt 278 lb 4 oz (126.213 kg)  BMI 46.30 kg/m2  SpO2 97% Physical Exam  VS noted Constitutional: Pt appears well-developed and well-nourished.  HENT: Head: Normocephalic.  Right Ear: External ear normal.  Left Ear: External ear normal.  Eyes: Conjunctivae and EOM are normal. Pupils are equal, round, and reactive to light.  Neck: Normal range of motion. Neck supple.  Cardiovascular: Normal rate and regular rhythm.   Pulmonary/Chest: Effort normal and breath sounds normal.  Neurological: Pt is alert Skin: Skin is warm. No erythema.  Psychiatric: Pt behavior is normal. Thought content normal.  Right knee with 2+ effusion, mod tender/warmth, decreased ROM    Assessment & Plan:

## 2012-04-26 NOTE — Assessment & Plan Note (Signed)
stable overall by hx and exam, most recent data reviewed with pt, and pt to continue medical treatment as before BP Readings from Last 3 Encounters:  04/21/12 132/82  03/18/12 140/78  08/07/11 122/78

## 2012-04-26 NOTE — Assessment & Plan Note (Signed)
stable overall by hx and exam, most recent data reviewed with pt, and pt to continue medical treatment as before Lab Results  Component Value Date   WBC 5.4 08/03/2010   HGB 10.3* 08/03/2010   HCT 31.2* 08/03/2010   PLT 275.0 08/03/2010   GLUCOSE 81 03/11/2011   CHOL 171 08/03/2010   TRIG 50.0 08/03/2010   HDL 45.40 08/03/2010   LDLCALC 116* 08/03/2010   ALT 17 08/03/2010   AST 19 08/03/2010   NA 139 03/11/2011   K 4.4 03/11/2011   CL 103 03/11/2011   CREATININE 0.9 03/11/2011   BUN 12 03/11/2011   CO2 30 03/11/2011   TSH 1.66 08/03/2010

## 2012-04-27 ENCOUNTER — Telehealth: Payer: Self-pay

## 2012-04-27 NOTE — Telephone Encounter (Signed)
I have referred to ortho, dated June 18, so should be hearing soon; will forward to [facility]  Also, ok to take 1-2 50 mg ultram every 6 hrs if needed for now

## 2012-04-27 NOTE — Telephone Encounter (Signed)
Called left message on cell and home number to call back 

## 2012-04-27 NOTE — Telephone Encounter (Signed)
Pt called stating that RT knee is still swollen, painful, and warm to the touch. Pt is requesting advisement from MD.

## 2012-04-28 NOTE — Telephone Encounter (Signed)
Closing phone note due to multiple attempts to reach patient. Pcc to notify

## 2012-04-28 NOTE — Telephone Encounter (Signed)
Called left message again on cell and home number  To call back.

## 2012-05-09 NOTE — Telephone Encounter (Signed)
Patient left mess on Saturday clinic VM, she is ready to have the xray of her knee. Please contact patient at [phone] or work [phone].

## 2012-05-10 NOTE — Addendum Note (Signed)
Addended by: Tresa Garter on: 05/10/2012 05:06 PM   Modules accepted: Orders

## 2012-05-14 ENCOUNTER — Telehealth: Payer: Self-pay | Admitting: Internal Medicine

## 2012-05-14 ENCOUNTER — Ambulatory Visit (INDEPENDENT_AMBULATORY_CARE_PROVIDER_SITE_OTHER)
Admission: RE | Admit: 2012-05-14 | Discharge: 2012-05-14 | Disposition: A | Discharge status: Home / Self Care | Payer: Managed Care, Other (non HMO) | Source: Ambulatory Visit | Attending: Internal Medicine | Admitting: Internal Medicine

## 2012-05-14 DIAGNOSIS — M25569 Pain in unspecified knee: Secondary | ICD-10-CM

## 2012-05-14 DIAGNOSIS — M25561 Pain in right knee: Secondary | ICD-10-CM

## 2012-05-14 NOTE — Telephone Encounter (Signed)
Caller: Jennifer Gibson/Patient; PCP: Plotnikov, Alex; CB#: [phone]; ; ; Call regarding R. Knee Pain Follow Up Regarding Referral/MRI; insurance company will not authorize MRI yet, until xray is done.  Has been calling about getting an xray.  Per Epic, xray was ordered 05/10/12.  TC to office staff; per staff, may come in to have xray done at her convenience/no appt necessary.  Patient advised; states will come today.

## 2012-05-14 NOTE — Telephone Encounter (Signed)
Please, inform pt - knee xray with arthritic changes Thx

## 2012-05-14 NOTE — Telephone Encounter (Signed)
Ok Thx 

## 2012-06-17 ENCOUNTER — Other Ambulatory Visit: Payer: Self-pay | Admitting: Internal Medicine

## 2012-06-26 ENCOUNTER — Other Ambulatory Visit (INDEPENDENT_AMBULATORY_CARE_PROVIDER_SITE_OTHER): Payer: Managed Care, Other (non HMO)

## 2012-06-26 DIAGNOSIS — Z0000 Encounter for general adult medical examination without abnormal findings: Secondary | ICD-10-CM

## 2012-06-26 LAB — CBC WITH DIFFERENTIAL/PLATELET
Basophils Absolute: 0 10*3/uL (ref 0.0–0.1)
Basophils Relative: 0 % (ref 0.0–3.0)
Eosinophils Absolute: 0.1 10*3/uL (ref 0.0–0.7)
Hemoglobin: 10.6 g/dL — ABNORMAL LOW (ref 12.0–15.0)
Lymphs Abs: 1.5 10*3/uL (ref 0.7–4.0)
MCHC: 32.2 g/dL (ref 30.0–36.0)
MCV: 81.5 fl (ref 78.0–100.0)
Monocytes Absolute: 0.6 10*3/uL (ref 0.1–1.0)
Neutro Abs: 4.1 10*3/uL (ref 1.4–7.7)
RBC: 4.05 Mil/uL (ref 3.87–5.11)
RDW: 13.3 % (ref 11.5–14.6)

## 2012-06-26 LAB — URINALYSIS, ROUTINE W REFLEX MICROSCOPIC
Bilirubin Urine: NEGATIVE
Ketones, ur: NEGATIVE
Specific Gravity, Urine: 1.02 (ref 1.000–1.030)
Urine Glucose: NEGATIVE
Urobilinogen, UA: 0.2 (ref 0.0–1.0)

## 2012-06-26 LAB — HEPATIC FUNCTION PANEL
AST: 20 U/L (ref 0–37)
Alkaline Phosphatase: 57 U/L (ref 39–117)
Bilirubin, Direct: 0.1 mg/dL (ref 0.0–0.3)
Total Protein: 7.2 g/dL (ref 6.0–8.3)

## 2012-06-26 LAB — LIPID PANEL
HDL: 43.5 mg/dL (ref >39.00)
Total CHOL/HDL Ratio: 4

## 2012-06-26 LAB — BASIC METABOLIC PANEL
CO2: 27 mEq/L (ref 19–32)
Calcium: 9.2 mg/dL (ref 8.4–10.5)
Chloride: 105 mEq/L (ref 96–112)
Potassium: 3.9 mEq/L (ref 3.5–5.1)
Sodium: 139 mEq/L (ref 135–145)

## 2012-06-30 ENCOUNTER — Encounter: Payer: Self-pay | Admitting: Internal Medicine

## 2012-06-30 ENCOUNTER — Ambulatory Visit (INDEPENDENT_AMBULATORY_CARE_PROVIDER_SITE_OTHER): Payer: Managed Care, Other (non HMO) | Admitting: Internal Medicine

## 2012-06-30 VITALS — BP 130/72 | HR 76 | Temp 98.3°F | Resp 16 | Ht 65.0 in | Wt 271.0 lb

## 2012-06-30 DIAGNOSIS — Z0000 Encounter for general adult medical examination without abnormal findings: Secondary | ICD-10-CM | POA: Insufficient documentation

## 2012-06-30 DIAGNOSIS — I10 Essential (primary) hypertension: Secondary | ICD-10-CM

## 2012-06-30 DIAGNOSIS — Z23 Encounter for immunization: Secondary | ICD-10-CM

## 2012-06-30 DIAGNOSIS — M25561 Pain in right knee: Secondary | ICD-10-CM

## 2012-06-30 DIAGNOSIS — E669 Obesity, unspecified: Secondary | ICD-10-CM

## 2012-06-30 DIAGNOSIS — M25569 Pain in unspecified knee: Secondary | ICD-10-CM

## 2012-06-30 DIAGNOSIS — F411 Generalized anxiety disorder: Secondary | ICD-10-CM

## 2012-06-30 NOTE — Assessment & Plan Note (Signed)
Continue with current prescription therapy as reflected on the Med list.  

## 2012-06-30 NOTE — Assessment & Plan Note (Signed)
Slowly better

## 2012-06-30 NOTE — Assessment & Plan Note (Signed)
Loosing wt 

## 2012-06-30 NOTE — Assessment & Plan Note (Signed)
We discussed age appropriate health related issues, including available/recomended screening tests and vaccinations. We discussed a need for adhering to healthy diet and exercise. Labs/EKG were reviewed/ordered. All questions were answered.   

## 2012-06-30 NOTE — Progress Notes (Signed)
Subjective:    Patient ID: Jennifer Gibson, female    DOB: 26-Sep-1951, 61 y.o.   MRN: [id number]  HPI   The patient is here for a wellness exam. The patient has been doing well overall without major physical or psychological issues going on lately. The patient needs to address  chronic hypertension that has been well controlled with medicines; to address chronic  obesityl; and to address hypothyroidism, controlled with medical treatment and diet. C/o R knee pain x 4 mo, better now  Past Medical History  Diagnosis Date  . Anemia   . Anxiety   . Hypertension   . Venous insufficiency of leg   . Detached retina, right 2009  . Morbid obesity    Past Surgical History  Procedure Date  . Abdominal hysterectomy     partial    reports that she has never smoked. She does not have any smokeless tobacco history on file. She reports that she does not drink alcohol or use illicit drugs. family history includes Heart disease in her father and Hypertension in her other. Allergies  Allergen Reactions  . Penicillins     REACTION: yeast infection   Current Outpatient Prescriptions on File Prior to Visit  Medication Sig Dispense Refill  . carvedilol (COREG) 25 MG tablet Take 1 tablet (25 mg total) by mouth 2 (two) times daily with a meal.  180 tablet  3  . Cholecalciferol (EQL VITAMIN D3) 1000 UNITS tablet Take 1,000 Units by mouth daily.        Marland Kitchen doxazosin (CARDURA) 8 MG tablet TAKE 1 TABLET (8 MG TOTAL) BY MOUTH AT BEDTIME.  30 tablet  9  . fexofenadine (ALLEGRA) 180 MG tablet Take 180 mg by mouth daily.        Marland Kitchen losartan (COZAAR) 100 MG tablet TAKE 1 TABLET (100 MG TOTAL) BY MOUTH DAILY.  30 tablet  5  . meloxicam (MOBIC) 15 MG tablet Take 1 tablet (15 mg total) by mouth daily as needed for pain.  30 tablet  1  . spironolactone (ALDACTONE) 50 MG tablet Take 2 tablets (100 mg total) by mouth daily.  180 tablet  3  . DISCONTD: dexlansoprazole (DEXILANT) 60 MG capsule Take 1 capsule (60 mg total) by  mouth daily.  90 capsule  3  . zolpidem (AMBIEN) 10 MG tablet Take 10 mg by mouth daily.        Marland Kitchen DISCONTD: carvedilol (COREG) 25 MG tablet TAKE 1 TABLET TWICE DAILY  60 tablet  5  . DISCONTD: doxazosin (CARDURA) 8 MG tablet Take 1 tablet (8 mg total) by mouth at bedtime.  90 tablet  3   Review of Systems Review of Systems  Constitutional: Negative for diaphoresis and unexpected weight change.  HENT: Negative for tinnitus.   Eyes: Negative for photophobia and visual disturbance.  Gastrointestinal: Negative for vomiting and blood in stool.  Genitourinary: Negative for hematuria and decreased urine volume.  Musculoskeletal: Negative for other joint swelling Skin: Negative for color change and wound.  Neurological: Negative for tremors and numbness.     Objective:   Physical Exam BP 130/72  Pulse 76  Temp 98.3 F (36.8 C) (Oral)  Resp 16  Ht 5\' 5"  (1.651 m)  Wt 271 lb (122.925 kg)  BMI 45.10 kg/m2 Physical Exam  VS noted Constitutional: Pt appears well-developed and well-nourished.  HENT: Head: Normocephalic.  Right Ear: External ear normal.  Left Ear: External ear normal.  Eyes: Conjunctivae and EOM are normal. Pupils are  equal, round, and reactive to light.  Neck: Normal range of motion. Neck supple.  Cardiovascular: Normal rate and regular rhythm.   Pulmonary/Chest: Effort normal and breath sounds normal.  Neurological: Pt is alert Skin: Skin is warm. No erythema.  Psychiatric: Pt behavior is normal. Thought content normal.  Right knee with no effusion, mod tender over b anserina, nl ROM    Assessment & Plan:

## 2012-07-14 ENCOUNTER — Other Ambulatory Visit: Payer: Self-pay | Admitting: Internal Medicine

## 2012-10-02 ENCOUNTER — Other Ambulatory Visit: Payer: Self-pay | Admitting: Internal Medicine

## 2012-10-08 ENCOUNTER — Other Ambulatory Visit: Payer: Self-pay | Admitting: Internal Medicine

## 2012-11-26 ENCOUNTER — Telehealth: Payer: Self-pay | Admitting: Internal Medicine

## 2012-11-26 NOTE — Telephone Encounter (Signed)
Patient Information:  Caller Name: Gwendelyn  Phone: [phone]  Patient: Jennifer Gibson, Jennifer Gibson  Gender: Female  DOB: 1951-05-14  Age: 62 Years  PCP: Plotnikov, Alex (Adults only)  Office Follow Up:  Does the office need to follow up with this patient?: No  Instructions For The Office: N/A   Symptoms  Reason For Call & Symptoms: Diarrhea, Episode of dizziness x 1 which has subsided  Reviewed Health History In EMR: Yes  Reviewed Medications In EMR: Yes  Reviewed Allergies In EMR: Yes  Reviewed Surgeries / Procedures: Yes  Date of Onset of Symptoms: 11/25/2012  Treatments Tried: Immodium AD  Treatments Tried Worked: No  Guideline(s) Used:  Diarrhea  Disposition Per Guideline:   Home Care  Reason For Disposition Reached:   Mild diarrhea  Advice Given:  Reassurance:  In healthy adults, new-onset diarrhea is usually caused by a viral infection of the intestines, which you can treat at home. Diarrhea is the body's way of getting rid of the infection. Here are some tips on how to keep ahead of the fluid losses.  Fluids:  Drink more fluids, at least 8-10 glasses (8 oz or 240 ml) daily.  For example: sports drinks, diluted fruit juices, soft drinks.  Supplement this with saltine crackers or soups to make certain that you are getting sufficient fluid and salt to meet your body's needs.  Avoid caffeinated beverages (Reason: caffeine is mildly dehydrating).  Nutrition:  Maintaining some food intake during episodes of diarrhea is important.  Ideal initial foods include boiled starches/cereals (e.g., potatoes, rice, noodles, wheat, oats) with a small amount of salt to taste.  Other acceptable foods include: bananas, yogurt, crackers, soup.  As your stools return to normal consistency, resume a normal diet.  Diarrhea Medication  - Imodium AD:   Adult dosage: 4 mg (2 capsules or 4 teaspoons or 20 ml) is the recommended first dose. You may take an additional 2 mg (1 capsule or 2 teaspoons or 10  ml) after each loose BM.  Caution: Do not use if you have a fever greater than 100F (37.8C). Do not use if there is blood or mucus in your stools. Do not use for more than 2 days.  Read all package instructions.  Expected Course:  Viral diarrhea lasts 4-7 days. Always worse on days 1 and 2.  Call Back If:  Signs of dehydration occur (e.g., no urine for more than 12 hours, very dry mouth, lightheaded, etc.)  Diarrhea lasts over 7 days  You become worse.

## 2012-12-22 ENCOUNTER — Other Ambulatory Visit: Payer: Self-pay | Admitting: Internal Medicine

## 2012-12-30 ENCOUNTER — Ambulatory Visit: Payer: Managed Care, Other (non HMO) | Admitting: Internal Medicine

## 2013-01-04 ENCOUNTER — Ambulatory Visit: Payer: Managed Care, Other (non HMO) | Admitting: Internal Medicine

## 2013-01-11 ENCOUNTER — Ambulatory Visit: Payer: Managed Care, Other (non HMO) | Admitting: Internal Medicine

## 2013-01-11 ENCOUNTER — Other Ambulatory Visit (INDEPENDENT_AMBULATORY_CARE_PROVIDER_SITE_OTHER): Payer: BC Managed Care – PPO

## 2013-01-11 ENCOUNTER — Encounter: Payer: Self-pay | Admitting: Internal Medicine

## 2013-01-11 ENCOUNTER — Ambulatory Visit (INDEPENDENT_AMBULATORY_CARE_PROVIDER_SITE_OTHER): Payer: BC Managed Care – PPO | Admitting: Internal Medicine

## 2013-01-11 VITALS — BP 140/82 | HR 80 | Temp 96.8°F | Resp 16 | Wt 276.0 lb

## 2013-01-11 DIAGNOSIS — I10 Essential (primary) hypertension: Secondary | ICD-10-CM

## 2013-01-11 DIAGNOSIS — G4700 Insomnia, unspecified: Secondary | ICD-10-CM

## 2013-01-11 DIAGNOSIS — E669 Obesity, unspecified: Secondary | ICD-10-CM

## 2013-01-11 LAB — BASIC METABOLIC PANEL
GFR: 86.57 mL/min (ref >60.00)
Glucose, Bld: 81 mg/dL (ref 70–99)
Potassium: 4.3 mEq/L (ref 3.5–5.1)
Sodium: 139 mEq/L (ref 135–145)

## 2013-01-11 MED ORDER — ZOLPIDEM TARTRATE 10 MG PO TABS: 5.0000 mg | ORAL_TABLET | Freq: Every evening | ORAL | Status: DC | PRN

## 2013-01-11 NOTE — Assessment & Plan Note (Signed)
Continue with current prescription therapy as reflected on the Med list.  

## 2013-01-11 NOTE — Assessment & Plan Note (Signed)
Wt Readings from Last 3 Encounters:  01/11/13 276 lb (125.193 kg)  06/30/12 271 lb (122.925 kg)  04/21/12 278 lb 4 oz (126.213 kg)

## 2013-01-11 NOTE — Assessment & Plan Note (Signed)
Zolpidem prn - low dose

## 2013-01-11 NOTE — Progress Notes (Signed)
Subjective:    HPI    The patient needs to address  chronic hypertension that has been well controlled with medicines; to address chronic  obesityl; and to address hypothyroidism, controlled with medical treatment and diet. F/u R knee pain x 4 mo, better now C/o insomnia  Wt Readings from Last 3 Encounters:  01/11/13 276 lb (125.193 kg)  06/30/12 271 lb (122.925 kg)  04/21/12 278 lb 4 oz (126.213 kg)   BP Readings from Last 3 Encounters:  01/11/13 140/82  06/30/12 130/72  04/21/12 132/82     Past Medical History  Diagnosis Date  . Anemia   . Anxiety   . Hypertension   . Venous insufficiency of leg   . Detached retina, right 2009  . Morbid obesity    Past Surgical History  Procedure Laterality Date  . Abdominal hysterectomy      partial    reports that she has never smoked. She does not have any smokeless tobacco history on file. She reports that she does not drink alcohol or use illicit drugs. family history includes Heart disease in her father and Hypertension in her other. Allergies  Allergen Reactions  . Penicillins     REACTION: yeast infection   Current Outpatient Prescriptions on File Prior to Visit  Medication Sig Dispense Refill  . carvedilol (COREG) 25 MG tablet TAKE 1 TABLET TWICE DAILY  60 tablet  8  . Cholecalciferol (EQL VITAMIN D3) 1000 UNITS tablet Take 1,000 Units by mouth daily.        Marland Kitchen doxazosin (CARDURA) 8 MG tablet TAKE 1 TABLET (8 MG TOTAL) BY MOUTH AT BEDTIME.  30 tablet  9  . losartan (COZAAR) 100 MG tablet TAKE 1 TABLET (100 MG TOTAL) BY MOUTH DAILY.  30 tablet  5  . meloxicam (MOBIC) 15 MG tablet Take 1 tablet (15 mg total) by mouth daily as needed for pain.  30 tablet  1  . spironolactone (ALDACTONE) 50 MG tablet Take 2 tablets (100 mg total) by mouth daily.  180 tablet  3  . DEXILANT 60 MG capsule TAKE 1 CAPSULE (60 MG TOTAL) BY MOUTH DAILY.  90 capsule  2  . fexofenadine (ALLEGRA) 180 MG tablet Take 180 mg by mouth daily.        Marland Kitchen  zolpidem (AMBIEN) 10 MG tablet Take 10 mg by mouth daily.         No current facility-administered medications on file prior to visit.   Review of Systems Review of Systems  Constitutional: Negative for diaphoresis and unexpected weight change.  HENT: Negative for tinnitus.   Eyes: Negative for photophobia and visual disturbance.  Gastrointestinal: Negative for vomiting and blood in stool.  Genitourinary: Negative for hematuria and decreased urine volume.  Musculoskeletal: Negative for other joint swelling Skin: Negative for color change and wound.  Neurological: Negative for tremors and numbness.     Objective:   Physical Exam BP 140/82  Pulse 80  Temp(Src) 96.8 F (36 C) (Oral)  Resp 16  Wt 276 lb (125.193 kg)  BMI 45.93 kg/m2 Physical Exam  VS noted Constitutional: Pt appears well-developed and well-nourished.  HENT: Head: Normocephalic.  Right Ear: External ear normal.  Left Ear: External ear normal.  Eyes: Conjunctivae and EOM are normal. Pupils are equal, round, and reactive to light.  Neck: Normal range of motion. Neck supple.  Cardiovascular: Normal rate and regular rhythm.   Pulmonary/Chest: Effort normal and breath sounds normal.  Neurological: Pt is alert Skin: Skin  is warm. No erythema.  Psychiatric: Pt behavior is normal. Thought content normal.  Right knee with no effusion, mod tender over b anserina, nl ROM     Lab Results  Component Value Date   WBC 6.2 06/26/2012   HGB 10.6* 06/26/2012   HCT 33.0* 06/26/2012   PLT 269.0 06/26/2012   GLUCOSE 88 06/26/2012   CHOL 159 06/26/2012   TRIG 48.0 06/26/2012   HDL 43.50 06/26/2012   LDLCALC 106* 06/26/2012   ALT 18 06/26/2012   AST 20 06/26/2012   NA 139 06/26/2012   K 3.9 06/26/2012   CL 105 06/26/2012   CREATININE 0.8 06/26/2012   BUN 11 06/26/2012   CO2 27 06/26/2012   TSH 1.48 06/26/2012    Assessment & Plan:

## 2013-01-11 NOTE — Patient Instructions (Addendum)
[email or web address] 

## 2013-01-18 ENCOUNTER — Other Ambulatory Visit: Payer: Self-pay | Admitting: Internal Medicine

## 2013-01-28 ENCOUNTER — Other Ambulatory Visit: Payer: Self-pay | Admitting: Internal Medicine

## 2013-01-30 ENCOUNTER — Other Ambulatory Visit: Payer: Self-pay | Admitting: Internal Medicine

## 2013-05-29 ENCOUNTER — Other Ambulatory Visit: Payer: Self-pay | Admitting: Internal Medicine

## 2013-06-03 ENCOUNTER — Other Ambulatory Visit: Payer: Self-pay | Admitting: Internal Medicine

## 2013-07-30 ENCOUNTER — Other Ambulatory Visit: Payer: Self-pay | Admitting: Internal Medicine

## 2013-07-30 NOTE — Telephone Encounter (Signed)
Refill done.  

## 2013-08-24 ENCOUNTER — Encounter: Payer: Self-pay | Admitting: Internal Medicine

## 2013-09-09 ENCOUNTER — Other Ambulatory Visit: Payer: Self-pay

## 2013-10-02 ENCOUNTER — Other Ambulatory Visit: Payer: Self-pay | Admitting: Internal Medicine

## 2013-11-29 ENCOUNTER — Other Ambulatory Visit: Payer: Self-pay | Admitting: Internal Medicine

## 2014-01-29 ENCOUNTER — Other Ambulatory Visit: Payer: Self-pay | Admitting: Internal Medicine

## 2014-02-27 ENCOUNTER — Other Ambulatory Visit: Payer: Self-pay | Admitting: Internal Medicine

## 2014-03-04 ENCOUNTER — Other Ambulatory Visit: Payer: Self-pay | Admitting: Internal Medicine

## 2014-03-07 ENCOUNTER — Other Ambulatory Visit: Payer: Self-pay | Admitting: Internal Medicine

## 2014-03-08 ENCOUNTER — Telehealth: Payer: Self-pay | Admitting: Internal Medicine

## 2014-03-08 NOTE — Telephone Encounter (Signed)
Refill request: Spironolactone, Carvedilol, Losartan, Dexilant, and Doxazosin.   LOV: 01/11/2013

## 2014-03-08 NOTE — Telephone Encounter (Signed)
Refills on Dexilant Dr Aviva Kluver and Doxazosin Mesyl

## 2014-03-11 ENCOUNTER — Encounter: Payer: Self-pay | Admitting: Internal Medicine

## 2014-03-11 ENCOUNTER — Other Ambulatory Visit (INDEPENDENT_AMBULATORY_CARE_PROVIDER_SITE_OTHER): Payer: BC Managed Care – PPO

## 2014-03-11 ENCOUNTER — Ambulatory Visit (INDEPENDENT_AMBULATORY_CARE_PROVIDER_SITE_OTHER): Payer: BC Managed Care – PPO | Admitting: Internal Medicine

## 2014-03-11 VITALS — BP 128/84 | HR 80 | Temp 97.6°F | Resp 16 | Ht 65.0 in | Wt 248.8 lb

## 2014-03-11 DIAGNOSIS — D649 Anemia, unspecified: Secondary | ICD-10-CM | POA: Diagnosis not present

## 2014-03-11 DIAGNOSIS — I10 Essential (primary) hypertension: Secondary | ICD-10-CM

## 2014-03-11 DIAGNOSIS — R739 Hyperglycemia, unspecified: Secondary | ICD-10-CM

## 2014-03-11 DIAGNOSIS — R7309 Other abnormal glucose: Secondary | ICD-10-CM | POA: Diagnosis not present

## 2014-03-11 DIAGNOSIS — R635 Abnormal weight gain: Secondary | ICD-10-CM

## 2014-03-11 DIAGNOSIS — E669 Obesity, unspecified: Secondary | ICD-10-CM

## 2014-03-11 LAB — COMPREHENSIVE METABOLIC PANEL
ALK PHOS: 56 U/L (ref 39–117)
ALT: 19 U/L (ref 0–35)
AST: 19 U/L (ref 0–37)
Albumin: 4 g/dL (ref 3.5–5.2)
BILIRUBIN TOTAL: 0.8 mg/dL (ref 0.2–1.2)
BUN: 12 mg/dL (ref 6–23)
CO2: 30 mEq/L (ref 19–32)
CREATININE: 0.9 mg/dL (ref 0.4–1.2)
Calcium: 9.8 mg/dL (ref 8.4–10.5)
Chloride: 104 mEq/L (ref 96–112)
GFR: 85.09 mL/min (ref >60.00)
Glucose, Bld: 80 mg/dL (ref 70–99)
Potassium: 4.4 mEq/L (ref 3.5–5.1)
SODIUM: 140 meq/L (ref 135–145)
Total Protein: 7.7 g/dL (ref 6.0–8.3)

## 2014-03-11 LAB — URINALYSIS
BILIRUBIN URINE: NEGATIVE
Hgb urine dipstick: NEGATIVE
Ketones, ur: NEGATIVE
LEUKOCYTES UA: NEGATIVE
Nitrite: NEGATIVE
Specific Gravity, Urine: 1.025 (ref 1.000–1.030)
Total Protein, Urine: NEGATIVE
UROBILINOGEN UA: 0.2 (ref 0.0–1.0)
Urine Glucose: NEGATIVE
pH: 6 (ref 5.0–8.0)

## 2014-03-11 LAB — HEPATIC FUNCTION PANEL
ALBUMIN: 4 g/dL (ref 3.5–5.2)
ALT: 19 U/L (ref 0–35)
AST: 19 U/L (ref 0–37)
Alkaline Phosphatase: 56 U/L (ref 39–117)
Bilirubin, Direct: 0 mg/dL (ref 0.0–0.3)
Total Bilirubin: 0.8 mg/dL (ref 0.2–1.2)
Total Protein: 7.7 g/dL (ref 6.0–8.3)

## 2014-03-11 LAB — IBC PANEL
IRON: 72 ug/dL (ref 42–145)
Saturation Ratios: 20.2 % (ref 20.0–50.0)
TRANSFERRIN: 254.2 mg/dL (ref 212.0–360.0)

## 2014-03-11 LAB — TSH: TSH: 1.65 u[IU]/mL (ref 0.35–4.50)

## 2014-03-11 LAB — BASIC METABOLIC PANEL
BUN: 12 mg/dL (ref 6–23)
CALCIUM: 9.8 mg/dL (ref 8.4–10.5)
CHLORIDE: 104 meq/L (ref 96–112)
CO2: 30 mEq/L (ref 19–32)
CREATININE: 0.9 mg/dL (ref 0.4–1.2)
GFR: 85.09 mL/min (ref >60.00)
Glucose, Bld: 80 mg/dL (ref 70–99)
Potassium: 4.4 mEq/L (ref 3.5–5.1)
Sodium: 140 mEq/L (ref 135–145)

## 2014-03-11 LAB — CBC WITH DIFFERENTIAL/PLATELET
Basophils Absolute: 0 10*3/uL (ref 0.0–0.1)
Basophils Relative: 0.4 % (ref 0.0–3.0)
EOS ABS: 0.1 10*3/uL (ref 0.0–0.7)
Eosinophils Relative: 1.2 % (ref 0.0–5.0)
HCT: 34.5 % — ABNORMAL LOW (ref 36.0–46.0)
Hemoglobin: 11.1 g/dL — ABNORMAL LOW (ref 12.0–15.0)
LYMPHS PCT: 22.9 % (ref 12.0–46.0)
Lymphs Abs: 1.5 10*3/uL (ref 0.7–4.0)
MCHC: 32.1 g/dL (ref 30.0–36.0)
MCV: 82.3 fl (ref 78.0–100.0)
Monocytes Absolute: 0.7 10*3/uL (ref 0.1–1.0)
Monocytes Relative: 11.3 % (ref 3.0–12.0)
NEUTROS ABS: 4.1 10*3/uL (ref 1.4–7.7)
NEUTROS PCT: 64.2 % (ref 43.0–77.0)
PLATELETS: 286 10*3/uL (ref 150.0–400.0)
RBC: 4.19 Mil/uL (ref 3.87–5.11)
RDW: 13.9 % (ref 11.5–15.5)
WBC: 6.4 10*3/uL (ref 4.0–10.5)

## 2014-03-11 LAB — HEMOGLOBIN A1C: HEMOGLOBIN A1C: 6 % (ref 4.6–6.5)

## 2014-03-11 MED ORDER — LOSARTAN POTASSIUM 100 MG PO TABS: ORAL_TABLET | ORAL | Status: DC

## 2014-03-11 MED ORDER — SPIRONOLACTONE 50 MG PO TABS: ORAL_TABLET | ORAL | Status: DC

## 2014-03-11 MED ORDER — ESOMEPRAZOLE MAGNESIUM 40 MG PO CPDR: 40.0000 mg | DELAYED_RELEASE_CAPSULE | Freq: Every day | ORAL | Status: DC

## 2014-03-11 MED ORDER — CARVEDILOL 25 MG PO TABS: ORAL_TABLET | ORAL | Status: DC

## 2014-03-11 MED ORDER — DOXAZOSIN MESYLATE 8 MG PO TABS: ORAL_TABLET | ORAL | Status: DC

## 2014-03-11 NOTE — Patient Instructions (Signed)
Zola's diet  - Plan Z 

## 2014-03-11 NOTE — Assessment & Plan Note (Signed)
Continue with current prescription therapy as reflected on the Med list.  

## 2014-03-11 NOTE — Assessment & Plan Note (Signed)
Zola's diet 

## 2014-03-11 NOTE — Progress Notes (Signed)
Subjective:    HPI   Jennifer Gibson retired in 3/15 The patient needs to address  chronic hypertension that has been well controlled with medicines; to address chronic  obesityl; and to address hypothyroidism, controlled with medical treatment and diet. F/u R knee pain x 4 mo, better now. ?lost wt F/u insomnia - better  Wt Readings from Last 3 Encounters:  03/11/14 248 lb 12 oz (112.832 kg)  01/11/13 276 lb (125.193 kg)  06/30/12 271 lb (122.925 kg)   BP Readings from Last 3 Encounters:  03/11/14 128/84  01/11/13 140/82  06/30/12 130/72     Past Medical History  Diagnosis Date  . Anemia   . Anxiety   . Hypertension   . Venous insufficiency of leg   . Detached retina, right 2009  . Morbid obesity    Past Surgical History  Procedure Laterality Date  . Abdominal hysterectomy      partial    reports that she has never smoked. She does not have any smokeless tobacco history on file. She reports that she does not drink alcohol or use illicit drugs. family history includes Heart disease in her father; Hypertension in her other. Allergies  Allergen Reactions  . Penicillins     REACTION: yeast infection   Current Outpatient Prescriptions on File Prior to Visit  Medication Sig Dispense Refill  . carvedilol (COREG) 25 MG tablet TAKE 1 TABLET TWICE DAILY  60 tablet  0  . Cholecalciferol (EQL VITAMIN D3) 1000 UNITS tablet Take 1,000 Units by mouth daily.        Marland Kitchen DEXILANT 60 MG capsule TAKE 1 CAPSULE DAILY  30 capsule  0  . doxazosin (CARDURA) 8 MG tablet TAKE 1 TABLET (8 MG TOTAL) BY MOUTH AT BEDTIME.  30 tablet  5  . fexofenadine (ALLEGRA) 180 MG tablet Take 180 mg by mouth daily.        Marland Kitchen losartan (COZAAR) 100 MG tablet TAKE 1 TABLET (100 MG TOTAL) BY MOUTH DAILY.  30 tablet  0  . spironolactone (ALDACTONE) 50 MG tablet TAKE 2 TABLETS BY MOUTH EVERY DAY  60 tablet  0  . zolpidem (AMBIEN) 10 MG tablet TAKE 1/2 TO 1 FULL TABLET BY MOUTH AT BEDTIME AS NEEDED FOR SLEEP  30 tablet  1    No current facility-administered medications on file prior to visit.   Review of Systems Review of Systems  Constitutional: Negative for diaphoresis and unexpected weight change.  HENT: Negative for tinnitus.   Eyes: Negative for photophobia and visual disturbance.  Gastrointestinal: Negative for vomiting and blood in stool.  Genitourinary: Negative for hematuria and decreased urine volume.  Musculoskeletal: Negative for other joint swelling Skin: Negative for color change and wound.  Neurological: Negative for tremors and numbness.     Objective:   Physical Exam BP 128/84  Pulse 80  Temp(Src) 97.6 F (36.4 C) (Oral)  Resp 16  Ht 5\' 5"  (1.651 m)  Wt 248 lb 12 oz (112.832 kg)  BMI 41.39 kg/m2  SpO2 94% Physical Exam  VS noted Constitutional: Pt appears well-developed and well-nourished.  HENT: Head: Normocephalic.  Right Ear: External ear normal.  Left Ear: External ear normal.  Eyes: Conjunctivae and EOM are normal. Pupils are equal, round, and reactive to light.  Neck: Normal range of motion. Neck supple.  Cardiovascular: Normal rate and regular rhythm.   Pulmonary/Chest: Effort normal and breath sounds normal.  Neurological: Pt is alert Skin: Skin is warm. No erythema.  Psychiatric: Pt behavior  is normal. Thought content normal.  Right knee with no effusion, mod tender over b anserina, nl ROM     Lab Results  Component Value Date   WBC 6.2 06/26/2012   HGB 10.6* 06/26/2012   HCT 33.0* 06/26/2012   PLT 269.0 06/26/2012   GLUCOSE 81 01/11/2013   CHOL 159 06/26/2012   TRIG 48.0 06/26/2012   HDL 43.50 06/26/2012   LDLCALC 106* 06/26/2012   ALT 18 06/26/2012   AST 20 06/26/2012   NA 139 01/11/2013   K 4.3 01/11/2013   CL 103 01/11/2013   CREATININE 0.9 01/11/2013   BUN 11 01/11/2013   CO2 27 01/11/2013   TSH 1.33 01/11/2013    Assessment & Plan:

## 2014-03-11 NOTE — Assessment & Plan Note (Signed)
Wt Readings from Last 3 Encounters:  03/11/14 248 lb 12 oz (112.832 kg)  01/11/13 276 lb (125.193 kg)  06/30/12 271 lb (122.925 kg)

## 2014-03-11 NOTE — Progress Notes (Deleted)
Pre visit review using our clinic review tool, if applicable. No additional management support is needed unless otherwise documented below in the visit note. 

## 2014-03-14 ENCOUNTER — Encounter: Payer: Self-pay | Admitting: Internal Medicine

## 2014-03-21 ENCOUNTER — Telehealth: Payer: Self-pay | Admitting: Internal Medicine

## 2014-03-21 NOTE — Telephone Encounter (Signed)
Refill fax received. MD signature needed.

## 2014-04-01 ENCOUNTER — Other Ambulatory Visit: Payer: Self-pay | Admitting: *Deleted

## 2014-04-01 MED ORDER — DEXLANSOPRAZOLE 60 MG PO CPDR: 60.0000 mg | DELAYED_RELEASE_CAPSULE | Freq: Every day | ORAL | Status: DC

## 2014-04-01 MED ORDER — CARVEDILOL 25 MG PO TABS: ORAL_TABLET | ORAL | Status: DC

## 2014-04-01 MED ORDER — SPIRONOLACTONE 50 MG PO TABS: ORAL_TABLET | ORAL | Status: DC

## 2014-04-01 MED ORDER — LOSARTAN POTASSIUM 100 MG PO TABS: ORAL_TABLET | ORAL | Status: DC

## 2014-04-04 ENCOUNTER — Other Ambulatory Visit: Payer: Self-pay | Admitting: *Deleted

## 2014-04-04 MED ORDER — DEXLANSOPRAZOLE 60 MG PO CPDR: 60.0000 mg | DELAYED_RELEASE_CAPSULE | Freq: Every day | ORAL | Status: DC

## 2014-04-04 MED ORDER — SPIRONOLACTONE 50 MG PO TABS: ORAL_TABLET | ORAL | Status: DC

## 2014-04-04 MED ORDER — DOXAZOSIN MESYLATE 8 MG PO TABS: ORAL_TABLET | ORAL | Status: DC

## 2014-04-04 MED ORDER — LOSARTAN POTASSIUM 100 MG PO TABS: ORAL_TABLET | ORAL | Status: DC

## 2014-04-04 MED ORDER — CARVEDILOL 25 MG PO TABS: ORAL_TABLET | ORAL | Status: DC

## 2014-04-04 MED ORDER — ESOMEPRAZOLE MAGNESIUM 40 MG PO CPDR: 40.0000 mg | DELAYED_RELEASE_CAPSULE | Freq: Every day | ORAL | Status: DC

## 2014-07-14 ENCOUNTER — Telehealth: Payer: Self-pay | Admitting: *Deleted

## 2014-07-14 MED ORDER — SPIRONOLACTONE 50 MG PO TABS: ORAL_TABLET | ORAL | Status: DC

## 2014-07-14 MED ORDER — DOXAZOSIN MESYLATE 8 MG PO TABS: ORAL_TABLET | ORAL | Status: DC

## 2014-07-14 MED ORDER — ESOMEPRAZOLE MAGNESIUM 40 MG PO CPDR: 40.0000 mg | DELAYED_RELEASE_CAPSULE | Freq: Every day | ORAL | Status: DC

## 2014-07-14 MED ORDER — CARVEDILOL 25 MG PO TABS: ORAL_TABLET | ORAL | Status: DC

## 2014-07-14 MED ORDER — LOSARTAN POTASSIUM 100 MG PO TABS: ORAL_TABLET | ORAL | Status: DC

## 2014-07-14 NOTE — Telephone Encounter (Signed)
Pt states she hasn't receive her mail order in yet on her medications requesting 30 day to be sent to [facility]. Inform pt will send....Jennifer Gibson

## 2014-07-20 DIAGNOSIS — Z1231 Encounter for screening mammogram for malignant neoplasm of breast: Secondary | ICD-10-CM | POA: Diagnosis not present

## 2014-07-20 DIAGNOSIS — Z01419 Encounter for gynecological examination (general) (routine) without abnormal findings: Secondary | ICD-10-CM | POA: Diagnosis not present

## 2014-07-20 LAB — HM MAMMOGRAPHY

## 2014-07-28 ENCOUNTER — Telehealth: Payer: Self-pay | Admitting: Internal Medicine

## 2014-07-28 NOTE — Telephone Encounter (Signed)
Pt send a massage to mychart requesting Zostavax Pneumococcal Polysaccharide Vaccine Age 65 And Over Influenza Vaccine. Please advise.

## 2014-07-28 NOTE — Telephone Encounter (Signed)
Is this ok for Zostavax too? Please advise

## 2014-07-28 NOTE — Telephone Encounter (Signed)
Ok  Dietitian and Flu Thx

## 2014-07-28 NOTE — Telephone Encounter (Signed)
No. Can't give Zostavax w/pneumonia vaccine together Thx

## 2014-07-28 NOTE — Telephone Encounter (Signed)
appt set up for 09/01/14 for prev and flu shot, advise pt to schedule zostavax later. Send massage through my chart.

## 2014-08-02 ENCOUNTER — Ambulatory Visit (INDEPENDENT_AMBULATORY_CARE_PROVIDER_SITE_OTHER): Payer: Medicare Other | Admitting: *Deleted

## 2014-08-02 ENCOUNTER — Encounter: Payer: Self-pay | Admitting: Internal Medicine

## 2014-08-02 DIAGNOSIS — Z23 Encounter for immunization: Secondary | ICD-10-CM | POA: Diagnosis not present

## 2014-08-19 ENCOUNTER — Other Ambulatory Visit: Payer: Self-pay

## 2014-08-30 ENCOUNTER — Encounter: Payer: Self-pay | Admitting: Internal Medicine

## 2014-08-31 ENCOUNTER — Telehealth: Payer: Self-pay | Admitting: Internal Medicine

## 2014-08-31 NOTE — Telephone Encounter (Signed)
Pt request injection for Zostavax Pneumococcal Polysaccharide Vaccine Age 65 And Over. Please advise, is this ok?

## 2014-09-01 ENCOUNTER — Ambulatory Visit: Payer: Medicare Other | Admitting: *Deleted

## 2014-09-01 DIAGNOSIS — Z23 Encounter for immunization: Secondary | ICD-10-CM

## 2014-09-01 MED ORDER — PNEUMOCOCCAL VAC POLYVALENT 25 MCG/0.5ML IJ INJ: 0.5000 mL | INJECTION | INTRAMUSCULAR | Status: DC

## 2014-09-01 NOTE — Telephone Encounter (Signed)
Injection appt is set for pneumonia, add note on there that shingle injection can schedule later.

## 2014-09-01 NOTE — Telephone Encounter (Signed)
OK, but not on the same day - these vaccines need to be given separately  Thx

## 2014-09-04 ENCOUNTER — Other Ambulatory Visit: Payer: Self-pay | Admitting: Internal Medicine

## 2014-10-31 ENCOUNTER — Ambulatory Visit (INDEPENDENT_AMBULATORY_CARE_PROVIDER_SITE_OTHER): Payer: Medicare Other

## 2014-10-31 DIAGNOSIS — Z23 Encounter for immunization: Secondary | ICD-10-CM

## 2014-12-07 DIAGNOSIS — H524 Presbyopia: Secondary | ICD-10-CM | POA: Diagnosis not present

## 2014-12-07 DIAGNOSIS — H52223 Regular astigmatism, bilateral: Secondary | ICD-10-CM | POA: Diagnosis not present

## 2014-12-07 DIAGNOSIS — H5213 Myopia, bilateral: Secondary | ICD-10-CM | POA: Diagnosis not present

## 2014-12-07 DIAGNOSIS — H40013 Open angle with borderline findings, low risk, bilateral: Secondary | ICD-10-CM | POA: Diagnosis not present

## 2014-12-07 DIAGNOSIS — H40053 Ocular hypertension, bilateral: Secondary | ICD-10-CM | POA: Diagnosis not present

## 2015-03-19 ENCOUNTER — Other Ambulatory Visit: Payer: Self-pay | Admitting: Internal Medicine

## 2015-05-30 ENCOUNTER — Other Ambulatory Visit: Payer: Self-pay | Admitting: Internal Medicine

## 2015-06-17 ENCOUNTER — Other Ambulatory Visit: Payer: Self-pay | Admitting: Internal Medicine

## 2015-08-01 ENCOUNTER — Encounter: Payer: Self-pay | Admitting: Family

## 2015-08-01 ENCOUNTER — Ambulatory Visit (INDEPENDENT_AMBULATORY_CARE_PROVIDER_SITE_OTHER): Payer: Medicare Other | Admitting: Family

## 2015-08-01 VITALS — BP 138/92 | HR 75 | Temp 98.1°F | Resp 18 | Ht 65.0 in | Wt 276.0 lb

## 2015-08-01 DIAGNOSIS — J019 Acute sinusitis, unspecified: Secondary | ICD-10-CM | POA: Diagnosis not present

## 2015-08-01 DIAGNOSIS — Z23 Encounter for immunization: Secondary | ICD-10-CM

## 2015-08-01 MED ORDER — DOXYCYCLINE HYCLATE 100 MG PO TABS: 100.0000 mg | ORAL_TABLET | Freq: Two times a day (BID) | ORAL | Status: DC

## 2015-08-01 NOTE — Progress Notes (Signed)
Subjective:    Patient ID: Jennifer Gibson, female    DOB: 1950/11/26, 64 y.o.   MRN: [id number]  Chief Complaint  Patient presents with  . Cough    x1 week, productive cough, runny nose, chest congestion    HPI:  Jennifer Gibson is a 64 y.o. female who  has a past medical history of Anemia; Anxiety; Hypertension; Venous insufficiency of leg; Detached retina, right (2009); and Morbid obesity. and presents today for an acute office visit.   This is a new problem. Associated symptoms of cough, runny nose, and chest congestion have been going on for approximately 10 days. Modifying factors include Tylenol Sinus and Mucinex which have helped with her symptoms but has not helped with the course of her illness. Severity of the cough is enough to disturb her sleep. Denies any recent antibiotic use  Allergies  Allergen Reactions  . Penicillins     REACTION: yeast infection     Current Outpatient Prescriptions on File Prior to Visit  Medication Sig Dispense Refill  . carvedilol (COREG) 25 MG tablet Take 1 tablet (25 mg total) by mouth 2 (two) times daily with a meal. 180 tablet 3  . Cholecalciferol (EQL VITAMIN D3) 1000 UNITS tablet Take 1,000 Units by mouth daily.      Marland Kitchen DEXILANT 60 MG capsule TAKE 1 CAPSULE DAILY 90 capsule 0  . doxazosin (CARDURA) 8 MG tablet Take 1 tablet (8 mg total) by mouth daily. 90 tablet 3  . esomeprazole (NEXIUM) 40 MG capsule TAKE 1 CAPSULE (40 MG TOTAL) BY MOUTH DAILY. 30 capsule 5  . fexofenadine (ALLEGRA) 180 MG tablet Take 180 mg by mouth daily.      Marland Kitchen losartan (COZAAR) 100 MG tablet Take 1 tablet (100 mg total) by mouth daily. 90 tablet 3  . spironolactone (ALDACTONE) 50 MG tablet Take 2 tablets (100 mg total) by mouth daily. 180 tablet 1  . zolpidem (AMBIEN) 10 MG tablet TAKE 1/2 TO 1 FULL TABLET BY MOUTH AT BEDTIME AS NEEDED FOR SLEEP 30 tablet 1   Current Facility-Administered Medications on File Prior to Visit  Medication Dose Route Frequency Provider  Last Rate Last Dose  . pneumococcal 23 valent vaccine (PNU-IMMUNE) injection 0.5 mL  0.5 mL Intramuscular Tomorrow-1000 Aleksei Plotnikov V, MD        Review of Systems  Constitutional: Negative for fever and chills.  HENT: Positive for congestion, sinus pressure, sneezing and sore throat. Negative for ear pain.   Respiratory: Positive for cough and chest tightness. Negative for shortness of breath.   Neurological: Positive for headaches.      Objective:    BP 138/92 mmHg  Pulse 75  Temp(Src) 98.1 F (36.7 C) (Oral)  Resp 18  Ht 5\' 5"  (1.651 m)  Wt 276 lb (125.193 kg)  BMI 45.93 kg/m2  SpO2 99% Nursing note and vital signs reviewed.  Physical Exam  Constitutional: She is oriented to person, place, and time. She appears well-developed and well-nourished. No distress.  HENT:  Right Ear: Hearing, tympanic membrane, external ear and ear canal normal.  Left Ear: Hearing, tympanic membrane, external ear and ear canal normal.  Nose: Right sinus exhibits maxillary sinus tenderness. Right sinus exhibits no frontal sinus tenderness. Left sinus exhibits maxillary sinus tenderness. Left sinus exhibits no frontal sinus tenderness.  Mouth/Throat: Uvula is midline, oropharynx is clear and moist and mucous membranes are normal.  Cardiovascular: Normal rate, regular rhythm, normal heart sounds and intact distal pulses.  Pulmonary/Chest: Effort normal and breath sounds normal.  Neurological: She is alert and oriented to person, place, and time.  Skin: Skin is warm and dry.  Psychiatric: She has a normal mood and affect. Her behavior is normal. Judgment and thought content normal.       Assessment & Plan:   Problem List Items Addressed This Visit      Respiratory   Sinusitis, acute    Symptoms and exam consistent with sinusitis. Start doxycycline. Continue over-the-counter medications as needed for symptom relief and supportive care. Follow-up if symptoms worsen or fail to improve.       Relevant Medications   doxycycline (VIBRA-TABS) 100 MG tablet    Other Visit Diagnoses    Encounter for immunization    -  Primary

## 2015-08-01 NOTE — Patient Instructions (Signed)
Thank you for choosing [facility].  Summary/Instructions:  Your prescription(s) have been submitted to your pharmacy or been printed and provided for you. Please take as directed and contact our office if you believe you are having problem(s) with the medication(s) or have any questions.  If your symptoms worsen or fail to improve, please contact our office for further instruction, or in case of emergency go directly to the emergency room at the closest medical facility.    General Recommendations:    Please drink plenty of fluids.  Get plenty of rest   Sleep in humidified air  Use saline nasal sprays  Netti pot   OTC Medications:  Decongestants - helps relieve congestion   Flonase (generic fluticasone) or Nasacort (generic triamcinolone) - please make sure to use the "cross-over" technique at a 45 degree angle towards the opposite eye as opposed to straight up the nasal passageway.   If you have HIGH BLOOD PRESSURE - Coricidin HBP; AVOID any product that is -D as this contains pseudoephedrine which may increase your blood pressure.   Allergies - helps relieve runny nose, itchy eyes and sneezing   Claritin (generic loratidine), Allegra (fexofenidine), or Zyrtec (generic cyrterizine) for runny nose. These medications should not cause drowsiness.  Note - Benadryl (generic diphenhydramine) may be used however may cause drowsiness  Cough -   Delsym or Robitussin (generic dextromethorphan)  Expectorants - helps loosen mucus to ease removal   Mucinex (generic guaifenesin) as directed on the package.  Headaches / General Aches   Tylenol (generic acetaminophen) - DO NOT EXCEED 3 grams (3,000 mg) in a 24 hour time period  Advil/Motrin (generic ibuprofen)   Sore Throat -   Salt water gargle   Chloraseptic (generic benzocaine) spray or lozenges / Sucrets (generic dyclonine)

## 2015-08-01 NOTE — Progress Notes (Signed)
Pre visit review using our clinic review tool, if applicable. No additional management support is needed unless otherwise documented below in the visit note.    Flu shot given

## 2015-08-01 NOTE — Assessment & Plan Note (Signed)
Symptoms and exam consistent with sinusitis. Start doxycycline. Continue over-the-counter medications as needed for symptom relief and supportive care. Follow-up if symptoms worsen or fail to improve. 

## 2015-08-27 ENCOUNTER — Other Ambulatory Visit: Payer: Self-pay | Admitting: Internal Medicine

## 2015-10-11 DIAGNOSIS — Z01419 Encounter for gynecological examination (general) (routine) without abnormal findings: Secondary | ICD-10-CM | POA: Diagnosis not present

## 2015-10-11 DIAGNOSIS — Z6841 Body Mass Index (BMI) 40.0 and over, adult: Secondary | ICD-10-CM | POA: Diagnosis not present

## 2015-10-11 DIAGNOSIS — N958 Other specified menopausal and perimenopausal disorders: Secondary | ICD-10-CM | POA: Diagnosis not present

## 2015-10-11 DIAGNOSIS — Z13820 Encounter for screening for osteoporosis: Secondary | ICD-10-CM | POA: Diagnosis not present

## 2015-10-11 DIAGNOSIS — Z1231 Encounter for screening mammogram for malignant neoplasm of breast: Secondary | ICD-10-CM | POA: Diagnosis not present

## 2015-11-24 ENCOUNTER — Other Ambulatory Visit: Payer: Self-pay | Admitting: Internal Medicine

## 2015-11-27 ENCOUNTER — Encounter: Payer: Self-pay | Admitting: Gastroenterology

## 2015-12-14 ENCOUNTER — Other Ambulatory Visit: Payer: Self-pay | Admitting: Internal Medicine

## 2015-12-20 ENCOUNTER — Encounter: Payer: Self-pay | Admitting: Gastroenterology

## 2015-12-21 ENCOUNTER — Telehealth: Payer: Self-pay

## 2015-12-21 ENCOUNTER — Ambulatory Visit (INDEPENDENT_AMBULATORY_CARE_PROVIDER_SITE_OTHER): Payer: Medicare Other

## 2015-12-21 VITALS — BP 142/82 | Ht 65.0 in | Wt 282.8 lb

## 2015-12-21 DIAGNOSIS — Z0000 Encounter for general adult medical examination without abnormal findings: Secondary | ICD-10-CM | POA: Diagnosis not present

## 2015-12-21 DIAGNOSIS — Z1159 Encounter for screening for other viral diseases: Secondary | ICD-10-CM | POA: Diagnosis not present

## 2015-12-21 NOTE — Patient Instructions (Addendum)
Jennifer Gibson , Thank you for taking time to come for your Medicare Wellness Visit. I appreciate your ongoing commitment to your health goals. Please review the following plan we discussed and let me know if I can assist you in the future.   Will make apt with [facility] for labs and fup on BP meds  I will call and get mammogram and dexa scan results/     These are the goals we discussed: Goals    . Exercise 150 minutes per week (moderate activity)     [facility] minutes and will check on water aerobics        This is a list of the screening recommended for you and due dates:  Health Maintenance  Topic Date Due  .  Hepatitis C: One time screening is recommended by Center for Disease Control  (CDC) for  adults born from [birth year] through 1967.   [birth year]  . DEXA scan (bone density measurement)  12/02/2013  . Flu Shot  06/04/2016  . Mammogram  07/20/2016  . Colon Cancer Screening  11/20/2020  . Tetanus Vaccine  06/30/2022  . Shingles Vaccine  Completed  . Pneumonia vaccines  Completed   Low-Sodium Eating Plan Sodium raises blood pressure and causes water to be held in the body. Getting less sodium from food will help lower your blood pressure, reduce any swelling, and protect your heart, liver, and kidneys. We get sodium by adding salt (sodium chloride) to food. Most of our sodium comes from canned, boxed, and frozen foods. Restaurant foods, fast foods, and pizza are also very high in sodium. Even if you take medicine to lower your blood pressure or to reduce fluid in your body, getting less sodium from your food is important. WHAT IS MY PLAN? Most people should limit their sodium intake to 2,300 mg a day. Your health care provider recommends that you limit your sodium intake to __________ a day.  WHAT DO I NEED TO KNOW ABOUT THIS EATING PLAN? For the low-sodium eating plan, you will follow these general guidelines:  Choose foods with a % Daily Value for sodium of less  than 5% (as listed on the food label).   Use salt-free seasonings or herbs instead of table salt or sea salt.   Check with your health care provider or pharmacist before using salt substitutes.   Eat fresh foods.  Eat more vegetables and fruits.  Limit canned vegetables. If you do use them, rinse them well to decrease the sodium.   Limit cheese to 1 oz (28 g) per day.   Eat lower-sodium products, often labeled as "lower sodium" or "no salt added."  Avoid foods that contain monosodium glutamate (MSG). MSG is sometimes added to [place] food and some canned foods.  Check food labels (Nutrition Facts labels) on foods to learn how much sodium is in one serving.  Eat more home-cooked food and less restaurant, buffet, and fast food.  When eating at a restaurant, ask that your food be prepared with less salt, or no salt if possible.  HOW DO I READ FOOD LABELS FOR SODIUM INFORMATION? The Nutrition Facts label lists the amount of sodium in one serving of the food. If you eat more than one serving, you must multiply the listed amount of sodium by the number of servings. Food labels may also identify foods as:  Sodium free--Less than 5 mg in a serving.  Very low sodium--35 mg or less in a serving.  Low sodium--140 mg or less in a serving.  Light in sodium--50% less sodium in a serving. For example, if a food that usually has 300 mg of sodium is changed to become light in sodium, it will have 150 mg of sodium.  Reduced sodium--25% less sodium in a serving. For example, if a food that usually has 400 mg of sodium is changed to reduced sodium, it will have 300 mg of sodium. WHAT FOODS CAN I EAT? Grains Low-sodium cereals, including oats, puffed wheat and rice, and shredded wheat cereals. Low-sodium crackers. Unsalted rice and pasta. Lower-sodium bread.  Vegetables Frozen or fresh vegetables. Low-sodium or reduced-sodium canned vegetables. Low-sodium or reduced-sodium tomato sauce  and paste. Low-sodium or reduced-sodium tomato and vegetable juices.  Fruits Fresh, frozen, and canned fruit. Fruit juice.  Meat and Other Protein Products Low-sodium canned tuna and salmon. Fresh or frozen meat, poultry, seafood, and fish. Lamb. Unsalted nuts. Dried beans, peas, and lentils without added salt. Unsalted canned beans. Homemade soups without salt. Eggs.  Dairy Milk. Soy milk. Ricotta cheese. Low-sodium or reduced-sodium cheeses. Yogurt.  Condiments Fresh and dried herbs and spices. Salt-free seasonings. Onion and garlic powders. Low-sodium varieties of mustard and ketchup. Fresh or refrigerated horseradish. Lemon juice.  Fats and Oils Reduced-sodium salad dressings. Unsalted butter.  Other Unsalted popcorn and pretzels.  The items listed above may not be a complete list of recommended foods or beverages. Contact your dietitian for more options. WHAT FOODS ARE NOT RECOMMENDED? Grains Instant hot cereals. Bread stuffing, pancake, and biscuit mixes. Croutons. Seasoned rice or pasta mixes. Noodle soup cups. Boxed or frozen macaroni and cheese. Self-rising flour. Regular salted crackers. Vegetables Regular canned vegetables. Regular canned tomato sauce and paste. Regular tomato and vegetable juices. Frozen vegetables in sauces. Salted [place] fries. Olives. Angie Fava. Relishes. Sauerkraut. Salsa. Meat and Other Protein Products Salted, canned, smoked, spiced, or pickled meats, seafood, or fish. Bacon, ham, sausage, hot dogs, corned beef, chipped beef, and packaged luncheon meats. Salt pork. Jerky. Pickled herring. Anchovies, regular canned tuna, and sardines. Salted nuts. Dairy Processed cheese and cheese spreads. Cheese curds. Blue cheese and cottage cheese. Buttermilk.  Condiments Onion and garlic salt, seasoned salt, table salt, and sea salt. Canned and packaged gravies. Worcestershire sauce. Tartar sauce. Barbecue sauce. Teriyaki sauce. Soy sauce, including reduced  sodium. Steak sauce. Fish sauce. Oyster sauce. Cocktail sauce. Horseradish that you find on the shelf. Regular ketchup and mustard. Meat flavorings and tenderizers. Bouillon cubes. Hot sauce. Tabasco sauce. Marinades. Taco seasonings. Relishes. Fats and Oils Regular salad dressings. Salted butter. Margarine. Ghee. Bacon fat.  Other Potato and tortilla chips. Corn chips and puffs. Salted popcorn and pretzels. Canned or dried soups. Pizza. Frozen entrees and pot pies.  The items listed above may not be a complete list of foods and beverages to avoid. Contact your dietitian for more information.   This information is not intended to replace advice given to you by your health care provider. Make sure you discuss any questions you have with your health care provider.   Document Released: 04/12/2002 Document Revised: 11/11/2014 Document Reviewed: 08/25/2013 Elsevier Interactive Patient Education 2016 [place] in the Home  Falls can cause injuries. They can happen to people of all ages. There are many things you can do to make your home safe and to help prevent falls.  WHAT CAN I DO ON THE OUTSIDE OF MY HOME?  Regularly fix the edges of walkways and driveways and fix any cracks.  Remove anything that might make you trip as you walk through a door, such as a raised step or threshold.  Trim any bushes or trees on the path to your home.  Use bright outdoor lighting.  Clear any walking paths of anything that might make someone trip, such as rocks or tools.  Regularly check to see if handrails are loose or broken. Make sure that both sides of any steps have handrails.  Any raised decks and porches should have guardrails on the edges.  Have any leaves, snow, or ice cleared regularly.  Use sand or salt on walking paths during winter.  Clean up any spills in your garage right away. This includes oil or grease spills. WHAT CAN I DO IN THE BATHROOM?   Use night  lights.  Install grab bars by the toilet and in the tub and shower. Do not use towel bars as grab bars.  Use non-skid mats or decals in the tub or shower.  If you need to sit down in the shower, use a plastic, non-slip stool.  Keep the floor dry. Clean up any water that spills on the floor as soon as it happens.  Remove soap buildup in the tub or shower regularly.  Attach bath mats securely with double-sided non-slip rug tape.  Do not have throw rugs and other things on the floor that can make you trip. WHAT CAN I DO IN THE BEDROOM?  Use night lights.  Make sure that you have a light by your bed that is easy to reach.  Do not use any sheets or blankets that are too big for your bed. They should not hang down onto the floor.  Have a firm chair that has side arms. You can use this for support while you get dressed.  Do not have throw rugs and other things on the floor that can make you trip. WHAT CAN I DO IN THE KITCHEN?  Clean up any spills right away.  Avoid walking on wet floors.  Keep items that you use a lot in easy-to-reach places.  If you need to reach something above you, use a strong step stool that has a grab bar.  Keep electrical cords out of the way.  Do not use floor polish or wax that makes floors slippery. If you must use wax, use non-skid floor wax.  Do not have throw rugs and other things on the floor that can make you trip. WHAT CAN I DO WITH MY STAIRS?  Do not leave any items on the stairs.  Make sure that there are handrails on both sides of the stairs and use them. Fix handrails that are broken or loose. Make sure that handrails are as long as the stairways.  Check any carpeting to make sure that it is firmly attached to the stairs. Fix any carpet that is loose or worn.  Avoid having throw rugs at the top or bottom of the stairs. If you do have throw rugs, attach them to the floor with carpet tape.  Make sure that you have a light switch at the  top of the stairs and the bottom of the stairs. If you do not have them, ask someone to add them for you. WHAT ELSE CAN I DO TO HELP PREVENT FALLS?  Wear shoes that:  Do not have high heels.  Have rubber bottoms.  Are comfortable and fit you well.  Are closed at the toe. Do not wear sandals.  If you use a stepladder:  Make sure that it is fully opened. Do not climb a closed stepladder.  Make sure that both sides of the stepladder are locked into place.  Ask someone to hold it for you, if possible.  Clearly mark and make sure that you can see:  Any grab bars or handrails.  First and last steps.  Where the edge of each step is.  Use tools that help you move around (mobility aids) if they are needed. These include:  Canes.  Walkers.  Scooters.  Crutches.  Turn on the lights when you go into a dark area. Replace any light bulbs as soon as they burn out.  Set up your furniture so you have a clear path. Avoid moving your furniture around.  If any of your floors are uneven, fix them.  If there are any pets around you, be aware of where they are.  Review your medicines with your doctor. Some medicines can make you feel dizzy. This can increase your chance of falling. Ask your doctor what other things that you can do to help prevent falls.   This information is not intended to replace advice given to you by your health care provider. Make sure you discuss any questions you have with your health care provider.   Document Released: 08/17/2009 Document Revised: 03/07/2015 Document Reviewed: 11/25/2014 Elsevier Interactive Patient Education 2016 [place] Maintenance, Female Adopting a healthy lifestyle and getting preventive care can go a long way to promote health and wellness. Talk with your health care provider about what schedule of regular examinations is right for you. This is a good chance for you to check in with your provider about disease prevention  and staying healthy. In between checkups, there are plenty of things you can do on your own. Experts have done a lot of research about which lifestyle changes and preventive measures are most likely to keep you healthy. Ask your health care provider for more information. WEIGHT AND DIET  Eat a healthy diet  Be sure to include plenty of vegetables, fruits, low-fat dairy products, and lean protein.  Do not eat a lot of foods high in solid fats, added sugars, or salt.  Get regular exercise. This is one of the most important things you can do for your health.  Most adults should exercise for at least 150 minutes each week. The exercise should increase your heart rate and make you sweat (moderate-intensity exercise).  Most adults should also do strengthening exercises at least twice a week. This is in addition to the moderate-intensity exercise.  Maintain a healthy weight  Body mass index (BMI) is a measurement that can be used to identify possible weight problems. It estimates body fat based on height and weight. Your health care provider can help determine your BMI and help you achieve or maintain a healthy weight.  For females 19 years of age and older:   A BMI below 18.5 is considered underweight.  A BMI of 18.5 to 24.9 is normal.  A BMI of 25 to 29.9 is considered overweight.  A BMI of 30 and above is considered obese.  Watch levels of cholesterol and blood lipids  You should start having your blood tested for lipids and cholesterol at 67 years of age, then have this test every 5 years.  You may need to have your cholesterol levels checked more often if:  Your lipid or cholesterol levels are high.  You are older than 67 years of age.  You are  at high risk for heart disease.  CANCER SCREENING   Lung Cancer  Lung cancer screening is recommended for adults 72-41 years old who are at high risk for lung cancer because of a history of smoking.  A yearly low-dose CT scan of  the lungs is recommended for people who:  Currently smoke.  Have quit within the past 15 years.  Have at least a 30-pack-year history of smoking. A pack year is smoking an average of one pack of cigarettes a day for 1 year.  Yearly screening should continue until it has been 15 years since you quit.  Yearly screening should stop if you develop a health problem that would prevent you from having lung cancer treatment.  Breast Cancer  Practice breast self-awareness. This means understanding how your breasts normally appear and feel.  It also means doing regular breast self-exams. Let your health care provider know about any changes, no matter how small.  If you are in your 20s or 30s, you should have a clinical breast exam (CBE) by a health care provider every 1-3 years as part of a regular health exam.  If you are 68 or older, have a CBE every year. Also consider having a breast X-ray (mammogram) every year.  If you have a family history of breast cancer, talk to your health care provider about genetic screening.  If you are at high risk for breast cancer, talk to your health care provider about having an MRI and a mammogram every year.  Breast cancer gene (BRCA) assessment is recommended for women who have family members with BRCA-related cancers. BRCA-related cancers include:  Breast.  Ovarian.  Tubal.  Peritoneal cancers.  Results of the assessment will determine the need for genetic counseling and BRCA1 and BRCA2 testing. Cervical Cancer Your health care provider may recommend that you be screened regularly for cancer of the pelvic organs (ovaries, uterus, and vagina). This screening involves a pelvic examination, including checking for microscopic changes to the surface of your cervix (Pap test). You may be encouraged to have this screening done every 3 years, beginning at age 72.  For women ages 62-65, health care providers may recommend pelvic exams and Pap testing every  3 years, or they may recommend the Pap and pelvic exam, combined with testing for human papilloma virus (HPV), every 5 years. Some types of HPV increase your risk of cervical cancer. Testing for HPV may also be done on women of any age with unclear Pap test results.  Other health care providers may not recommend any screening for nonpregnant women who are considered low risk for pelvic cancer and who do not have symptoms. Ask your health care provider if a screening pelvic exam is right for you.  If you have had past treatment for cervical cancer or a condition that could lead to cancer, you need Pap tests and screening for cancer for at least 20 years after your treatment. If Pap tests have been discontinued, your risk factors (such as having a new sexual partner) need to be reassessed to determine if screening should resume. Some women have medical problems that increase the chance of getting cervical cancer. In these cases, your health care provider may recommend more frequent screening and Pap tests. Colorectal Cancer  This type of cancer can be detected and often prevented.  Routine colorectal cancer screening usually begins at 67 years of age and continues through 67 years of age.  Your health care provider may recommend screening at an  earlier age if you have risk factors for colon cancer.  Your health care provider may also recommend using home test kits to check for hidden blood in the stool.  A small camera at the end of a tube can be used to examine your colon directly (sigmoidoscopy or colonoscopy). This is done to check for the earliest forms of colorectal cancer.  Routine screening usually begins at age 73.  Direct examination of the colon should be repeated every 5-10 years through 67 years of age. However, you may need to be screened more often if early forms of precancerous polyps or small growths are found. Skin Cancer  Check your skin from head to toe regularly.  Tell your  health care provider about any new moles or changes in moles, especially if there is a change in a mole's shape or color.  Also tell your health care provider if you have a mole that is larger than the size of a pencil eraser.  Always use sunscreen. Apply sunscreen liberally and repeatedly throughout the day.  Protect yourself by wearing long sleeves, pants, a wide-brimmed hat, and sunglasses whenever you are outside. HEART DISEASE, DIABETES, AND HIGH BLOOD PRESSURE   High blood pressure causes heart disease and increases the risk of stroke. High blood pressure is more likely to develop in:  People who have blood pressure in the high end of the normal range (130-139/85-89 mm Hg).  People who are overweight or obese.  People who are African American.  If you are 55-35 years of age, have your blood pressure checked every 3-5 years. If you are 44 years of age or older, have your blood pressure checked every year. You should have your blood pressure measured twice--once when you are at a hospital or clinic, and once when you are not at a hospital or clinic. Record the average of the two measurements. To check your blood pressure when you are not at a hospital or clinic, you can use:  An automated blood pressure machine at a pharmacy.  A home blood pressure monitor.  If you are between 72 years and 43 years old, ask your health care provider if you should take aspirin to prevent strokes.  Have regular diabetes screenings. This involves taking a blood sample to check your fasting blood sugar level.  If you are at a normal weight and have a low risk for diabetes, have this test once every three years after 67 years of age.  If you are overweight and have a high risk for diabetes, consider being tested at a younger age or more often. PREVENTING INFECTION  Hepatitis B  If you have a higher risk for hepatitis B, you should be screened for this virus. You are considered at high risk for  hepatitis B if:  You were born in a country where hepatitis B is common. Ask your health care provider which countries are considered high risk.  Your parents were born in a high-risk country, and you have not been immunized against hepatitis B (hepatitis B vaccine).  You have HIV or AIDS.  You use needles to inject street drugs.  You live with someone who has hepatitis B.  You have had sex with someone who has hepatitis B.  You get hemodialysis treatment.  You take certain medicines for conditions, including cancer, organ transplantation, and autoimmune conditions. Hepatitis C  Blood testing is recommended for:  Everyone born from [birth year] through 1967.  Anyone with known risk factors for hepatitis C.  Sexually transmitted infections (STIs)  You should be screened for sexually transmitted infections (STIs) including gonorrhea and chlamydia if:  You are sexually active and are younger than 67 years of age.  You are older than 67 years of age and your health care provider tells you that you are at risk for this type of infection.  Your sexual activity has changed since you were last screened and you are at an increased risk for chlamydia or gonorrhea. Ask your health care provider if you are at risk.  If you do not have HIV, but are at risk, it may be recommended that you take a prescription medicine daily to prevent HIV infection. This is called pre-exposure prophylaxis (PrEP). You are considered at risk if:  You are sexually active and do not regularly use condoms or know the HIV status of your partner(s).  You take drugs by injection.  You are sexually active with a partner who has HIV. Talk with your health care provider about whether you are at high risk of being infected with HIV. If you choose to begin PrEP, you should first be tested for HIV. You should then be tested every 3 months for as long as you are taking PrEP.  PREGNANCY   If you are premenopausal and you may  become pregnant, ask your health care provider about preconception counseling.  If you may become pregnant, take 400 to 800 micrograms (mcg) of folic acid every day.  If you want to prevent pregnancy, talk to your health care provider about birth control (contraception). OSTEOPOROSIS AND MENOPAUSE   Osteoporosis is a disease in which the bones lose minerals and strength with aging. This can result in serious bone fractures. Your risk for osteoporosis can be identified using a bone density scan.  If you are 75 years of age or older, or if you are at risk for osteoporosis and fractures, ask your health care provider if you should be screened.  Ask your health care provider whether you should take a calcium or vitamin D supplement to lower your risk for osteoporosis.  Menopause may have certain physical symptoms and risks.  Hormone replacement therapy may reduce some of these symptoms and risks. Talk to your health care provider about whether hormone replacement therapy is right for you.  HOME CARE INSTRUCTIONS   Schedule regular health, dental, and eye exams.  Stay current with your immunizations.   Do not use any tobacco products including cigarettes, chewing tobacco, or electronic cigarettes.  If you are pregnant, do not drink alcohol.  If you are breastfeeding, limit how much and how often you drink alcohol.  Limit alcohol intake to no more than 1 drink per day for nonpregnant women. One drink equals 12 ounces of beer, 5 ounces of wine, or 1 ounces of hard liquor.  Do not use street drugs.  Do not share needles.  Ask your health care provider for help if you need support or information about quitting drugs.  Tell your health care provider if you often feel depressed.  Tell your health care provider if you have ever been abused or do not feel safe at home.   This information is not intended to replace advice given to you by your health care provider. Make sure you discuss  any questions you have with your health care provider.   Document Released: 05/06/2011 Document Revised: 11/11/2014 Document Reviewed: 09/22/2013 Elsevier Interactive Patient Education 2016 [place] Maintenance, Female Adopting a healthy lifestyle and getting  preventive care can go a long way to promote health and wellness. Talk with your health care provider about what schedule of regular examinations is right for you. This is a good chance for you to check in with your provider about disease prevention and staying healthy. In between checkups, there are plenty of things you can do on your own. Experts have done a lot of research about which lifestyle changes and preventive measures are most likely to keep you healthy. Ask your health care provider for more information. WEIGHT AND DIET  Eat a healthy diet  Be sure to include plenty of vegetables, fruits, low-fat dairy products, and lean protein.  Do not eat a lot of foods high in solid fats, added sugars, or salt.  Get regular exercise. This is one of the most important things you can do for your health.  Most adults should exercise for at least 150 minutes each week. The exercise should increase your heart rate and make you sweat (moderate-intensity exercise).  Most adults should also do strengthening exercises at least twice a week. This is in addition to the moderate-intensity exercise.  Maintain a healthy weight  Body mass index (BMI) is a measurement that can be used to identify possible weight problems. It estimates body fat based on height and weight. Your health care provider can help determine your BMI and help you achieve or maintain a healthy weight.  For females 7 years of age and older:   A BMI below 18.5 is considered underweight.  A BMI of 18.5 to 24.9 is normal.  A BMI of 25 to 29.9 is considered overweight.  A BMI of 30 and above is considered obese.  Watch levels of cholesterol and blood  lipids  You should start having your blood tested for lipids and cholesterol at 67 years of age, then have this test every 5 years.  You may need to have your cholesterol levels checked more often if:  Your lipid or cholesterol levels are high.  You are older than 67 years of age.  You are at high risk for heart disease.  CANCER SCREENING   Lung Cancer  Lung cancer screening is recommended for adults 17-32 years old who are at high risk for lung cancer because of a history of smoking.  A yearly low-dose CT scan of the lungs is recommended for people who:  Currently smoke.  Have quit within the past 15 years.  Have at least a 30-pack-year history of smoking. A pack year is smoking an average of one pack of cigarettes a day for 1 year.  Yearly screening should continue until it has been 15 years since you quit.  Yearly screening should stop if you develop a health problem that would prevent you from having lung cancer treatment.  Breast Cancer  Practice breast self-awareness. This means understanding how your breasts normally appear and feel.  It also means doing regular breast self-exams. Let your health care provider know about any changes, no matter how small.  If you are in your 20s or 30s, you should have a clinical breast exam (CBE) by a health care provider every 1-3 years as part of a regular health exam.  If you are 74 or older, have a CBE every year. Also consider having a breast X-ray (mammogram) every year.  If you have a family history of breast cancer, talk to your health care provider about genetic screening.  If you are at high risk for breast cancer, talk to  your health care provider about having an MRI and a mammogram every year.  Breast cancer gene (BRCA) assessment is recommended for women who have family members with BRCA-related cancers. BRCA-related cancers include:  Breast.  Ovarian.  Tubal.  Peritoneal cancers.  Results of the assessment  will determine the need for genetic counseling and BRCA1 and BRCA2 testing. Cervical Cancer Your health care provider may recommend that you be screened regularly for cancer of the pelvic organs (ovaries, uterus, and vagina). This screening involves a pelvic examination, including checking for microscopic changes to the surface of your cervix (Pap test). You may be encouraged to have this screening done every 3 years, beginning at age 18.  For women ages 52-65, health care providers may recommend pelvic exams and Pap testing every 3 years, or they may recommend the Pap and pelvic exam, combined with testing for human papilloma virus (HPV), every 5 years. Some types of HPV increase your risk of cervical cancer. Testing for HPV may also be done on women of any age with unclear Pap test results.  Other health care providers may not recommend any screening for nonpregnant women who are considered low risk for pelvic cancer and who do not have symptoms. Ask your health care provider if a screening pelvic exam is right for you.  If you have had past treatment for cervical cancer or a condition that could lead to cancer, you need Pap tests and screening for cancer for at least 20 years after your treatment. If Pap tests have been discontinued, your risk factors (such as having a new sexual partner) need to be reassessed to determine if screening should resume. Some women have medical problems that increase the chance of getting cervical cancer. In these cases, your health care provider may recommend more frequent screening and Pap tests. Colorectal Cancer  This type of cancer can be detected and often prevented.  Routine colorectal cancer screening usually begins at 67 years of age and continues through 67 years of age.  Your health care provider may recommend screening at an earlier age if you have risk factors for colon cancer.  Your health care provider may also recommend using home test kits to check  for hidden blood in the stool.  A small camera at the end of a tube can be used to examine your colon directly (sigmoidoscopy or colonoscopy). This is done to check for the earliest forms of colorectal cancer.  Routine screening usually begins at age 49.  Direct examination of the colon should be repeated every 5-10 years through 67 years of age. However, you may need to be screened more often if early forms of precancerous polyps or small growths are found. Skin Cancer  Check your skin from head to toe regularly.  Tell your health care provider about any new moles or changes in moles, especially if there is a change in a mole's shape or color.  Also tell your health care provider if you have a mole that is larger than the size of a pencil eraser.  Always use sunscreen. Apply sunscreen liberally and repeatedly throughout the day.  Protect yourself by wearing long sleeves, pants, a wide-brimmed hat, and sunglasses whenever you are outside. HEART DISEASE, DIABETES, AND HIGH BLOOD PRESSURE   High blood pressure causes heart disease and increases the risk of stroke. High blood pressure is more likely to develop in:  People who have blood pressure in the high end of the normal range (130-139/85-89 mm Hg).  People  who are overweight or obese.  People who are African American.  If you are 23-42 years of age, have your blood pressure checked every 3-5 years. If you are 48 years of age or older, have your blood pressure checked every year. You should have your blood pressure measured twice--once when you are at a hospital or clinic, and once when you are not at a hospital or clinic. Record the average of the two measurements. To check your blood pressure when you are not at a hospital or clinic, you can use:  An automated blood pressure machine at a pharmacy.  A home blood pressure monitor.  If you are between 38 years and 57 years old, ask your health care provider if you should take  aspirin to prevent strokes.  Have regular diabetes screenings. This involves taking a blood sample to check your fasting blood sugar level.  If you are at a normal weight and have a low risk for diabetes, have this test once every three years after 67 years of age.  If you are overweight and have a high risk for diabetes, consider being tested at a younger age or more often. PREVENTING INFECTION  Hepatitis B  If you have a higher risk for hepatitis B, you should be screened for this virus. You are considered at high risk for hepatitis B if:  You were born in a country where hepatitis B is common. Ask your health care provider which countries are considered high risk.  Your parents were born in a high-risk country, and you have not been immunized against hepatitis B (hepatitis B vaccine).  You have HIV or AIDS.  You use needles to inject street drugs.  You live with someone who has hepatitis B.  You have had sex with someone who has hepatitis B.  You get hemodialysis treatment.  You take certain medicines for conditions, including cancer, organ transplantation, and autoimmune conditions. Hepatitis C  Blood testing is recommended for:  Everyone born from [birth year] through 1967.  Anyone with known risk factors for hepatitis C. Sexually transmitted infections (STIs)  You should be screened for sexually transmitted infections (STIs) including gonorrhea and chlamydia if:  You are sexually active and are younger than 67 years of age.  You are older than 67 years of age and your health care provider tells you that you are at risk for this type of infection.  Your sexual activity has changed since you were last screened and you are at an increased risk for chlamydia or gonorrhea. Ask your health care provider if you are at risk.  If you do not have HIV, but are at risk, it may be recommended that you take a prescription medicine daily to prevent HIV infection. This is called  pre-exposure prophylaxis (PrEP). You are considered at risk if:  You are sexually active and do not regularly use condoms or know the HIV status of your partner(s).  You take drugs by injection.  You are sexually active with a partner who has HIV. Talk with your health care provider about whether you are at high risk of being infected with HIV. If you choose to begin PrEP, you should first be tested for HIV. You should then be tested every 3 months for as long as you are taking PrEP.  PREGNANCY   If you are premenopausal and you may become pregnant, ask your health care provider about preconception counseling.  If you may become pregnant, take 400 to 800 micrograms (mcg) of  folic acid every day.  If you want to prevent pregnancy, talk to your health care provider about birth control (contraception). OSTEOPOROSIS AND MENOPAUSE   Osteoporosis is a disease in which the bones lose minerals and strength with aging. This can result in serious bone fractures. Your risk for osteoporosis can be identified using a bone density scan.  If you are 68 years of age or older, or if you are at risk for osteoporosis and fractures, ask your health care provider if you should be screened.  Ask your health care provider whether you should take a calcium or vitamin D supplement to lower your risk for osteoporosis.  Menopause may have certain physical symptoms and risks.  Hormone replacement therapy may reduce some of these symptoms and risks. Talk to your health care provider about whether hormone replacement therapy is right for you.  HOME CARE INSTRUCTIONS   Schedule regular health, dental, and eye exams.  Stay current with your immunizations.   Do not use any tobacco products including cigarettes, chewing tobacco, or electronic cigarettes.  If you are pregnant, do not drink alcohol.  If you are breastfeeding, limit how much and how often you drink alcohol.  Limit alcohol intake to no more than 1  drink per day for nonpregnant women. One drink equals 12 ounces of beer, 5 ounces of wine, or 1 ounces of hard liquor.  Do not use street drugs.  Do not share needles.  Ask your health care provider for help if you need support or information about quitting drugs.  Tell your health care provider if you often feel depressed.  Tell your health care provider if you have ever been abused or do not feel safe at home.   This information is not intended to replace advice given to you by your health care provider. Make sure you discuss any questions you have with your health care provider.   Document Released: 05/06/2011 Document Revised: 11/11/2014 Document Reviewed: 09/22/2013 Elsevier Interactive Patient Education [employer].

## 2015-12-21 NOTE — Telephone Encounter (Signed)
Patient in for AWV; Last OV and labs ordered 03/11/14; agrees to schedule post colonoscopy in March for annual labs and BP review Please order future labs as she would like to have these drawn prior to her apt (TBS)   Tks
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
## 2015-12-25 NOTE — Telephone Encounter (Signed)
OK CBC, CMET, Lipids, A1c, TSH, UA Thx

## 2016-01-01 ENCOUNTER — Other Ambulatory Visit: Payer: Self-pay

## 2016-01-01 DIAGNOSIS — I10 Essential (primary) hypertension: Secondary | ICD-10-CM

## 2016-01-01 DIAGNOSIS — E041 Nontoxic single thyroid nodule: Secondary | ICD-10-CM

## 2016-01-01 DIAGNOSIS — R739 Hyperglycemia, unspecified: Secondary | ICD-10-CM

## 2016-02-01 ENCOUNTER — Ambulatory Visit (AMBULATORY_SURGERY_CENTER): Payer: Self-pay | Admitting: *Deleted

## 2016-02-01 VITALS — Ht 65.0 in | Wt 280.0 lb

## 2016-02-01 DIAGNOSIS — Z86010 Personal history of colonic polyps: Secondary | ICD-10-CM

## 2016-02-01 MED ORDER — NA SULFATE-K SULFATE-MG SULF 17.5-3.13-1.6 GM/177ML PO SOLN: 1.0000 | Freq: Once | ORAL | Status: DC

## 2016-02-01 NOTE — Progress Notes (Signed)
No egg or soy allergy known to patient  No issues with past sedation with any surgeries  or procedures, no intubation problems  No diet pills per patient No home 02 use per patient  No blood thinners per patient  Pt denies issues with constipation   

## 2016-02-15 ENCOUNTER — Encounter: Payer: Self-pay | Admitting: Gastroenterology

## 2016-02-15 ENCOUNTER — Ambulatory Visit (AMBULATORY_SURGERY_CENTER): Payer: Medicare Other | Admitting: Gastroenterology

## 2016-02-15 VITALS — BP 138/41 | HR 66 | Temp 97.5°F | Resp 17 | Ht 65.0 in | Wt 280.0 lb

## 2016-02-15 DIAGNOSIS — K635 Polyp of colon: Secondary | ICD-10-CM

## 2016-02-15 DIAGNOSIS — G4730 Sleep apnea, unspecified: Secondary | ICD-10-CM | POA: Diagnosis not present

## 2016-02-15 DIAGNOSIS — I10 Essential (primary) hypertension: Secondary | ICD-10-CM | POA: Diagnosis not present

## 2016-02-15 DIAGNOSIS — D120 Benign neoplasm of cecum: Secondary | ICD-10-CM | POA: Diagnosis not present

## 2016-02-15 DIAGNOSIS — D123 Benign neoplasm of transverse colon: Secondary | ICD-10-CM | POA: Diagnosis not present

## 2016-02-15 DIAGNOSIS — D125 Benign neoplasm of sigmoid colon: Secondary | ICD-10-CM | POA: Diagnosis not present

## 2016-02-15 DIAGNOSIS — D649 Anemia, unspecified: Secondary | ICD-10-CM | POA: Diagnosis not present

## 2016-02-15 DIAGNOSIS — Z86010 Personal history of colonic polyps: Secondary | ICD-10-CM

## 2016-02-15 DIAGNOSIS — Z1211 Encounter for screening for malignant neoplasm of colon: Secondary | ICD-10-CM | POA: Diagnosis not present

## 2016-02-15 DIAGNOSIS — D124 Benign neoplasm of descending colon: Secondary | ICD-10-CM

## 2016-02-15 MED ORDER — SODIUM CHLORIDE 0.9 % IV SOLN: 500.0000 mL | INTRAVENOUS | Status: DC

## 2016-02-15 NOTE — Progress Notes (Signed)
To recovery, report to Tyrell, RN, VSS. 

## 2016-02-15 NOTE — Patient Instructions (Signed)
YOU HAD AN ENDOSCOPIC PROCEDURE TODAY AT THE [facility]:   Refer to the procedure report that was given to you for any specific questions about what was found during the examination.  If the procedure report does not answer your questions, please call your gastroenterologist to clarify.  If you requested that your care partner not be given the details of your procedure findings, then the procedure report has been included in a sealed envelope for you to review at your convenience later.  YOU SHOULD EXPECT: Some feelings of bloating in the abdomen. Passage of more gas than usual.  Walking can help get rid of the air that was put into your GI tract during the procedure and reduce the bloating. If you had a lower endoscopy (such as a colonoscopy or flexible sigmoidoscopy) you may notice spotting of blood in your stool or on the toilet paper. If you underwent a bowel prep for your procedure, you may not have a normal bowel movement for a few days.  Please Note:  You might notice some irritation and congestion in your nose or some drainage.  This is from the oxygen used during your procedure.  There is no need for concern and it should clear up in a day or so.  SYMPTOMS TO REPORT IMMEDIATELY:   Following lower endoscopy (colonoscopy or flexible sigmoidoscopy):  Excessive amounts of blood in the stool  Significant tenderness or worsening of abdominal pains  Swelling of the abdomen that is new, acute  Fever of 100F or higher   For urgent or emergent issues, a gastroenterologist can be reached at any hour by calling [phone].   DIET: Your first meal following the procedure should be a small meal and then it is ok to progress to your normal diet. Heavy or fried foods are harder to digest and may make you feel nauseous or bloated.  Likewise, meals heavy in dairy and vegetables can increase bloating.  Drink plenty of fluids but you should avoid alcoholic beverages for 24  hours.  ACTIVITY:  You should plan to take it easy for the rest of today and you should NOT DRIVE or use heavy machinery until tomorrow (because of the sedation medicines used during the test).    FOLLOW UP: Our staff will call the number listed on your records the next business day following your procedure to check on you and address any questions or concerns that you may have regarding the information given to you following your procedure. If we do not reach you, we will leave a message.  However, if you are feeling well and you are not experiencing any problems, there is no need to return our call.  We will assume that you have returned to your regular daily activities without incident.  If any biopsies were taken you will be contacted by phone or by letter within the next 1-3 weeks.  Please call us at [phone] if you have not heard about the biopsies in 3 weeks.    SIGNATURES/CONFIDENTIALITY: You and/or your care partner have signed paperwork which will be entered into your electronic medical record.  These signatures attest to the fact that that the information above on your After Visit Summary has been reviewed and is understood.  Full responsibility of the confidentiality of this discharge information lies with you and/or your care-partner.     Handout was given to your care partner on polyps. You may resume your current medications today. Await biopsy results. Please call   if any questions or concerns.   

## 2016-02-15 NOTE — Progress Notes (Signed)
Called to room to assist during endoscopic procedure.  Patient ID and intended procedure confirmed with present staff. Received instructions for my participation in the procedure from the performing physician.  

## 2016-02-15 NOTE — Op Note (Signed)
[facility] Patient Name: Jennifer Gibson Procedure Date: 02/15/2016 11:40 AM MRN: [id number] Endoscopist: Ladene Artist , MD Age: 65 Date of Birth: 07-Jul-1951 Gender: Female Procedure:                Colonoscopy Indications:              Surveillance: Personal history of adenomatous                            polyps on last colonoscopy > 5 years ago Medicines:                Monitored Anesthesia Care Procedure:                Pre-Anesthesia Assessment:                           - Prior to the procedure, a History and Physical                            was performed, and patient medications and                            allergies were reviewed. The patient's tolerance of                            previous anesthesia was also reviewed. The risks                            and benefits of the procedure and the sedation                            options and risks were discussed with the patient.                            All questions were answered, and informed consent                            was obtained. Prior Anticoagulants: The patient has                            taken no previous anticoagulant or antiplatelet                            agents. ASA Grade Assessment: II - A patient with                            mild systemic disease. After reviewing the risks                            and benefits, the patient was deemed in                            satisfactory condition to undergo the procedure.  After obtaining informed consent, the colonoscope                            was passed under direct vision. Throughout the                            procedure, the patient's blood pressure, pulse, and                            oxygen saturations were monitored continuously. The                            Model PCF-H190L [id number]) scope was introduced                            through the anus and advanced to the the cecum,                 identified by appendiceal orifice and ileocecal                            valve. The colonoscopy was performed without                            difficulty. The patient tolerated the procedure                            well. The quality of the bowel preparation was                            excellent. The ileocecal valve, appendiceal                            orifice, and rectum were photographed. Scope In: 11:51:21 AM Scope Out: 12:03:50 PM Scope Withdrawal Time: 0 hours 10 minutes 57 seconds  Total Procedure Duration: 0 hours 12 minutes 29 seconds  Findings:                 The digital rectal exam was normal.                           Three sessile polyps were found in the sigmoid                            colon, transverse colon and cecum. The polyps were                            3 to 4 mm in size. These polyps were removed with a                            cold biopsy forceps. Resection and retrieval were                            complete.  A 7 mm polyp was found in the descending colon. The                            polyp was sessile. The polyp was removed with a                            cold snare. Resection and retrieval were complete.                           The exam was otherwise normal throughout the                            examined colon.                           The retroflexed view of the distal rectum and anal                            verge was normal and showed no anal or rectal                            abnormalities. Complications:            No immediate complications. Estimated Blood Loss:     Estimated blood loss: none. Impression:               - Three 3 to 4 mm polyps in the sigmoid colon, in                            the transverse colon and in the cecum, removed with                            a cold biopsy forceps. Resected and retrieved.                           - One 7 mm polyp in the descending  colon, removed                            with a cold snare. Resected and retrieved. Recommendation:           - Patient has a contact number available for                            emergencies. The signs and symptoms of potential                            delayed complications were discussed with the                            patient. Return to normal activities tomorrow.                            Written discharge instructions were provided to the  patient.                           - Resume previous diet.                           - Continue present medications.                           - Await pathology results.                           - Repeat colonoscopy in 5 years for surveillance. Ladene Artist, MD 02/15/2016 12:09:16 PM This report has been signed electronically.

## 2016-02-19 ENCOUNTER — Telehealth: Payer: Self-pay | Admitting: *Deleted

## 2016-02-19 NOTE — Telephone Encounter (Signed)
  Follow up Call-  Call back number 02/15/2016  Post procedure Call Back phone  # [phone] hm  Permission to leave phone message Yes     Patient questions:  Do you have a fever, pain , or abdominal swelling? No. Pain Score  0 *  Have you tolerated food without any problems? Yes.    Have you been able to return to your normal activities? Yes.    Do you have any questions about your discharge instructions: Diet   No. Medications  No. Follow up visit  No.  Do you have questions or concerns about your Care? No.  Actions: * If pain score is 4 or above: No action needed, pain <4.

## 2016-02-26 ENCOUNTER — Encounter: Payer: Self-pay | Admitting: Gastroenterology

## 2016-02-27 ENCOUNTER — Encounter: Payer: Self-pay | Admitting: Internal Medicine

## 2016-02-27 ENCOUNTER — Ambulatory Visit (INDEPENDENT_AMBULATORY_CARE_PROVIDER_SITE_OTHER): Payer: Medicare Other | Admitting: Internal Medicine

## 2016-02-27 ENCOUNTER — Other Ambulatory Visit (INDEPENDENT_AMBULATORY_CARE_PROVIDER_SITE_OTHER): Payer: Medicare Other

## 2016-02-27 VITALS — BP 138/80 | HR 85 | Wt 282.0 lb

## 2016-02-27 DIAGNOSIS — K2100 Gastro-esophageal reflux disease with esophagitis, without bleeding: Secondary | ICD-10-CM

## 2016-02-27 DIAGNOSIS — E785 Hyperlipidemia, unspecified: Secondary | ICD-10-CM

## 2016-02-27 DIAGNOSIS — I10 Essential (primary) hypertension: Secondary | ICD-10-CM

## 2016-02-27 DIAGNOSIS — I872 Venous insufficiency (chronic) (peripheral): Secondary | ICD-10-CM

## 2016-02-27 DIAGNOSIS — K219 Gastro-esophageal reflux disease without esophagitis: Secondary | ICD-10-CM | POA: Insufficient documentation

## 2016-02-27 LAB — CBC WITH DIFFERENTIAL/PLATELET
BASOS ABS: 0 10*3/uL (ref 0.0–0.1)
BASOS PCT: 0.5 % (ref 0.0–3.0)
EOS ABS: 0.1 10*3/uL (ref 0.0–0.7)
Eosinophils Relative: 2.4 % (ref 0.0–5.0)
HCT: 32.9 % — ABNORMAL LOW (ref 36.0–46.0)
Hemoglobin: 10.6 g/dL — ABNORMAL LOW (ref 12.0–15.0)
LYMPHS PCT: 25.5 % (ref 12.0–46.0)
Lymphs Abs: 1.4 10*3/uL (ref 0.7–4.0)
MCHC: 32.3 g/dL (ref 30.0–36.0)
MCV: 79.1 fl (ref 78.0–100.0)
MONO ABS: 0.4 10*3/uL (ref 0.1–1.0)
Monocytes Relative: 8 % (ref 3.0–12.0)
NEUTROS ABS: 3.5 10*3/uL (ref 1.4–7.7)
Neutrophils Relative %: 63.6 % (ref 43.0–77.0)
PLATELETS: 196 10*3/uL (ref 150.0–400.0)
RBC: 4.16 Mil/uL (ref 3.87–5.11)
RDW: 13.6 % (ref 11.5–15.5)
WBC: 5.4 10*3/uL (ref 4.0–10.5)

## 2016-02-27 LAB — URINALYSIS
Bilirubin Urine: NEGATIVE
Hgb urine dipstick: NEGATIVE
KETONES UR: NEGATIVE
LEUKOCYTES UA: NEGATIVE
Nitrite: NEGATIVE
PH: 6.5 (ref 5.0–8.0)
SPECIFIC GRAVITY, URINE: 1.01 (ref 1.000–1.030)
TOTAL PROTEIN, URINE-UPE24: NEGATIVE
URINE GLUCOSE: NEGATIVE
UROBILINOGEN UA: 0.2 (ref 0.0–1.0)

## 2016-02-27 LAB — BASIC METABOLIC PANEL
BUN: 14 mg/dL (ref 6–23)
CALCIUM: 9.1 mg/dL (ref 8.4–10.5)
CO2: 28 meq/L (ref 19–32)
CREATININE: 0.89 mg/dL (ref 0.40–1.20)
Chloride: 105 mEq/L (ref 96–112)
GFR: 81.3 mL/min (ref >60.00)
GLUCOSE: 102 mg/dL — AB (ref 70–99)
Potassium: 3.9 mEq/L (ref 3.5–5.1)
Sodium: 139 mEq/L (ref 135–145)

## 2016-02-27 LAB — LIPID PANEL
CHOL/HDL RATIO: 3
Cholesterol: 156 mg/dL (ref 0–200)
HDL: 46.3 mg/dL (ref >39.00)
LDL CALC: 92 mg/dL (ref 0–99)
NONHDL: 109.58
TRIGLYCERIDES: 88 mg/dL (ref 0.0–149.0)
VLDL: 17.6 mg/dL (ref 0.0–40.0)

## 2016-02-27 LAB — TSH: TSH: 1.2 u[IU]/mL (ref 0.35–4.50)

## 2016-02-27 LAB — HEPATIC FUNCTION PANEL
ALK PHOS: 59 U/L (ref 39–117)
ALT: 15 U/L (ref 0–35)
AST: 17 U/L (ref 0–37)
Albumin: 3.9 g/dL (ref 3.5–5.2)
BILIRUBIN DIRECT: 0.1 mg/dL (ref 0.0–0.3)
BILIRUBIN TOTAL: 0.4 mg/dL (ref 0.2–1.2)
Total Protein: 7.4 g/dL (ref 6.0–8.3)

## 2016-02-27 LAB — HEPATITIS C ANTIBODY: HCV Ab: NEGATIVE

## 2016-02-27 MED ORDER — DEXLANSOPRAZOLE 60 MG PO CPDR
60.0000 mg | DELAYED_RELEASE_CAPSULE | Freq: Every day | ORAL | Status: DC
Start: 2016-02-27 — End: 2017-02-25

## 2016-02-27 MED ORDER — SPIRONOLACTONE 50 MG PO TABS: 50.0000 mg | ORAL_TABLET | Freq: Two times a day (BID) | ORAL | Status: DC

## 2016-02-27 NOTE — Assessment & Plan Note (Signed)
Wt loss suggested

## 2016-02-27 NOTE — Assessment & Plan Note (Addendum)
  Chronic Coreg, Spironolactone, Doxazosin, Losartan  Labs

## 2016-02-27 NOTE — Progress Notes (Signed)
Pre visit review using our clinic review tool, if applicable. No additional management support is needed unless otherwise documented below in the visit note. 

## 2016-02-27 NOTE — Progress Notes (Signed)
Subjective:  Patient ID: Jennifer Gibson, female    DOB: 1950-11-06  Age: 65 y.o. MRN: [id number]  CC: No chief complaint on file.   HPI Jennifer Gibson presents for GERD, HTN, edema  Outpatient Prescriptions Prior to Visit  Medication Sig Dispense Refill  . carvedilol (COREG) 25 MG tablet Take 1 tablet (25 mg total) by mouth 2 (two) times daily with a meal. 180 tablet 3  . Cetirizine HCl (ZYRTEC ALLERGY) 10 MG CAPS Take by mouth.    . Cholecalciferol (EQL VITAMIN D3) 1000 UNITS tablet Take 1,000 Units by mouth daily.      Marland Kitchen doxazosin (CARDURA) 8 MG tablet Take 1 tablet (8 mg total) by mouth daily. 90 tablet 3  . losartan (COZAAR) 100 MG tablet Take 1 tablet (100 mg total) by mouth daily. 90 tablet 3  . Multiple Vitamin (MULTIVITAMIN) capsule Take 1 capsule by mouth daily.    . Nutritional Supplements (ESTROVEN PO) Take 1 capsule by mouth daily.    . Omega-3 Fatty Acids (OMEGA 3 PO) Take 1 capsule by mouth daily. Mega red    . dexlansoprazole (DEXILANT) 60 MG capsule Take 1 capsule (60 mg total) by mouth daily. --patient needs to schedule office visit before any further refills 30 capsule 0  . spironolactone (ALDACTONE) 50 MG tablet Take 2 tablets (100 mg total) by mouth daily. (Patient taking differently: Take 100 mg by mouth 2 (two) times daily. ) 180 tablet 1   Facility-Administered Medications Prior to Visit  Medication Dose Route Frequency Provider Last Rate Last Dose  . pneumococcal 23 valent vaccine (PNU-IMMUNE) injection 0.5 mL  0.5 mL Intramuscular Tomorrow-1000 Aleksei Plotnikov V, MD        ROS Review of Systems  Constitutional: Negative for chills, activity change, appetite change, fatigue and unexpected weight change.  HENT: Negative for congestion, mouth sores and sinus pressure.   Eyes: Negative for visual disturbance.  Respiratory: Negative for cough and chest tightness.   Gastrointestinal: Negative for nausea and abdominal pain.  Genitourinary: Negative for frequency,  difficulty urinating and vaginal pain.  Musculoskeletal: Negative for back pain and gait problem.  Skin: Negative for pallor and rash.  Neurological: Negative for dizziness, tremors, weakness, numbness and headaches.  Psychiatric/Behavioral: Negative for confusion and sleep disturbance.    Objective:  BP 138/80 mmHg  Pulse 85  Wt 282 lb (127.914 kg)  SpO2 96%  BP Readings from Last 3 Encounters:  02/27/16 138/80  02/15/16 138/41  12/21/15 142/82    Wt Readings from Last 3 Encounters:  02/27/16 282 lb (127.914 kg)  02/15/16 280 lb (127.007 kg)  02/01/16 280 lb (127.007 kg)    Physical Exam  Constitutional: She appears well-developed. No distress.  HENT:  Head: Normocephalic.  Right Ear: External ear normal.  Left Ear: External ear normal.  Nose: Nose normal.  Mouth/Throat: Oropharynx is clear and moist.  Eyes: Conjunctivae are normal. Pupils are equal, round, and reactive to light. Right eye exhibits no discharge. Left eye exhibits no discharge.  Neck: Normal range of motion. Neck supple. No JVD present. No tracheal deviation present. No thyromegaly present.  Cardiovascular: Normal rate, regular rhythm and normal heart sounds.   Pulmonary/Chest: No stridor. No respiratory distress. She has no wheezes.  Abdominal: Soft. Bowel sounds are normal. She exhibits no distension and no mass. There is no tenderness. There is no rebound and no guarding.  Musculoskeletal: She exhibits edema and tenderness.  Lymphadenopathy:    She has no cervical adenopathy.  Neurological: She displays normal reflexes. No cranial nerve deficit. She exhibits normal muscle tone. Coordination normal.  Skin: No rash noted. No erythema.  Psychiatric: She has a normal mood and affect. Her behavior is normal. Judgment and thought content normal.  Obese  Lab Results  Component Value Date   WBC 5.4 02/27/2016   HGB 10.6* 02/27/2016   HCT 32.9* 02/27/2016   PLT 196.0 02/27/2016   GLUCOSE 102* 02/27/2016     CHOL 156 02/27/2016   TRIG 88.0 02/27/2016   HDL 46.30 02/27/2016   LDLCALC 92 02/27/2016   ALT 15 02/27/2016   AST 17 02/27/2016   NA 139 02/27/2016   K 3.9 02/27/2016   CL 105 02/27/2016   CREATININE 0.89 02/27/2016   BUN 14 02/27/2016   CO2 28 02/27/2016   TSH 1.20 02/27/2016   HGBA1C 6.0 03/11/2014    Dg Knee Complete 4 Views Right  05/14/2012  *RADIOLOGY REPORT* Clinical Data: Pain and swelling of the right knee.  No known injury. RIGHT KNEE - COMPLETE 4+ VIEW Comparison: None. Findings: There is density in the suprapatellar region on lateral image.  This suggests possible element of joint effusion.  There is slight narrowing of the patellofemoral and medial joint spaces. There is minimal posterior patellar marginal osteophyte formation. There is a small enthesophyte spur arising from the anterior superior aspect of the patella.  No fracture, bony destruction, chondrocalcinosis, or opaque loose body is evident. IMPRESSION: Question small amount of joint effusion.  Slight narrowing of patellofemoral and medial joint spaces.  Minimal posterior patellar marginal osteophyte formation.  Enthesophyte spurring of anterior superior aspect of patella. Original Report Authenticated By: Delane Ginger, M.D.   Assessment & Plan:   Diagnoses and all orders for this visit:  Essential hypertension -     Hepatitis C antibody; Future -     Basic metabolic panel; Future -     CBC with Differential/Platelet; Future -     Hepatic function panel; Future -     Lipid panel; Future -     TSH; Future -     Urinalysis; Future  Gastroesophageal reflux disease with esophagitis -     Hepatitis C antibody; Future -     Basic metabolic panel; Future -     CBC with Differential/Platelet; Future -     Hepatic function panel; Future -     Lipid panel; Future -     TSH; Future -     Urinalysis; Future  Venous (peripheral) insufficiency -     Hepatitis C antibody; Future -     Basic metabolic panel;  Future -     CBC with Differential/Platelet; Future -     Hepatic function panel; Future -     Lipid panel; Future -     TSH; Future -     Urinalysis; Future  Dyslipidemia -     Lipid panel; Future -     TSH; Future  Other orders -     Cancel: dexlansoprazole (DEXILANT) 60 MG capsule; Take 1 capsule (60 mg total) by mouth daily. -     Discontinue: spironolactone (ALDACTONE) 50 MG tablet; Take 1 tablet (50 mg total) by mouth 2 (two) times daily. -     dexlansoprazole (DEXILANT) 60 MG capsule; Take 1 capsule (60 mg total) by mouth daily. -     spironolactone (ALDACTONE) 50 MG tablet; Take 1 tablet (50 mg total) by mouth 2 (two) times daily.  I have changed Ms. Amirian dexlansoprazole.  I am also having her maintain her Cholecalciferol, carvedilol, doxazosin, losartan, Cetirizine HCl, multivitamin, Nutritional Supplements (ESTROVEN PO), Omega-3 Fatty Acids (OMEGA 3 PO), and spironolactone. We will continue to administer pneumococcal 23 valent vaccine.  Meds ordered this encounter  Medications  . DISCONTD: spironolactone (ALDACTONE) 50 MG tablet    Sig: Take 1 tablet (50 mg total) by mouth 2 (two) times daily.    Dispense:  30 tablet    Refill:  0  . dexlansoprazole (DEXILANT) 60 MG capsule    Sig: Take 1 capsule (60 mg total) by mouth daily.    Dispense:  90 capsule    Refill:  3  . spironolactone (ALDACTONE) 50 MG tablet    Sig: Take 1 tablet (50 mg total) by mouth 2 (two) times daily.    Dispense:  180 tablet    Refill:  3     Follow-up: Return in about 6 months (around 08/28/2016) for Wellness Exam.  Walker Kehr, MD

## 2016-02-27 NOTE — Assessment & Plan Note (Signed)
On Dexilant 

## 2016-05-13 ENCOUNTER — Encounter: Payer: Self-pay | Admitting: Internal Medicine

## 2016-05-16 ENCOUNTER — Ambulatory Visit (INDEPENDENT_AMBULATORY_CARE_PROVIDER_SITE_OTHER): Payer: Medicare Other | Admitting: Internal Medicine

## 2016-05-16 ENCOUNTER — Encounter: Payer: Self-pay | Admitting: Internal Medicine

## 2016-05-16 VITALS — BP 116/84 | HR 84 | Temp 98.3°F | Resp 20 | Ht 65.0 in | Wt 278.0 lb

## 2016-05-16 DIAGNOSIS — M7052 Other bursitis of knee, left knee: Secondary | ICD-10-CM | POA: Diagnosis not present

## 2016-05-16 MED ORDER — NAPROXEN 500 MG PO TABS: 500.0000 mg | ORAL_TABLET | Freq: Two times a day (BID) | ORAL | Status: DC

## 2016-05-16 NOTE — Progress Notes (Signed)
Pre visit review using our clinic review tool, if applicable. No additional management support is needed unless otherwise documented below in the visit note. 

## 2016-05-16 NOTE — Patient Instructions (Signed)
We would recommend taking naproxen twice a day with meals for 1 week. I have sent in a prescription for this if you need it. Then you can use it as needed for pain.   Icing the knee in the evening for about 10-15 minutes will also be helpful.  Prepatellar Bursitis With Rehab  Bursitis is a condition that is characterized by inflammation of a bursa. Saunders Revel exists in many areas of the body. They are fluid-filled sacs that lie between a soft tissue (skin, tendon, or ligament) and a bone, and they reduce friction between the structures as well as the stress placed on the soft tissue. Prepatellar bursitis is inflammation of the bursa that lies between the skin and the kneecap (patella). This condition often causes pain over the patella. SYMPTOMS   Pain, tenderness, and/or inflammation over the patella.  Pain that worsens with movement of the knee joint.  Decreased range of motion for the knee joint.  A crackling sound (crepitation) when the bursa is moved or touched.  Occasionally, painless swelling of the bursa.  Fever (when infected). CAUSES  Bursitis is caused by damage to the bursa, which results in an inflammatory response. Common mechanisms of injury include:  Direct trauma to the front of the knee.  Repetitive and/or stressful use of the knee. RISK INCREASES WITH:  Activities in which kneeling and/or falling on one's knees is likely (volleyball or football).  Repetitive and stressful training, especially if it involves running on hills.  Improper training techniques, such as a sudden increase in the intensity, frequency, or duration of training.  Failure to warm up properly before activity.  Poor technique.  Artificial turf. PREVENTION   Avoid kneeling or falling on your knees.  Warm up and stretch properly before activity.  Allow for adequate recovery between workouts.  Maintain physical fitness:  Strength, flexibility, and endurance.  Cardiovascular  fitness.  Learn and use proper technique. When possible, have a coach correct improper technique.  Wear properly fitted and padded protective equipment (knee pads). PROGNOSIS  If treated properly, then the symptoms of prepatellar bursitis usually resolve within 2 weeks. RELATED COMPLICATIONS   Recurrent symptoms that result in a chronic problem.  Prolonged healing time, if improperly treated or reinjured.  Limited range of motion.  Infection of bursa.  Chronic inflammation or scarring of bursa. TREATMENT  Treatment initially involves the use of ice and medication to help reduce pain and inflammation. The use of strengthening and stretching exercises may help reduce pain with activity, especially those of the quadriceps and hamstring muscles. These exercises may be performed at home or with referral to a therapist. Your caregiver may recommend knee pads when you return to playing sports, in order to reduce the stress on the prepatellar bursa. If symptoms persist despite treatment, then your caregiver may drain fluid out with a needle (aspirate) the bursa. If symptoms persist for greater than 6 months despite nonsurgical (conservative) treatment, then surgery may be recommended to remove the bursa.  MEDICATION  If pain medication is necessary, then nonsteroidal anti-inflammatory medications, such as aspirin and ibuprofen, or other minor pain relievers, such as acetaminophen, are often recommended.  Do not take pain medication for 7 days before surgery.  Prescription pain relievers may be given if deemed necessary by your caregiver. Use only as directed and only as much as you need.  Corticosteroid injections may be given by your caregiver. These injections should be reserved for the most serious cases, because they may only be given  a certain number of times. HEAT AND COLD  Cold treatment (icing) relieves pain and reduces inflammation. Cold treatment should be applied for 10 to 15  minutes every 2 to 3 hours for inflammation and pain and immediately after any activity that aggravates your symptoms. Use ice packs or massage the area with a piece of ice (ice massage).  Heat treatment may be used prior to performing the stretching and strengthening activities prescribed by your caregiver, physical therapist, or athletic trainer. Use a heat pack or soak the injury in warm water. SEEK MEDICAL CARE IF:  Treatment seems to offer no benefit, or the condition worsens.  Any medications produce adverse side effects. EXERCISES RANGE OF MOTION (ROM) AND STRETCHING EXERCISES - Prepatellar Bursitis These exercises may help you when beginning to rehabilitate your injury. Your symptoms may resolve with or without further involvement from your physician, physical therapist or athletic trainer. While completing these exercises, remember:   Restoring tissue flexibility helps normal motion to return to the joints. This allows healthier, less painful movement and activity.  An effective stretch should be held for at least 30 seconds.  A stretch should never be painful. You should only feel a gentle lengthening or release in the stretched tissue. STRETCH - Hamstrings, Standing  Stand or sit and extend your right / left leg, placing your foot on a chair or foot stool  Keeping a slight arch in your low back and your hips straight forward.  Lead with your chest and lean forward at the waist until you feel a gentle stretch in the back of your right / left knee or thigh. (When done correctly, this exercise requires leaning only a small distance.)  Hold this position for __________ seconds. Repeat __________ times. Complete this stretch __________ times per day. STRETCH - Quadriceps, Prone   Lie on your stomach on a firm surface, such as a bed or padded floor.  Bend your right / left knee and grasp your ankle. If you are unable to reach, your ankle or pant leg, use a belt around your foot to  lengthen your reach.  Gently pull your heel toward your buttocks. Your knee should not slide out to the side. You should feel a stretch in the front of your thigh and/or knee.  Hold this position for __________ seconds. Repeat __________ times. Complete this stretch __________ times per day.  STRETCH - Hamstrings/Adductors, V-Sit   Sit on the floor with your legs extended in a large "V," keeping your knees straight.  With your head and chest upright, bend at your waist reaching for your right foot to stretch your left adductors.  You should feel a stretch in your left inner thigh. Hold for __________ seconds.  Return to the upright position to relax your leg muscles.  Continuing to keep your chest upright, bend straight forward at your waist to stretch your hamstrings.  You should feel a stretch behind both of your thighs and/or knees. Hold for __________ seconds.  Return to the upright position to relax your leg muscles.  Repeat steps 2 through 4. Repeat __________ times. Complete this exercise __________ times per day.  STRENGTHENING EXERCISES - Prepatellar Bursitis  These exercises may help you when beginning to rehabilitate your injury. They may resolve your symptoms with or without further involvement from your physician, physical therapist or athletic trainer. While completing these exercises, remember:  Muscles can gain both the endurance and the strength needed for everyday activities through controlled exercises.  Complete these  exercises as instructed by your physician, physical therapist or athletic trainer. Progress the resistance and repetitions only as guided. STRENGTH - Quadriceps, Isometrics  Lie on your back with your right / left leg extended and your opposite knee bent.  Gradually tense the muscles in the front of your right / left thigh. You should see either your kneecap slide up toward your hip or increased dimpling just above the knee. This motion will push  the back of the knee down toward the floor/mat/bed on which you are lying.  Hold the muscle as tight as you can without increasing your pain for __________ seconds.  Relax the muscles slowly and completely in between each repetition. Repeat __________ times. Complete this exercise __________ times per day.  STRENGTH - Quadriceps, Short Arcs   Lie on your back. Place a __________ inch towel roll under your knee so that the knee slightly bends.  Raise only your lower leg by tightening the muscles in the front of your thigh. Do not allow your thigh to rise.  Hold this position for __________ seconds. Repeat __________ times. Complete this exercise __________ times per day.  OPTIONAL ANKLE WEIGHTS: Begin with ____________________, but DO NOT exceed ____________________. Increase in1 lb/0.5 kg increments.  STRENGTH - Quadriceps, Straight Leg Raises  Quality counts! Watch for signs that the quadriceps muscle is working to insure you are strengthening the correct muscles and not "cheating" by substituting with healthier muscles.  Lay on your back with your right / left leg extended and your opposite knee bent.  Tense the muscles in the front of your right / left thigh. You should see either your kneecap slide up or increased dimpling just above the knee. Your thigh may even quiver.  Tighten these muscles even more and raise your leg 4 to 6 inches off the floor. Hold for __________ seconds.  Keeping these muscles tense, lower your leg.  Relax the muscles slowly and completely in between each repetition. Repeat __________ times. Complete this exercise __________ times per day.  STRENGTH - Quadriceps, Step-Ups   Use a thick book, step or step stool that is __________ inches tall.  Holding a wall or counter for balance only, not support.  Slowly step-up with your right / left foot, keeping your knee in line with your hip and foot. Do not allow your knee to bend so far that you cannot see your  toes.  Slowly unlock your knee and lower yourself to the starting position. Your muscles, not gravity, should lower you. Repeat __________ times. Complete this exercise __________ times per day.   This information is not intended to replace advice given to you by your health care provider. Make sure you discuss any questions you have with your health care provider.   Document Released: 10/21/2005 Document Revised: 07/12/2015 Document Reviewed: 02/02/2009 Elsevier Interactive Patient Education [employer].

## 2016-05-16 NOTE — Assessment & Plan Note (Signed)
Rx for naproxen and icing regimen and stretching exercises given. Call back if no improvement in 2 weeks. Talked about the fact that her weight puts extra strain on her knees.

## 2016-05-16 NOTE — Progress Notes (Signed)
   Subjective:    Patient ID: Jennifer Gibson, female    DOB: 26-Jul-1951, 65 y.o.   MRN: [id number]  HPI The patient is a 65 YO female coming in for left knee pain. Started about 1 week ago. No known cause or trigger. She does move things with her legs and uses her knees to move objects. No swelling or redness. She has been taking ibuprofen high dose which is helping some. Worse with more activity.   Review of Systems  Constitutional: Positive for activity change. Negative for fever, chills, appetite change and fatigue.  Respiratory: Negative.   Cardiovascular: Negative.   Gastrointestinal: Negative.   Musculoskeletal: Positive for arthralgias. Negative for myalgias, back pain, joint swelling and gait problem.  Skin: Negative.       Objective:   Physical Exam  Constitutional: She is oriented to person, place, and time. She appears well-developed and well-nourished.  Overweight  HENT:  Head: Normocephalic and atraumatic.  Eyes: EOM are normal.  Cardiovascular: Normal rate and regular rhythm.   Pulmonary/Chest: Effort normal and breath sounds normal. No respiratory distress. She has no wheezes.  Abdominal: Soft.  Musculoskeletal:  ACL and PCL intact left knee, no tenderness over MCL or LCL. Tender over the pre-patellar bursa. No swelling in the knee or skin color change.   Neurological: She is alert and oriented to person, place, and time. Coordination normal.   Filed Vitals:   05/16/16 0930  BP: 116/84  Pulse: 84  Temp: 98.3 F (36.8 C)  TempSrc: Oral  Resp: 20  Height: 5\' 5"  (1.651 m)  Weight: 278 lb (126.1 kg)  SpO2: 97%      Assessment & Plan:

## 2016-05-20 DIAGNOSIS — M1712 Unilateral primary osteoarthritis, left knee: Secondary | ICD-10-CM | POA: Diagnosis not present

## 2016-05-20 DIAGNOSIS — M25562 Pain in left knee: Secondary | ICD-10-CM | POA: Diagnosis not present

## 2016-06-13 ENCOUNTER — Other Ambulatory Visit: Payer: Self-pay | Admitting: Internal Medicine

## 2016-07-20 ENCOUNTER — Encounter (HOSPITAL_COMMUNITY): Payer: Self-pay | Admitting: *Deleted

## 2016-07-20 ENCOUNTER — Ambulatory Visit (HOSPITAL_COMMUNITY)
Admission: EM | Admit: 2016-07-20 | Discharge: 2016-07-20 | Disposition: A | Discharge status: Home / Self Care | Payer: Medicare Other | Attending: Family Medicine | Admitting: Family Medicine

## 2016-07-20 DIAGNOSIS — J069 Acute upper respiratory infection, unspecified: Secondary | ICD-10-CM | POA: Diagnosis not present

## 2016-07-20 MED ORDER — ALBUTEROL SULFATE HFA 108 (90 BASE) MCG/ACT IN AERS: 1.0000 | INHALATION_SPRAY | Freq: Four times a day (QID) | RESPIRATORY_TRACT | 0 refills | Status: DC | PRN

## 2016-07-20 MED ORDER — AZITHROMYCIN 250 MG PO TABS: ORAL_TABLET | ORAL | 0 refills | Status: DC

## 2016-07-20 MED ORDER — IPRATROPIUM-ALBUTEROL 0.5-2.5 (3) MG/3ML IN SOLN
3.0000 mL | Freq: Once | RESPIRATORY_TRACT | Status: AC
  Administered 2016-07-20: 3 mL via RESPIRATORY_TRACT

## 2016-07-20 MED ORDER — IPRATROPIUM-ALBUTEROL 0.5-2.5 (3) MG/3ML IN SOLN
RESPIRATORY_TRACT | Status: AC
  Filled 2016-07-20: qty 3

## 2016-07-20 MED ORDER — HYDROCODONE-ACETAMINOPHEN 7.5-325 MG/15ML PO SOLN: 10.0000 mL | Freq: Every evening | ORAL | 0 refills | Status: DC | PRN

## 2016-07-20 MED ORDER — SODIUM CHLORIDE 0.9 % IN NEBU
INHALATION_SOLUTION | RESPIRATORY_TRACT | Status: AC
  Filled 2016-07-20: qty 3

## 2016-07-20 NOTE — Discharge Instructions (Signed)
It may take a few weeks for your cough to completely resolve.  You may continue to cough after treatment, often this is expected.  If your cough lingers for a month after treatment you should follow up with your doctor

## 2016-07-20 NOTE — ED Provider Notes (Signed)
CSN: [id number]     Arrival date & time 07/20/16  1231 History   First MD Initiated Contact with Patient 07/20/16 1434     Chief Complaint  Patient presents with  . Cough   (Consider location/radiation/quality/duration/timing/severity/associated sxs/prior Treatment) HPI 65 y/o female with cough for almost 2 weeks. No wheezing at night. Day time  Cough is not too bad  But night cough is the worse. Keeps her up, unable to get  Now wheezing over the  Last couple of days. No other symptoms. No pain.  Past Medical History:  Diagnosis Date  . Allergy   . Anemia   . Anxiety   . Detached retina, right 2009  . Hypertension   . Morbid obesity ([place])   . Sleep apnea    doesnt wear  . Venous insufficiency of leg    right foot / ankle -sev.yeras ago   Past Surgical History:  Procedure Laterality Date  . ABDOMINAL HYSTERECTOMY     partial  . COLONOSCOPY    . POLYPECTOMY     Family History  Problem Relation Age of Onset  . Heart disease Father   . Hypertension Other   . Colon polyps Brother     benign  . Colon cancer Neg Hx   . Esophageal cancer Neg Hx   . Pancreatic cancer Neg Hx   . Rectal cancer Neg Hx   . Stomach cancer Neg Hx    Social History  Substance Use Topics  . Smoking status: Never Smoker  . Smokeless tobacco: Never Used     Comment: alot of second hand smoke  . Alcohol use No   OB History    No data available     Review of Systems  Denies: HEADACHE, NAUSEA, ABDOMINAL PAIN, CHEST PAIN, CONGESTION, DYSURIA, SHORTNESS OF BREATH   Allergies  Penicillins  Home Medications   Prior to Admission medications   Medication Sig Start Date End Date Taking? Authorizing Provider  carvedilol (COREG) 25 MG tablet TAKE 1 TABLET TWICE A DAY WITH MEALS 06/13/16  Yes Cassandria Anger, MD  Cetirizine HCl (ZYRTEC ALLERGY) 10 MG CAPS Take by mouth.   Yes Historical Provider, MD  Cholecalciferol (EQL VITAMIN D3) 1000 UNITS tablet Take 1,000 Units by mouth daily.     Yes  Historical Provider, MD  dexlansoprazole (DEXILANT) 60 MG capsule Take 1 capsule (60 mg total) by mouth daily. 02/27/16  Yes Evie Lacks Plotnikov, MD  doxazosin (CARDURA) 8 MG tablet TAKE 1 TABLET DAILY 06/13/16  Yes Evie Lacks Plotnikov, MD  losartan (COZAAR) 100 MG tablet TAKE 1 TABLET DAILY 06/13/16  Yes Cassandria Anger, MD  Multiple Vitamin (MULTIVITAMIN) capsule Take 1 capsule by mouth daily.   Yes Historical Provider, MD  Nutritional Supplements (ESTROVEN PO) Take 1 capsule by mouth daily.   Yes Historical Provider, MD  Omega-3 Fatty Acids (OMEGA 3 PO) Take 1 capsule by mouth daily. Mega red   Yes Historical Provider, MD  spironolactone (ALDACTONE) 50 MG tablet Take 1 tablet (50 mg total) by mouth 2 (two) times daily. 02/27/16  Yes Cassandria Anger, MD  albuterol (PROVENTIL HFA;VENTOLIN HFA) 108 (90 Base) MCG/ACT inhaler Inhale 1-2 puffs into the lungs every 6 (six) hours as needed for wheezing or shortness of breath. 07/20/16   Konrad Felix, PA  azithromycin (ZITHROMAX) 250 MG tablet Take first 2 tablets together, then 1 every day until finished. 07/20/16   Konrad Felix, PA  HYDROcodone-acetaminophen (HYCET) 7.5-325 mg/15 ml solution Take  10 mLs by mouth at bedtime and may repeat dose one time if needed. 07/20/16   Konrad Felix, PA  naproxen (NAPROSYN) 500 MG tablet Take 1 tablet (500 mg total) by mouth 2 (two) times daily with a meal. 05/16/16   Hoyt Koch, MD   Meds Ordered and Administered this Visit   Medications  ipratropium-albuterol (DUONEB) 0.5-2.5 (3) MG/3ML nebulizer solution 3 mL (3 mLs Nebulization Given 07/20/16 1446)    BP 120/64 (BP Location: Left Arm)   Pulse 75   Temp 98.3 F (36.8 C) (Oral)   Resp 12   SpO2 100%  No data found.   Physical Exam NURSES NOTES AND VITAL SIGNS REVIEWED. CONSTITUTIONAL: Well developed, well nourished, no acute distress HEENT: normocephalic, atraumatic EYES: Conjunctiva normal NECK:normal ROM, supple, no  adenopathy PULMONARY:No respiratory distress, normal effort, diffuse wheezing through out lungs fielsd ABDOMINAL: Soft, ND, NT BS+, No CVAT MUSCULOSKELETAL: Normal ROM of all extremities,  SKIN: warm and dry without rash PSYCHIATRIC: Mood and affect, behavior are normal  Urgent Care Course   Clinical Course  Nebulizer tx given, with complete resolution of symptoms.    Procedures (including critical care time)  Labs Review Labs Reviewed - No data to display  Imaging Review No results found.   Visual Acuity Review  Right Eye Distance:   Left Eye Distance:   Bilateral Distance:    Right Eye Near:   Left Eye Near:    Bilateral Near:         MDM   1. Acute upper respiratory infection     Patient is reassured that there are no issues that require transfer to higher level of care at this time or additional tests. Patient is advised to continue home symptomatic treatment. Patient is advised that if there are new or worsening symptoms to attend the emergency department, contact primary care provider, or return to UC. Instructions of care provided discharged home in stable condition.    THIS NOTE WAS GENERATED USING A VOICE RECOGNITION SOFTWARE PROGRAM. ALL REASONABLE EFFORTS  WERE MADE TO PROOFREAD THIS DOCUMENT FOR ACCURACY.  I have verbally reviewed the discharge instructions with the patient. A printed AVS was given to the patient.  All questions were answered prior to discharge.      Konrad Felix, [place] 07/20/16 1555

## 2016-07-20 NOTE — ED Notes (Signed)
States breathing feels "better".

## 2016-07-20 NOTE — ED Notes (Signed)
Breathing treatment in progress

## 2016-11-13 DIAGNOSIS — Z6841 Body Mass Index (BMI) 40.0 and over, adult: Secondary | ICD-10-CM | POA: Diagnosis not present

## 2016-11-13 DIAGNOSIS — Z01419 Encounter for gynecological examination (general) (routine) without abnormal findings: Secondary | ICD-10-CM | POA: Diagnosis not present

## 2016-11-13 DIAGNOSIS — Z1231 Encounter for screening mammogram for malignant neoplasm of breast: Secondary | ICD-10-CM | POA: Diagnosis not present

## 2016-12-05 ENCOUNTER — Ambulatory Visit (INDEPENDENT_AMBULATORY_CARE_PROVIDER_SITE_OTHER): Payer: Medicare Other | Admitting: Psychology

## 2016-12-05 DIAGNOSIS — F321 Major depressive disorder, single episode, moderate: Secondary | ICD-10-CM

## 2016-12-11 ENCOUNTER — Encounter: Payer: Self-pay | Admitting: Internal Medicine

## 2016-12-11 ENCOUNTER — Ambulatory Visit (INDEPENDENT_AMBULATORY_CARE_PROVIDER_SITE_OTHER): Payer: Medicare Other | Admitting: Internal Medicine

## 2016-12-11 VITALS — BP 138/78 | HR 95 | Temp 97.9°F | Resp 20 | Wt 279.0 lb

## 2016-12-11 DIAGNOSIS — F411 Generalized anxiety disorder: Secondary | ICD-10-CM

## 2016-12-11 DIAGNOSIS — I10 Essential (primary) hypertension: Secondary | ICD-10-CM

## 2016-12-11 DIAGNOSIS — Z0000 Encounter for general adult medical examination without abnormal findings: Secondary | ICD-10-CM

## 2016-12-11 DIAGNOSIS — IMO0001 Reserved for inherently not codable concepts without codable children: Secondary | ICD-10-CM

## 2016-12-11 DIAGNOSIS — E785 Hyperlipidemia, unspecified: Secondary | ICD-10-CM

## 2016-12-11 DIAGNOSIS — R0683 Snoring: Secondary | ICD-10-CM

## 2016-12-11 MED ORDER — ESCITALOPRAM OXALATE 5 MG PO TABS: 5.0000 mg | ORAL_TABLET | Freq: Every day | ORAL | 5 refills | Status: DC

## 2016-12-11 NOTE — Patient Instructions (Signed)

## 2016-12-11 NOTE — Progress Notes (Signed)
Pre visit review using our clinic review tool, if applicable. No additional management support is needed unless otherwise documented below in the visit note. 

## 2016-12-11 NOTE — Assessment & Plan Note (Signed)
Weight: 279 lb (126.6 kg)

## 2016-12-11 NOTE — Assessment & Plan Note (Signed)
Coreg, Spironolactone, Doxazosyn, Losartan,

## 2016-12-11 NOTE — Progress Notes (Signed)
Subjective:  Patient ID: Jennifer Gibson, female    DOB: [birth year]  Age: 66 y.o. MRN: [id number]  CC: No chief complaint on file.   HPI MEKHIYA PRESTAGE presents for a well exam F/u HTN, obesity, GERD f/u. Husband had a brain hemorrhage last fall - stress at home  Outpatient Medications Prior to Visit  Medication Sig Dispense Refill  . albuterol (PROVENTIL HFA;VENTOLIN HFA) 108 (90 Base) MCG/ACT inhaler Inhale 1-2 puffs into the lungs every 6 (six) hours as needed for wheezing or shortness of breath. 1 Inhaler 0  . azithromycin (ZITHROMAX) 250 MG tablet Take first 2 tablets together, then 1 every day until finished. 6 tablet 0  . carvedilol (COREG) 25 MG tablet TAKE 1 TABLET TWICE A DAY WITH MEALS 180 tablet 2  . Cetirizine HCl (ZYRTEC ALLERGY) 10 MG CAPS Take by mouth.    . Cholecalciferol (EQL VITAMIN D3) 1000 UNITS tablet Take 1,000 Units by mouth daily.      Marland Kitchen dexlansoprazole (DEXILANT) 60 MG capsule Take 1 capsule (60 mg total) by mouth daily. 90 capsule 3  . doxazosin (CARDURA) 8 MG tablet TAKE 1 TABLET DAILY 90 tablet 2  . HYDROcodone-acetaminophen (HYCET) 7.5-325 mg/15 ml solution Take 10 mLs by mouth at bedtime and may repeat dose one time if needed. 120 mL 0  . losartan (COZAAR) 100 MG tablet TAKE 1 TABLET DAILY 90 tablet 2  . Multiple Vitamin (MULTIVITAMIN) capsule Take 1 capsule by mouth daily.    . naproxen (NAPROSYN) 500 MG tablet Take 1 tablet (500 mg total) by mouth 2 (two) times daily with a meal. 30 tablet 0  . Nutritional Supplements (ESTROVEN PO) Take 1 capsule by mouth daily.    . Omega-3 Fatty Acids (OMEGA 3 PO) Take 1 capsule by mouth daily. Mega red    . spironolactone (ALDACTONE) 50 MG tablet Take 1 tablet (50 mg total) by mouth 2 (two) times daily. 180 tablet 3   Facility-Administered Medications Prior to Visit  Medication Dose Route Frequency Provider Last Rate Last Dose  . pneumococcal 23 valent vaccine (PNU-IMMUNE) injection 0.5 mL  0.5 mL Intramuscular  Tomorrow-1000 Weslie Pretlow V, MD        ROS Review of Systems  Constitutional: Positive for fatigue. Negative for activity change, appetite change, chills and unexpected weight change.  HENT: Negative for congestion, mouth sores and sinus pressure.   Eyes: Negative for visual disturbance.  Respiratory: Negative for cough and chest tightness.   Gastrointestinal: Negative for abdominal pain and nausea.  Genitourinary: Negative for difficulty urinating, frequency and vaginal pain.  Musculoskeletal: Negative for back pain and gait problem.  Skin: Negative for pallor and rash.  Neurological: Negative for dizziness, tremors, weakness, numbness and headaches.  Psychiatric/Behavioral: Negative for confusion and sleep disturbance.    Objective:  BP 138/78   Pulse 95   Temp 97.9 F (36.6 C) (Oral)   Resp 20   Wt 279 lb (126.6 kg)   SpO2 93%   BMI 46.43 kg/m   BP Readings from Last 3 Encounters:  12/11/16 138/78  07/20/16 120/64  05/16/16 116/84    Wt Readings from Last 3 Encounters:  12/11/16 279 lb (126.6 kg)  05/16/16 278 lb (126.1 kg)  02/27/16 282 lb (127.9 kg)    Physical Exam  Constitutional: She appears well-developed. No distress.  HENT:  Head: Normocephalic.  Right Ear: External ear normal.  Left Ear: External ear normal.  Nose: Nose normal.  Mouth/Throat: Oropharynx is clear and moist.  Eyes: Conjunctivae are normal. Pupils are equal, round, and reactive to light. Right eye exhibits no discharge. Left eye exhibits no discharge.  Neck: Normal range of motion. Neck supple. No JVD present. No tracheal deviation present. No thyromegaly present.  Cardiovascular: Normal rate, regular rhythm and normal heart sounds.   Pulmonary/Chest: No stridor. No respiratory distress. She has no wheezes.  Abdominal: Soft. Bowel sounds are normal. She exhibits no distension and no mass. There is no tenderness. There is no rebound and no guarding.  Musculoskeletal: She exhibits no  edema or tenderness.  Lymphadenopathy:    She has no cervical adenopathy.  Neurological: She displays normal reflexes. No cranial nerve deficit. She exhibits normal muscle tone. Coordination normal.  Skin: No rash noted. No erythema.  Psychiatric: She has a normal mood and affect. Her behavior is normal. Judgment and thought content normal.  obese  Lab Results  Component Value Date   WBC 5.4 02/27/2016   HGB 10.6 (L) 02/27/2016   HCT 32.9 (L) 02/27/2016   PLT 196.0 02/27/2016   GLUCOSE 102 (H) 02/27/2016   CHOL 156 02/27/2016   TRIG 88.0 02/27/2016   HDL 46.30 02/27/2016   LDLCALC 92 02/27/2016   ALT 15 02/27/2016   AST 17 02/27/2016   NA 139 02/27/2016   K 3.9 02/27/2016   CL 105 02/27/2016   CREATININE 0.89 02/27/2016   BUN 14 02/27/2016   CO2 28 02/27/2016   TSH 1.20 02/27/2016   HGBA1C 6.0 03/11/2014    No results found.  Assessment & Plan:   There are no diagnoses linked to this encounter. I am having Ms. Deberry maintain her Cholecalciferol, Cetirizine HCl, multivitamin, Nutritional Supplements (ESTROVEN PO), Omega-3 Fatty Acids (OMEGA 3 PO), dexlansoprazole, spironolactone, naproxen, losartan, carvedilol, doxazosin, albuterol, azithromycin, and HYDROcodone-acetaminophen. We will continue to administer pneumococcal 23 valent vaccine.  No orders of the defined types were placed in this encounter.     Follow-up: No Follow-up on file.  Walker Kehr, MD

## 2016-12-11 NOTE — Assessment & Plan Note (Addendum)
Chronic  Coping w/stress - Husband had a brain hemorrhage last fall 2017 Start Lexapro - low dose

## 2016-12-11 NOTE — Assessment & Plan Note (Signed)
labs

## 2016-12-11 NOTE — Assessment & Plan Note (Signed)
On CPAP. ?

## 2016-12-17 ENCOUNTER — Ambulatory Visit: Payer: Medicare Other | Admitting: Psychology

## 2016-12-31 ENCOUNTER — Ambulatory Visit (INDEPENDENT_AMBULATORY_CARE_PROVIDER_SITE_OTHER): Payer: Medicare Other | Admitting: Psychology

## 2016-12-31 DIAGNOSIS — F321 Major depressive disorder, single episode, moderate: Secondary | ICD-10-CM | POA: Diagnosis not present

## 2017-01-20 ENCOUNTER — Ambulatory Visit: Payer: Medicare Other | Admitting: Psychology

## 2017-02-10 ENCOUNTER — Ambulatory Visit: Payer: Medicare Other | Admitting: Psychology

## 2017-02-25 ENCOUNTER — Other Ambulatory Visit: Payer: Self-pay | Admitting: Internal Medicine

## 2017-02-26 ENCOUNTER — Other Ambulatory Visit: Payer: Self-pay | Admitting: *Deleted

## 2017-02-26 MED ORDER — ESCITALOPRAM OXALATE 5 MG PO TABS: 5.0000 mg | ORAL_TABLET | Freq: Every day | ORAL | 2 refills | Status: DC

## 2017-03-10 ENCOUNTER — Other Ambulatory Visit: Payer: Self-pay | Admitting: Internal Medicine

## 2017-05-23 ENCOUNTER — Other Ambulatory Visit: Payer: Self-pay

## 2017-05-27 ENCOUNTER — Encounter: Payer: Self-pay | Admitting: Internal Medicine

## 2017-06-03 NOTE — Telephone Encounter (Signed)
Patients Medication is on backorder and the need an alternative to fill until they can get her medication in stock. Please advise

## 2017-06-07 ENCOUNTER — Other Ambulatory Visit: Payer: Self-pay | Admitting: Internal Medicine

## 2017-06-07 MED ORDER — SPIRONOLACTONE-HCTZ 50-50 MG PO TABS: 1.0000 | ORAL_TABLET | Freq: Every day | ORAL | 3 refills | Status: DC

## 2017-06-10 MED ORDER — SPIRONOLACTONE 50 MG PO TABS: 50.0000 mg | ORAL_TABLET | Freq: Every day | ORAL | 3 refills | Status: DC

## 2017-11-12 ENCOUNTER — Other Ambulatory Visit: Payer: Self-pay | Admitting: Internal Medicine

## 2017-12-05 ENCOUNTER — Other Ambulatory Visit: Payer: Self-pay | Admitting: Internal Medicine

## 2017-12-29 ENCOUNTER — Ambulatory Visit (INDEPENDENT_AMBULATORY_CARE_PROVIDER_SITE_OTHER): Payer: Medicare Other | Admitting: Internal Medicine

## 2017-12-29 ENCOUNTER — Encounter: Payer: Self-pay | Admitting: Internal Medicine

## 2017-12-29 ENCOUNTER — Ambulatory Visit (INDEPENDENT_AMBULATORY_CARE_PROVIDER_SITE_OTHER): Payer: Medicare Other | Admitting: *Deleted

## 2017-12-29 VITALS — BP 126/72 | HR 67 | Temp 97.8°F | Ht 65.0 in | Wt 267.0 lb

## 2017-12-29 DIAGNOSIS — G4700 Insomnia, unspecified: Secondary | ICD-10-CM

## 2017-12-29 DIAGNOSIS — I10 Essential (primary) hypertension: Secondary | ICD-10-CM

## 2017-12-29 DIAGNOSIS — Z6841 Body Mass Index (BMI) 40.0 and over, adult: Secondary | ICD-10-CM

## 2017-12-29 DIAGNOSIS — Z0000 Encounter for general adult medical examination without abnormal findings: Secondary | ICD-10-CM | POA: Diagnosis not present

## 2017-12-29 DIAGNOSIS — E785 Hyperlipidemia, unspecified: Secondary | ICD-10-CM

## 2017-12-29 DIAGNOSIS — D649 Anemia, unspecified: Secondary | ICD-10-CM | POA: Diagnosis not present

## 2017-12-29 MED ORDER — ESCITALOPRAM OXALATE 5 MG PO TABS: 5.0000 mg | ORAL_TABLET | Freq: Every day | ORAL | 3 refills | Status: DC

## 2017-12-29 MED ORDER — CARVEDILOL 25 MG PO TABS: 25.0000 mg | ORAL_TABLET | Freq: Two times a day (BID) | ORAL | 3 refills | Status: DC

## 2017-12-29 MED ORDER — SPIRONOLACTONE-HCTZ 50-50 MG PO TABS: 1.0000 | ORAL_TABLET | Freq: Every day | ORAL | 3 refills | Status: DC

## 2017-12-29 MED ORDER — DEXLANSOPRAZOLE 60 MG PO CPDR: 1.0000 | DELAYED_RELEASE_CAPSULE | Freq: Every day | ORAL | 3 refills | Status: DC

## 2017-12-29 MED ORDER — LOSARTAN POTASSIUM 100 MG PO TABS: 100.0000 mg | ORAL_TABLET | Freq: Every day | ORAL | 3 refills | Status: DC

## 2017-12-29 MED ORDER — DOXAZOSIN MESYLATE 8 MG PO TABS: 8.0000 mg | ORAL_TABLET | Freq: Every day | ORAL | 3 refills | Status: DC

## 2017-12-29 NOTE — Progress Notes (Addendum)
Subjective:   Jennifer Gibson is a 67 y.o. female who presents for Medicare Annual (Subsequent) preventive examination.  Review of Systems:  No ROS.  Medicare Wellness Visit. Additional risk factors are reflected in the social history.  Cardiac Risk Factors include: advanced age (>69men, >55 women);diabetes mellitus;obesity (BMI >30kg/m2) Sleep patterns: feels rested on waking, gets up 1 times nightly to void and sleeps 7-8 hours nightly.    Home Safety/Smoke Alarms: Feels safe in home. Smoke alarms in place.  Living environment; residence and Firearm Safety: 1-story house/ trailer, no firearms. Lives with husband no needs for DME, good support system Seat Belt Safety/Bike Helmet: Wears seat belt.     Objective:     Vitals: There were no vitals taken for this visit.  There is no height or weight on file to calculate BMI.  Advanced Directives 12/29/2017 02/01/2016 12/21/2015  Does Patient Have a Medical Advance Directive? No No Yes  Copy of [place] in Chart? - - Yes  Would patient like information on creating a medical advance directive? Yes (ED - Information included in AVS) - -    Tobacco Social History   Tobacco Use  Smoking Status Never Smoker  Smokeless Tobacco Never Used  Tobacco Comment   alot of second hand smoke     Counseling given: Not Answered Comment: alot of second hand smoke  Past Medical History:  Diagnosis Date  . Allergy   . Anemia   . Anxiety   . Detached retina, right 2009  . Hypertension   . Morbid obesity ([place])   . Sleep apnea    doesnt wear  . Venous insufficiency of leg    right foot / ankle -sev.yeras ago   Past Surgical History:  Procedure Laterality Date  . ABDOMINAL HYSTERECTOMY     partial  . COLONOSCOPY    . POLYPECTOMY     Family History  Problem Relation Age of Onset  . Heart disease Father   . Hypertension Other   . Colon polyps Brother        benign  . Colon cancer Neg Hx   . Esophageal cancer Neg Hx    . Pancreatic cancer Neg Hx   . Rectal cancer Neg Hx   . Stomach cancer Neg Hx    Social History   Socioeconomic History  . Marital status: Married    Spouse name: Not on file  . Number of children: Not on file  . Years of education: Not on file  . Highest education level: Not on file  Social Needs  . Financial resource strain: Not hard at all  . Food insecurity - worry: Never true  . Food insecurity - inability: Never true  . Transportation needs - medical: No  . Transportation needs - non-medical: No  Occupational History  . Not on file  Tobacco Use  . Smoking status: Never Smoker  . Smokeless tobacco: Never Used  . Tobacco comment: alot of second hand smoke  Substance and Sexual Activity  . Alcohol use: No    Alcohol/week: 0.0 oz  . Drug use: No  . Sexual activity: Not on file  Other Topics Concern  . Not on file  Social History Narrative  . Not on file    Outpatient Encounter Medications as of 12/29/2017  Medication Sig  . carvedilol (COREG) 25 MG tablet Take 1 tablet (25 mg total) by mouth 2 (two) times daily with a meal. Patient needs office visit before refills will  be given  . Cetirizine HCl (ZYRTEC ALLERGY) 10 MG CAPS Take by mouth.  . Cholecalciferol (EQL VITAMIN D3) 1000 UNITS tablet Take 1,000 Units by mouth daily.    Marland Kitchen dexlansoprazole (DEXILANT) 60 MG capsule Take 1 capsule (60 mg total) by mouth daily.  Marland Kitchen doxazosin (CARDURA) 8 MG tablet Take 1 tablet (8 mg total) by mouth daily. Annual appt is due must see provider for future refills  . escitalopram (LEXAPRO) 5 MG tablet Take 1 tablet (5 mg total) by mouth daily. Patient needs office visit before refills will be given  . losartan (COZAAR) 100 MG tablet Take 1 tablet (100 mg total) by mouth daily. Patient needs office visit before refills will be given  . Multiple Vitamin (MULTIVITAMIN) capsule Take 1 capsule by mouth daily.  . Omega-3 Fatty Acids (OMEGA 3 PO) Take 1 capsule by mouth daily. Mega red  .  spironolactone-hydrochlorothiazide (ALDACTAZIDE) 50-50 MG tablet Take 1 tablet by mouth daily.  . [DISCONTINUED] albuterol (PROVENTIL HFA;VENTOLIN HFA) 108 (90 Base) MCG/ACT inhaler Inhale 1-2 puffs into the lungs every 6 (six) hours as needed for wheezing or shortness of breath.  . [DISCONTINUED] carvedilol (COREG) 25 MG tablet Take 1 tablet (25 mg total) by mouth 2 (two) times daily with a meal. Patient needs office visit before refills will be given  . [DISCONTINUED] DEXILANT 60 MG capsule TAKE 1 CAPSULE DAILY  . [DISCONTINUED] doxazosin (CARDURA) 8 MG tablet Take 1 tablet (8 mg total) by mouth daily. Annual appt is due must see provider for future refills  . [DISCONTINUED] escitalopram (LEXAPRO) 5 MG tablet Take 1 tablet (5 mg total) by mouth daily. Patient needs office visit before refills will be given  . [DISCONTINUED] losartan (COZAAR) 100 MG tablet Take 1 tablet (100 mg total) by mouth daily. Patient needs office visit before refills will be given  . [DISCONTINUED] naproxen (NAPROSYN) 500 MG tablet Take 1 tablet (500 mg total) by mouth 2 (two) times daily with a meal.  . [DISCONTINUED] Nutritional Supplements (ESTROVEN PO) Take 1 capsule by mouth daily.  . [DISCONTINUED] spironolactone (ALDACTONE) 50 MG tablet Take 1 tablet (50 mg total) by mouth daily.  . [DISCONTINUED] spironolactone-hydrochlorothiazide (ALDACTAZIDE) 50-50 MG tablet Take 1 tablet by mouth daily.   Facility-Administered Encounter Medications as of 12/29/2017  Medication  . pneumococcal 23 valent vaccine (PNU-IMMUNE) injection 0.5 mL    Activities of Daily Living In your present state of health, do you have any difficulty performing the following activities: 12/29/2017  Hearing? N  Vision? N  Difficulty concentrating or making decisions? N  Walking or climbing stairs? N  Dressing or bathing? N  Doing errands, shopping? N  Preparing Food and eating ? N  Using the Toilet? N  In the past six months, have you accidently  leaked urine? N  Do you have problems with loss of bowel control? N  Managing your Medications? N  Managing your Finances? N  Housekeeping or managing your Housekeeping? N  Some recent data might be hidden    Patient Care Team: Plotnikov, Evie Lacks, MD as PCP - General Louretta Shorten, MD as Registered Nurse (Obstetrics and Gynecology) Ladene Artist, MD (Gastroenterology)    Assessment:   This is a routine wellness examination for [place]. Physical assessment deferred to PCP.   Exercise Activities and Dietary recommendations Current Exercise Habits: The patient does not participate in regular exercise at present(chair exercise pamphlets provided)  Diet (meal preparation, eat out, water intake, caffeinated beverages, dairy products, fruits and  vegetables): in general, a "healthy" diet  , well balanced   Reviewed heart healthy diet, encouraged patient to increase daily water intake.  Goals    . Exercise 150 minutes per week (moderate activity)     [facility] and will check on water aerobics     . Patient Stated     Increase physical activity to maintain strength and keep my heart strong. Continue to worship God, love family, and enjoy life.       Fall Risk Fall Risk  12/29/2017 12/29/2017 05/23/2017 12/21/2015  Falls in the past year? No No No No  Comment - - Emmi Telephone Survey: data to providers prior to load -    Depression Screen PHQ 2/9 Scores 12/29/2017 12/29/2017 12/21/2015  PHQ - 2 Score 1 0 0  PHQ- 9 Score 2 - -     Cognitive Function MMSE - Mini Mental State Exam 12/21/2015  Not completed: (No Data)       Ad8 score reviewed for issues:  Issues making decisions: no  Less interest in hobbies / activities: no  Repeats questions, stories (family complaining): no  Trouble using ordinary gadgets (microwave, computer, phone):no  Forgets the month or year: no  Mismanaging finances: no  Remembering appts: no  Daily problems with  thinking and/or memory: no Ad8 score is= 0  Immunization History  Administered Date(s) Administered  . Influenza,inj,Quad PF,6+ Mos 08/02/2014, 08/01/2015  . Influenza-Unspecified 10/30/2016, 08/29/2017  . Pneumococcal Conjugate-13 08/02/2014, 08/29/2017  . Pneumococcal Polysaccharide-23 09/01/2014  . Td 11/06/1992, 06/30/2012  . Zoster 10/31/2014   Screening Tests Health Maintenance  Topic Date Due  . MAMMOGRAM  10/10/2017  . COLONOSCOPY  02/14/2021  . TETANUS/TDAP  06/30/2022  . INFLUENZA VACCINE  Completed  . DEXA SCAN  Completed  . Hepatitis C Screening  Completed  . PNA vac Low Risk Adult  Completed      Plan:   Please ask GYN to send results of mammogram and Bone density results to Dr. Alain Marion. [place] [place] Fax number: [phone]  Continue doing brain stimulating activities (puzzles, reading, adult coloring books, staying active) to keep memory sharp.   Continue to eat heart healthy diet (full of fruits, vegetables, whole grains, lean protein, water--limit salt, fat, and sugar intake) and increase physical activity as tolerated.  I have personally reviewed and noted the following in the patient's chart:   . Medical and social history . Use of alcohol, tobacco or illicit drugs  . Current medications and supplements . Functional ability and status . Nutritional status . Physical activity . Advanced directives . List of other physicians . Vitals . Screenings to include cognitive, depression, and falls . Referrals and appointments  In addition, I have reviewed and discussed with patient certain preventive protocols, quality metrics, and best practice recommendations. A written personalized care plan for preventive services as well as general preventive health recommendations were provided to patient.     Michiel Cowboy, RN  12/29/2017   Medical screening examination/treatment/procedure(s) were performed by non-physician practitioner and as supervising physician I was  immediately available for consultation/collaboration. I agree with above. Lew Dawes, MD

## 2017-12-29 NOTE — Progress Notes (Signed)
Subjective:  Patient ID: Jennifer Gibson, female    DOB: December 14, 1950  Age: 67 y.o. MRN: [id number]  CC: No chief complaint on file.   HPI Jennifer Gibson presents for HTN, depression, asthma f/u  Outpatient Medications Prior to Visit  Medication Sig Dispense Refill  . carvedilol (COREG) 25 MG tablet Take 1 tablet (25 mg total) by mouth 2 (two) times daily with a meal. Patient needs office visit before refills will be given 180 tablet 0  . Cetirizine HCl (ZYRTEC ALLERGY) 10 MG CAPS Take by mouth.    . Cholecalciferol (EQL VITAMIN D3) 1000 UNITS tablet Take 1,000 Units by mouth daily.      Marland Kitchen DEXILANT 60 MG capsule TAKE 1 CAPSULE DAILY 90 capsule 3  . doxazosin (CARDURA) 8 MG tablet Take 1 tablet (8 mg total) by mouth daily. Annual appt is due must see provider for future refills 30 tablet 0  . escitalopram (LEXAPRO) 5 MG tablet Take 1 tablet (5 mg total) by mouth daily. Patient needs office visit before refills will be given 90 tablet 0  . losartan (COZAAR) 100 MG tablet Take 1 tablet (100 mg total) by mouth daily. Patient needs office visit before refills will be given 90 tablet 0  . Multiple Vitamin (MULTIVITAMIN) capsule Take 1 capsule by mouth daily.    . Omega-3 Fatty Acids (OMEGA 3 PO) Take 1 capsule by mouth daily. Mega red    . spironolactone-hydrochlorothiazide (ALDACTAZIDE) 50-50 MG tablet Take 1 tablet by mouth daily. 90 tablet 3  . albuterol (PROVENTIL HFA;VENTOLIN HFA) 108 (90 Base) MCG/ACT inhaler Inhale 1-2 puffs into the lungs every 6 (six) hours as needed for wheezing or shortness of breath. 1 Inhaler 0  . naproxen (NAPROSYN) 500 MG tablet Take 1 tablet (500 mg total) by mouth 2 (two) times daily with a meal. 30 tablet 0  . Nutritional Supplements (ESTROVEN PO) Take 1 capsule by mouth daily.    Marland Kitchen spironolactone (ALDACTONE) 50 MG tablet Take 1 tablet (50 mg total) by mouth daily. 90 tablet 3   Facility-Administered Medications Prior to Visit  Medication Dose Route Frequency  Provider Last Rate Last Dose  . pneumococcal 23 valent vaccine (PNU-IMMUNE) injection 0.5 mL  0.5 mL Intramuscular Tomorrow-1000 Gibson, Jennifer V, MD        ROS Review of Systems  Constitutional: Negative for activity change, appetite change, chills, fatigue and unexpected weight change.  HENT: Negative for congestion, mouth sores and sinus pressure.   Eyes: Negative for visual disturbance.  Respiratory: Negative for cough and chest tightness.   Gastrointestinal: Negative for abdominal pain and nausea.  Genitourinary: Negative for difficulty urinating, frequency and vaginal pain.  Musculoskeletal: Negative for back pain and gait problem.  Skin: Negative for pallor and rash.  Neurological: Negative for dizziness, tremors, weakness, numbness and headaches.  Psychiatric/Behavioral: Negative for confusion and sleep disturbance.    Objective:  BP 126/72 (BP Location: Left Arm, Patient Position: Sitting, Cuff Size: Large)   Pulse 67   Temp 97.8 F (36.6 C) (Oral)   Ht 5\' 5"  (1.651 m)   Wt 267 lb (121.1 kg)   SpO2 98%   BMI 44.43 kg/m   BP Readings from Last 3 Encounters:  12/29/17 126/72  12/11/16 138/78  07/20/16 120/64    Wt Readings from Last 3 Encounters:  12/29/17 267 lb (121.1 kg)  12/11/16 279 lb (126.6 kg)  05/16/16 278 lb (126.1 kg)    Physical Exam  Constitutional: She appears well-developed. No  distress.  HENT:  Head: Normocephalic.  Right Ear: External ear normal.  Left Ear: External ear normal.  Nose: Nose normal.  Mouth/Throat: Oropharynx is clear and moist.  Eyes: Conjunctivae are normal. Pupils are equal, round, and reactive to light. Right eye exhibits no discharge. Left eye exhibits no discharge.  Neck: Normal range of motion. Neck supple. No JVD present. No tracheal deviation present. No thyromegaly present.  Cardiovascular: Normal rate, regular rhythm and normal heart sounds.  Pulmonary/Chest: No stridor. No respiratory distress. She has no  wheezes.  Abdominal: Soft. Bowel sounds are normal. She exhibits no distension and no mass. There is no tenderness. There is no rebound and no guarding.  Musculoskeletal: She exhibits tenderness. She exhibits no edema.  Lymphadenopathy:    She has no cervical adenopathy.  Neurological: She displays normal reflexes. No cranial nerve deficit. She exhibits normal muscle tone. Coordination normal.  Skin: No rash noted. No erythema.  Psychiatric: She has a normal mood and affect. Her behavior is normal. Judgment and thought content normal.  Obese LS tender  Lab Results  Component Value Date   WBC 5.4 02/27/2016   HGB 10.6 (L) 02/27/2016   HCT 32.9 (L) 02/27/2016   PLT 196.0 02/27/2016   GLUCOSE 102 (H) 02/27/2016   CHOL 156 02/27/2016   TRIG 88.0 02/27/2016   HDL 46.30 02/27/2016   LDLCALC 92 02/27/2016   ALT 15 02/27/2016   AST 17 02/27/2016   NA 139 02/27/2016   K 3.9 02/27/2016   CL 105 02/27/2016   CREATININE 0.89 02/27/2016   BUN 14 02/27/2016   CO2 28 02/27/2016   TSH 1.20 02/27/2016   HGBA1C 6.0 03/11/2014    No results found.  Assessment & Plan:   There are no diagnoses linked to this encounter. I have discontinued Jennifer Gibson Nutritional Supplements (ESTROVEN PO), naproxen, albuterol, and spironolactone. I am also having her maintain her Cholecalciferol, Cetirizine HCl, multivitamin, Omega-3 Fatty Acids (OMEGA 3 PO), DEXILANT, spironolactone-hydrochlorothiazide, carvedilol, escitalopram, losartan, and doxazosin. We will continue to administer pneumococcal 23 valent vaccine.  No orders of the defined types were placed in this encounter.    Follow-up: No Follow-up on file.  Walker Kehr, MD

## 2017-12-29 NOTE — Assessment & Plan Note (Signed)
Coreg, Spironolactone, Doxazosin, Losartan

## 2017-12-29 NOTE — Patient Instructions (Addendum)
Please ask GYN to send results of mammogram and Bone density results to Dr. Alain Marion. Jennifer Gibson Fax number: [phone]  Continue doing brain stimulating activities (puzzles, reading, adult coloring books, staying active) to keep memory sharp.   Continue to eat heart healthy diet (full of fruits, vegetables, whole grains, lean protein, water--limit salt, fat, and sugar intake) and increase physical activity as tolerated.  [name] , Thank you for taking time to come for your Medicare Wellness Visit. I appreciate your ongoing commitment to your health goals. Please review the following plan we discussed and let me know if I can assist you in the future.   These are the goals we discussed: Goals    . Exercise 150 minutes per week (moderate activity)     [facility] minutes and will check on water aerobics     . Patient Stated     Increase physical activity to maintain strength and keep my heart strong. Continue to worship God, love family, and enjoy life.       This is a list of the screening recommended for you and due dates:  Health Maintenance  Topic Date Due  . Mammogram  10/10/2017  . Colon Cancer Screening  02/14/2021  . Tetanus Vaccine  06/30/2022  . Flu Shot  Completed  . DEXA scan (bone density measurement)  Completed  .  Hepatitis C: One time screening is recommended by Center for Disease Control  (CDC) for  adults born from [birth year] through 1967.   Completed  . Pneumonia vaccines  Completed

## 2017-12-29 NOTE — Assessment & Plan Note (Signed)
Discussed  Advil prn

## 2017-12-29 NOTE — Assessment & Plan Note (Signed)
Wt Readings from Last 3 Encounters:  12/29/17 267 lb (121.1 kg)  12/11/16 279 lb (126.6 kg)  05/16/16 278 lb (126.1 kg)

## 2017-12-29 NOTE — Assessment & Plan Note (Signed)
CBC

## 2017-12-30 DIAGNOSIS — Z01419 Encounter for gynecological examination (general) (routine) without abnormal findings: Secondary | ICD-10-CM | POA: Diagnosis not present

## 2017-12-30 DIAGNOSIS — Z6841 Body Mass Index (BMI) 40.0 and over, adult: Secondary | ICD-10-CM | POA: Diagnosis not present

## 2017-12-30 DIAGNOSIS — Z1231 Encounter for screening mammogram for malignant neoplasm of breast: Secondary | ICD-10-CM | POA: Diagnosis not present

## 2018-01-09 ENCOUNTER — Other Ambulatory Visit (INDEPENDENT_AMBULATORY_CARE_PROVIDER_SITE_OTHER): Payer: Medicare Other

## 2018-01-09 DIAGNOSIS — I10 Essential (primary) hypertension: Secondary | ICD-10-CM

## 2018-01-09 DIAGNOSIS — D649 Anemia, unspecified: Secondary | ICD-10-CM | POA: Diagnosis not present

## 2018-01-09 DIAGNOSIS — E785 Hyperlipidemia, unspecified: Secondary | ICD-10-CM

## 2018-01-09 DIAGNOSIS — R1032 Left lower quadrant pain: Secondary | ICD-10-CM | POA: Diagnosis not present

## 2018-01-09 LAB — URINALYSIS
Bilirubin Urine: NEGATIVE
Hgb urine dipstick: NEGATIVE
Ketones, ur: NEGATIVE
Leukocytes, UA: NEGATIVE
NITRITE: NEGATIVE
SPECIFIC GRAVITY, URINE: 1.015 (ref 1.000–1.030)
TOTAL PROTEIN, URINE-UPE24: NEGATIVE
URINE GLUCOSE: NEGATIVE
Urobilinogen, UA: 0.2 (ref 0.0–1.0)
pH: 6 (ref 5.0–8.0)

## 2018-01-09 LAB — CBC WITH DIFFERENTIAL/PLATELET
BASOS PCT: 0.9 % (ref 0.0–3.0)
Basophils Absolute: 0 10*3/uL (ref 0.0–0.1)
EOS PCT: 2.3 % (ref 0.0–5.0)
Eosinophils Absolute: 0.1 10*3/uL (ref 0.0–0.7)
HEMATOCRIT: 32.2 % — AB (ref 36.0–46.0)
HEMOGLOBIN: 10.5 g/dL — AB (ref 12.0–15.0)
LYMPHS PCT: 24.3 % (ref 12.0–46.0)
Lymphs Abs: 1.3 10*3/uL (ref 0.7–4.0)
MCHC: 32.6 g/dL (ref 30.0–36.0)
MCV: 80.7 fl (ref 78.0–100.0)
MONOS PCT: 10.4 % (ref 3.0–12.0)
Monocytes Absolute: 0.6 10*3/uL (ref 0.1–1.0)
NEUTROS ABS: 3.4 10*3/uL (ref 1.4–7.7)
Neutrophils Relative %: 62.1 % (ref 43.0–77.0)
PLATELETS: 272 10*3/uL (ref 150.0–400.0)
RBC: 3.99 Mil/uL (ref 3.87–5.11)
RDW: 13.5 % (ref 11.5–15.5)
WBC: 5.5 10*3/uL (ref 4.0–10.5)

## 2018-01-09 LAB — BASIC METABOLIC PANEL
BUN: 19 mg/dL (ref 6–23)
CALCIUM: 9.8 mg/dL (ref 8.4–10.5)
CHLORIDE: 103 meq/L (ref 96–112)
CO2: 30 mEq/L (ref 19–32)
CREATININE: 1.1 mg/dL (ref 0.40–1.20)
GFR: 63.32 mL/min (ref >60.00)
Glucose, Bld: 88 mg/dL (ref 70–99)
Potassium: 4 mEq/L (ref 3.5–5.1)
Sodium: 139 mEq/L (ref 135–145)

## 2018-01-09 LAB — LIPID PANEL
CHOLESTEROL: 165 mg/dL (ref 0–200)
HDL: 45.4 mg/dL (ref >39.00)
LDL CALC: 105 mg/dL — AB (ref 0–99)
NonHDL: 119.59
TRIGLYCERIDES: 73 mg/dL (ref 0.0–149.0)
Total CHOL/HDL Ratio: 4
VLDL: 14.6 mg/dL (ref 0.0–40.0)

## 2018-01-09 LAB — HEPATIC FUNCTION PANEL
ALBUMIN: 4 g/dL (ref 3.5–5.2)
ALK PHOS: 58 U/L (ref 39–117)
ALT: 10 U/L (ref 0–35)
AST: 12 U/L (ref 0–37)
Bilirubin, Direct: 0 mg/dL (ref 0.0–0.3)
Total Bilirubin: 0.4 mg/dL (ref 0.2–1.2)
Total Protein: 7.5 g/dL (ref 6.0–8.3)

## 2018-01-09 LAB — TSH: TSH: 1.61 u[IU]/mL (ref 0.35–4.50)

## 2018-06-29 ENCOUNTER — Encounter: Payer: Self-pay | Admitting: Internal Medicine

## 2018-06-29 ENCOUNTER — Ambulatory Visit (INDEPENDENT_AMBULATORY_CARE_PROVIDER_SITE_OTHER): Payer: Medicare Other | Admitting: Internal Medicine

## 2018-06-29 ENCOUNTER — Other Ambulatory Visit (INDEPENDENT_AMBULATORY_CARE_PROVIDER_SITE_OTHER): Payer: Medicare Other

## 2018-06-29 DIAGNOSIS — G4700 Insomnia, unspecified: Secondary | ICD-10-CM | POA: Diagnosis not present

## 2018-06-29 DIAGNOSIS — I10 Essential (primary) hypertension: Secondary | ICD-10-CM

## 2018-06-29 DIAGNOSIS — Z6841 Body Mass Index (BMI) 40.0 and over, adult: Secondary | ICD-10-CM | POA: Diagnosis not present

## 2018-06-29 DIAGNOSIS — D649 Anemia, unspecified: Secondary | ICD-10-CM

## 2018-06-29 LAB — BASIC METABOLIC PANEL
BUN: 25 mg/dL — ABNORMAL HIGH (ref 6–23)
CALCIUM: 9.8 mg/dL (ref 8.4–10.5)
CHLORIDE: 104 meq/L (ref 96–112)
CO2: 27 meq/L (ref 19–32)
Creatinine, Ser: 1.53 mg/dL — ABNORMAL HIGH (ref 0.40–1.20)
GFR: 43.21 mL/min — ABNORMAL LOW (ref >60.00)
GLUCOSE: 108 mg/dL — AB (ref 70–99)
Potassium: 4.2 mEq/L (ref 3.5–5.1)
Sodium: 138 mEq/L (ref 135–145)

## 2018-06-29 LAB — CBC WITH DIFFERENTIAL/PLATELET
BASOS PCT: 0.4 % (ref 0.0–3.0)
Basophils Absolute: 0 10*3/uL (ref 0.0–0.1)
EOS PCT: 2.4 % (ref 0.0–5.0)
Eosinophils Absolute: 0.1 10*3/uL (ref 0.0–0.7)
HCT: 31.2 % — ABNORMAL LOW (ref 36.0–46.0)
Hemoglobin: 10 g/dL — ABNORMAL LOW (ref 12.0–15.0)
LYMPHS ABS: 1.4 10*3/uL (ref 0.7–4.0)
Lymphocytes Relative: 27 % (ref 12.0–46.0)
MCHC: 32.2 g/dL (ref 30.0–36.0)
MCV: 80.8 fl (ref 78.0–100.0)
Monocytes Absolute: 0.6 10*3/uL (ref 0.1–1.0)
Monocytes Relative: 12.1 % — ABNORMAL HIGH (ref 3.0–12.0)
NEUTROS ABS: 3.1 10*3/uL (ref 1.4–7.7)
NEUTROS PCT: 58.1 % (ref 43.0–77.0)
PLATELETS: 287 10*3/uL (ref 150.0–400.0)
RBC: 3.86 Mil/uL — ABNORMAL LOW (ref 3.87–5.11)
RDW: 13.1 % (ref 11.5–15.5)
WBC: 5.3 10*3/uL (ref 4.0–10.5)

## 2018-06-29 MED ORDER — ESCITALOPRAM OXALATE 5 MG PO TABS
5.0000 mg | ORAL_TABLET | Freq: Every day | ORAL | 3 refills | Status: DC
Start: 2018-06-29 — End: 2019-04-06

## 2018-06-29 NOTE — Assessment & Plan Note (Signed)
CBC, iron 

## 2018-06-29 NOTE — Assessment & Plan Note (Signed)
Sleeping better

## 2018-06-29 NOTE — Progress Notes (Signed)
Subjective:  Patient ID: [name], female    DOB: [birth year]  Age: 67 y.o. MRN: [id number]  CC: No chief complaint on file.   HPI Jennifer Gibson presents for stress, HTN, GERD f/u  Outpatient Medications Prior to Visit  Medication Sig Dispense Refill  . carvedilol (COREG) 25 MG tablet Take 1 tablet (25 mg total) by mouth 2 (two) times daily with a meal. Patient needs office visit before refills will be given 180 tablet 3  . Cetirizine HCl (ZYRTEC ALLERGY) 10 MG CAPS Take by mouth.    . Cholecalciferol (EQL VITAMIN D3) 1000 UNITS tablet Take 1,000 Units by mouth daily.      Marland Kitchen dexlansoprazole (DEXILANT) 60 MG capsule Take 1 capsule (60 mg total) by mouth daily. 90 capsule 3  . doxazosin (CARDURA) 8 MG tablet Take 1 tablet (8 mg total) by mouth daily. Annual appt is due must see provider for future refills 90 tablet 3  . escitalopram (LEXAPRO) 5 MG tablet Take 1 tablet (5 mg total) by mouth daily. Patient needs office visit before refills will be given 90 tablet 3  . losartan (COZAAR) 100 MG tablet Take 1 tablet (100 mg total) by mouth daily. Patient needs office visit before refills will be given 90 tablet 3  . Multiple Vitamin (MULTIVITAMIN) capsule Take 1 capsule by mouth daily.    . Omega-3 Fatty Acids (OMEGA 3 PO) Take 1 capsule by mouth daily. Mega red    . spironolactone-hydrochlorothiazide (ALDACTAZIDE) 50-50 MG tablet Take 1 tablet by mouth daily. 90 tablet 3   Facility-Administered Medications Prior to Visit  Medication Dose Route Frequency Provider Last Rate Last Dose  . pneumococcal 23 valent vaccine (PNU-IMMUNE) injection 0.5 mL  0.5 mL Intramuscular Tomorrow-1000 Plotnikov, Aleksei V, MD        ROS: Review of Systems  Constitutional: Positive for fatigue and unexpected weight change. Negative for activity change, appetite change and chills.  HENT: Negative for congestion, mouth sores and sinus pressure.   Eyes: Negative for visual disturbance.  Respiratory: Negative  for cough and chest tightness.   Gastrointestinal: Negative for abdominal pain and nausea.  Genitourinary: Negative for difficulty urinating, frequency and vaginal pain.  Musculoskeletal: Negative for back pain and gait problem.  Skin: Negative for pallor and rash.  Neurological: Negative for dizziness, tremors, weakness, numbness and headaches.  Psychiatric/Behavioral: Negative for confusion, sleep disturbance and suicidal ideas. The patient is nervous/anxious.     Objective:  BP 128/76 (BP Location: Left Arm, Patient Position: Sitting, Cuff Size: Large)   Pulse 71   Temp 98 F (36.7 C) (Oral)   Ht 5\' 5"  (1.651 m)   Wt 272 lb (123.4 kg)   SpO2 96%   BMI 45.26 kg/m   BP Readings from Last 3 Encounters:  06/29/18 128/76  12/29/17 126/72  12/11/16 138/78    Wt Readings from Last 3 Encounters:  06/29/18 272 lb (123.4 kg)  12/29/17 267 lb (121.1 kg)  12/11/16 279 lb (126.6 kg)    Physical Exam  Constitutional: She appears well-developed. No distress.  HENT:  Head: Normocephalic.  Right Ear: External ear normal.  Left Ear: External ear normal.  Nose: Nose normal.  Mouth/Throat: Oropharynx is clear and moist.  Eyes: Pupils are equal, round, and reactive to light. Conjunctivae are normal. Right eye exhibits no discharge. Left eye exhibits no discharge.  Neck: Normal range of motion. Neck supple. No JVD present. No tracheal deviation present. No thyromegaly present.  Cardiovascular: Normal rate,  regular rhythm and normal heart sounds.  Pulmonary/Chest: No stridor. No respiratory distress. She has no wheezes.  Abdominal: Soft. Bowel sounds are normal. She exhibits no distension and no mass. There is no tenderness. There is no rebound and no guarding.  Musculoskeletal: She exhibits no edema or tenderness.  Lymphadenopathy:    She has no cervical adenopathy.  Neurological: She displays normal reflexes. No cranial nerve deficit. She exhibits normal muscle tone. Coordination  normal.  Skin: No rash noted. No erythema.  Psychiatric: She has a normal mood and affect. Her behavior is normal. Judgment and thought content normal.  Obese  Lab Results  Component Value Date   WBC 5.5 01/09/2018   HGB 10.5 (L) 01/09/2018   HCT 32.2 (L) 01/09/2018   PLT 272.0 01/09/2018   GLUCOSE 88 01/09/2018   CHOL 165 01/09/2018   TRIG 73.0 01/09/2018   HDL 45.40 01/09/2018   LDLCALC 105 (H) 01/09/2018   ALT 10 01/09/2018   AST 12 01/09/2018   NA 139 01/09/2018   K 4.0 01/09/2018   CL 103 01/09/2018   CREATININE 1.10 01/09/2018   BUN 19 01/09/2018   CO2 30 01/09/2018   TSH 1.61 01/09/2018   HGBA1C 6.0 03/11/2014    No results found.  Assessment & Plan:   There are no diagnoses linked to this encounter.   No orders of the defined types were placed in this encounter.    Follow-up: No follow-ups on file.  Walker Kehr, MD

## 2018-06-29 NOTE — Assessment & Plan Note (Signed)
Declined Dr Migdalia Dk consult 2019 Low carb diet

## 2018-06-29 NOTE — Assessment & Plan Note (Signed)
Coreg, Spironolactone, Doxazosin, Losartan

## 2018-06-30 LAB — IRON,TIBC AND FERRITIN PANEL
%SAT: 27 % (ref 16–45)
FERRITIN: 116 ng/mL (ref 16–288)
Iron: 85 ug/dL (ref 45–160)
TIBC: 314 mcg/dL (calc) (ref 250–450)

## 2018-07-23 MED ORDER — LOSARTAN POTASSIUM 100 MG PO TABS: 100.0000 mg | ORAL_TABLET | Freq: Every day | ORAL | 3 refills | Status: DC

## 2018-12-24 ENCOUNTER — Other Ambulatory Visit: Payer: Self-pay | Admitting: Internal Medicine

## 2018-12-31 ENCOUNTER — Ambulatory Visit (INDEPENDENT_AMBULATORY_CARE_PROVIDER_SITE_OTHER): Payer: Medicare Other | Admitting: Internal Medicine

## 2018-12-31 ENCOUNTER — Encounter: Payer: Self-pay | Admitting: Internal Medicine

## 2018-12-31 ENCOUNTER — Other Ambulatory Visit (INDEPENDENT_AMBULATORY_CARE_PROVIDER_SITE_OTHER): Payer: Medicare Other

## 2018-12-31 VITALS — BP 124/72 | HR 82 | Temp 98.0°F | Ht 65.0 in | Wt 274.0 lb

## 2018-12-31 DIAGNOSIS — Z23 Encounter for immunization: Secondary | ICD-10-CM

## 2018-12-31 DIAGNOSIS — N183 Chronic kidney disease, stage 3 unspecified: Secondary | ICD-10-CM | POA: Insufficient documentation

## 2018-12-31 DIAGNOSIS — Z6841 Body Mass Index (BMI) 40.0 and over, adult: Secondary | ICD-10-CM

## 2018-12-31 DIAGNOSIS — Z0000 Encounter for general adult medical examination without abnormal findings: Secondary | ICD-10-CM | POA: Diagnosis not present

## 2018-12-31 DIAGNOSIS — I10 Essential (primary) hypertension: Secondary | ICD-10-CM

## 2018-12-31 DIAGNOSIS — E66813 Obesity, class 3: Secondary | ICD-10-CM

## 2018-12-31 DIAGNOSIS — D649 Anemia, unspecified: Secondary | ICD-10-CM

## 2018-12-31 DIAGNOSIS — F4321 Adjustment disorder with depressed mood: Secondary | ICD-10-CM

## 2018-12-31 DIAGNOSIS — N2889 Other specified disorders of kidney and ureter: Secondary | ICD-10-CM

## 2018-12-31 LAB — BASIC METABOLIC PANEL
BUN: 17 mg/dL (ref 6–23)
CHLORIDE: 101 meq/L (ref 96–112)
CO2: 30 mEq/L (ref 19–32)
Calcium: 10 mg/dL (ref 8.4–10.5)
Creatinine, Ser: 1.15 mg/dL (ref 0.40–1.20)
GFR: 56.43 mL/min — ABNORMAL LOW (ref >60.00)
Glucose, Bld: 79 mg/dL (ref 70–99)
POTASSIUM: 4.2 meq/L (ref 3.5–5.1)
Sodium: 138 mEq/L (ref 135–145)

## 2018-12-31 LAB — CBC WITH DIFFERENTIAL/PLATELET
Basophils Absolute: 0 10*3/uL (ref 0.0–0.1)
Basophils Relative: 0.5 % (ref 0.0–3.0)
Eosinophils Absolute: 0.1 10*3/uL (ref 0.0–0.7)
Eosinophils Relative: 1.4 % (ref 0.0–5.0)
HCT: 32.2 % — ABNORMAL LOW (ref 36.0–46.0)
Hemoglobin: 10.4 g/dL — ABNORMAL LOW (ref 12.0–15.0)
LYMPHS PCT: 25.3 % (ref 12.0–46.0)
Lymphs Abs: 1.5 10*3/uL (ref 0.7–4.0)
MCHC: 32.4 g/dL (ref 30.0–36.0)
MCV: 80.6 fl (ref 78.0–100.0)
Monocytes Absolute: 0.6 10*3/uL (ref 0.1–1.0)
Monocytes Relative: 10.7 % (ref 3.0–12.0)
Neutro Abs: 3.8 10*3/uL (ref 1.4–7.7)
Neutrophils Relative %: 62.1 % (ref 43.0–77.0)
Platelets: 319 10*3/uL (ref 150.0–400.0)
RBC: 3.99 Mil/uL (ref 3.87–5.11)
RDW: 13.6 % (ref 11.5–15.5)
WBC: 6 10*3/uL (ref 4.0–10.5)

## 2018-12-31 NOTE — Assessment & Plan Note (Signed)
Chronic - ?renal Iron tests - nl

## 2018-12-31 NOTE — Assessment & Plan Note (Signed)
Husband died 12/19 - discussed - in counseling

## 2018-12-31 NOTE — Addendum Note (Signed)
Addended by: Karren Cobble on: 12/31/2018 01:40 PM   Modules accepted: Orders

## 2018-12-31 NOTE — Assessment & Plan Note (Signed)
Renal US 

## 2018-12-31 NOTE — Assessment & Plan Note (Signed)
A cardiac CT scan for calcium scoring offered 2/20

## 2018-12-31 NOTE — Patient Instructions (Addendum)
Jennifer Gibson --- The Obesity Code   Cardiac CT calcium scoring test $150   Computed tomography, more commonly known as a CT or CAT scan, is a diagnostic medical imaging test. Like traditional x-rays, it produces multiple images or pictures of the inside of the body. The cross-sectional images generated during a CT scan can be reformatted in multiple planes. They can even generate three-dimensional images. These images can be viewed on a computer monitor, printed on film or by a 3D printer, or transferred to a CD or DVD. CT images of internal organs, bones, soft tissue and blood vessels provide greater detail than traditional x-rays, particularly of soft tissues and blood vessels. A cardiac CT scan for coronary calcium is a non-invasive way of obtaining information about the presence, location and extent of calcified plaque in the coronary arteries-the vessels that supply oxygen-containing blood to the heart muscle. Calcified plaque results when there is a build-up of fat and other substances under the inner layer of the artery. This material can calcify which signals the presence of atherosclerosis, a disease of the vessel wall, also called coronary artery disease (CAD). People with this disease have an increased risk for heart attacks. In addition, over time, progression of plaque build up (CAD) can narrow the arteries or even close off blood flow to the heart. The result may be chest pain, sometimes called "angina," or a heart attack. Because calcium is a marker of CAD, the amount of calcium detected on a cardiac CT scan is a helpful prognostic tool. The findings on cardiac CT are expressed as a calcium score. Another name for this test is coronary artery calcium scoring.  What are some common uses of the procedure? The goal of cardiac CT scan for calcium scoring is to determine if CAD is present and to what extent, even if there are no symptoms. It is a screening study that may be recommended by a  physician for patients with risk factors for CAD but no clinical symptoms. The major risk factors for CAD are: . high blood cholesterol levels  . family history of heart attacks  . diabetes  . high blood pressure  . cigarette smoking  . overweight or obese  . physical inactivity   A negative cardiac CT scan for calcium scoring shows no calcification within the coronary arteries. This suggests that CAD is absent or so minimal it cannot be seen by this technique. The chance of having a heart attack over the next two to five years is very low under these circumstances. A positive test means that CAD is present, regardless of whether or not the patient is experiencing any symptoms. The amount of calcification-expressed as the calcium score-may help to predict the likelihood of a myocardial infarction (heart attack) in the coming years and helps your medical doctor or cardiologist decide whether the patient may need to take preventive medicine or undertake other measures such as diet and exercise to lower the risk for heart attack. The extent of CAD is graded according to your calcium score:  Calcium Score  Presence of CAD  0 No evidence of CAD   1-10 Minimal evidence of CAD  11-100 Mild evidence of CAD  101-400 Moderate evidence of CAD  Over 400 Extensive evidence of CAD

## 2018-12-31 NOTE — Progress Notes (Signed)
Subjective:  Patient ID: Jennifer Gibson, female    DOB: 1951/03/07  Age: 68 y.o. MRN: [id number]  CC: No chief complaint on file.   HPI Jennifer Gibson presents for grieving, HTN, depression f/u  Outpatient Medications Prior to Visit  Medication Sig Dispense Refill  . carvedilol (COREG) 25 MG tablet Take 1 tablet (25 mg total) by mouth 2 (two) times daily with a meal. Patient needs office visit before refills will be given 180 tablet 3  . Cetirizine HCl (ZYRTEC ALLERGY) 10 MG CAPS Take by mouth.    . Cholecalciferol (EQL VITAMIN D3) 1000 UNITS tablet Take 1,000 Units by mouth daily.      Marland Kitchen dexlansoprazole (DEXILANT) 60 MG capsule Take 1 capsule (60 mg total) by mouth daily. 90 capsule 3  . doxazosin (CARDURA) 8 MG tablet Take 1 tablet (8 mg total) by mouth daily. 90 tablet 0  . escitalopram (LEXAPRO) 5 MG tablet Take 1 tablet (5 mg total) by mouth daily. Patient needs office visit before refills will be given 90 tablet 3  . losartan (COZAAR) 100 MG tablet Take 1 tablet (100 mg total) by mouth daily. 90 tablet 3  . Multiple Vitamin (MULTIVITAMIN) capsule Take 1 capsule by mouth daily.    . Omega-3 Fatty Acids (OMEGA 3 PO) Take 1 capsule by mouth daily. Mega red    . spironolactone-hydrochlorothiazide (ALDACTAZIDE) 50-50 MG tablet Take 1 tablet by mouth daily. 90 tablet 3   Facility-Administered Medications Prior to Visit  Medication Dose Route Frequency Provider Last Rate Last Dose  . pneumococcal 23 valent vaccine (PNU-IMMUNE) injection 0.5 mL  0.5 mL Intramuscular Tomorrow-1000 Hollynn Garno V, MD        ROS: Review of Systems  Constitutional: Negative for activity change, appetite change, chills, fatigue and unexpected weight change.  HENT: Negative for congestion, mouth sores and sinus pressure.   Eyes: Negative for visual disturbance.  Respiratory: Negative for cough and chest tightness.   Gastrointestinal: Negative for abdominal pain and nausea.  Genitourinary: Negative for  difficulty urinating, frequency and vaginal pain.  Musculoskeletal: Negative for back pain and gait problem.  Skin: Negative for pallor and rash.  Neurological: Negative for dizziness, tremors, weakness, numbness and headaches.  Psychiatric/Behavioral: Negative for confusion, sleep disturbance and suicidal ideas.    Objective:  BP 124/72 (BP Location: Left Arm, Patient Position: Sitting, Cuff Size: Large)   Pulse 82   Temp 98 F (36.7 C) (Oral)   Ht 5\' 5"  (1.651 m)   Wt 274 lb (124.3 kg)   SpO2 95%   BMI 45.60 kg/m   BP Readings from Last 3 Encounters:  12/31/18 124/72  06/29/18 128/76  12/29/17 126/72    Wt Readings from Last 3 Encounters:  12/31/18 274 lb (124.3 kg)  06/29/18 272 lb (123.4 kg)  12/29/17 267 lb (121.1 kg)    Physical Exam Constitutional:      General: She is not in acute distress.    Appearance: She is well-developed.  HENT:     Head: Normocephalic.     Right Ear: External ear normal.     Left Ear: External ear normal.     Nose: Nose normal.  Eyes:     General:        Right eye: No discharge.        Left eye: No discharge.     Conjunctiva/sclera: Conjunctivae normal.     Pupils: Pupils are equal, round, and reactive to light.  Neck:  Musculoskeletal: Normal range of motion and neck supple.     Thyroid: No thyromegaly.     Vascular: No JVD.     Trachea: No tracheal deviation.  Cardiovascular:     Rate and Rhythm: Normal rate and regular rhythm.     Heart sounds: Normal heart sounds.  Pulmonary:     Effort: No respiratory distress.     Breath sounds: No stridor. No wheezing.  Abdominal:     General: Bowel sounds are normal. There is no distension.     Palpations: Abdomen is soft. There is no mass.     Tenderness: There is no abdominal tenderness. There is no guarding or rebound.  Musculoskeletal:        General: No tenderness.  Lymphadenopathy:     Cervical: No cervical adenopathy.  Skin:    Findings: No erythema or rash.    Neurological:     Cranial Nerves: No cranial nerve deficit.     Motor: No abnormal muscle tone.     Coordination: Coordination normal.     Deep Tendon Reflexes: Reflexes normal.  Psychiatric:        Behavior: Behavior normal.        Thought Content: Thought content normal.        Judgment: Judgment normal.   obese  Lab Results  Component Value Date   WBC 5.3 06/29/2018   HGB 10.0 (L) 06/29/2018   HCT 31.2 (L) 06/29/2018   PLT 287.0 06/29/2018   GLUCOSE 108 (H) 06/29/2018   CHOL 165 01/09/2018   TRIG 73.0 01/09/2018   HDL 45.40 01/09/2018   LDLCALC 105 (H) 01/09/2018   ALT 10 01/09/2018   AST 12 01/09/2018   NA 138 06/29/2018   K 4.2 06/29/2018   CL 104 06/29/2018   CREATININE 1.53 (H) 06/29/2018   BUN 25 (H) 06/29/2018   CO2 27 06/29/2018   TSH 1.61 01/09/2018   HGBA1C 6.0 03/11/2014    No results found.  Assessment & Plan:   There are no diagnoses linked to this encounter.   No orders of the defined types were placed in this encounter.    Follow-up: No follow-ups on file.  Walker Kehr, MD

## 2018-12-31 NOTE — Assessment & Plan Note (Signed)
Jason Fung "The Obesity Code" 

## 2019-01-01 NOTE — Addendum Note (Signed)
Addended by: Karren Cobble on: 01/01/2019 11:06 AM   Modules accepted: Orders

## 2019-01-08 ENCOUNTER — Other Ambulatory Visit: Payer: Self-pay | Admitting: Internal Medicine

## 2019-01-08 ENCOUNTER — Ambulatory Visit
Admission: RE | Admit: 2019-01-08 | Discharge: 2019-01-08 | Disposition: A | Discharge status: Home / Self Care | Payer: Medicare Other | Source: Ambulatory Visit | Attending: Internal Medicine | Admitting: Internal Medicine

## 2019-01-08 DIAGNOSIS — N183 Chronic kidney disease, stage 3 unspecified: Secondary | ICD-10-CM

## 2019-01-08 DIAGNOSIS — N189 Chronic kidney disease, unspecified: Secondary | ICD-10-CM | POA: Diagnosis not present

## 2019-01-19 ENCOUNTER — Other Ambulatory Visit: Payer: Self-pay | Admitting: Internal Medicine

## 2019-01-31 ENCOUNTER — Other Ambulatory Visit: Payer: Self-pay | Admitting: Internal Medicine

## 2019-02-22 ENCOUNTER — Other Ambulatory Visit: Payer: Self-pay | Admitting: Internal Medicine

## 2019-02-22 MED ORDER — PANTOPRAZOLE SODIUM 40 MG PO TBEC: 40.0000 mg | DELAYED_RELEASE_TABLET | Freq: Every day | ORAL | 3 refills | Status: DC

## 2019-03-04 ENCOUNTER — Ambulatory Visit: Payer: Self-pay | Admitting: *Deleted

## 2019-03-04 NOTE — Telephone Encounter (Signed)
Noted. Thx.

## 2019-03-04 NOTE — Telephone Encounter (Signed)
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 She does wear protective gear and she does take precautions- but she says with her mother when she is in town to do this. Mother wants to know if she needs to quarantine.  Daughter is back at her home quarantined without symptoms- told patient she should be fine- she had limited exposure to her and precautions were in place. Patient will call if she develops any symptoms.  Reason for Disposition . [1] No COVID-19 EXPOSURE BUT [2] living with someone who was exposed and who has no fever or cough  Answer Assessment - Initial Assessment Questions 1. CLOSE CONTACT: "Who is the person with the confirmed or suspected COVID-19 infection that you were exposed to?"     Daughter works at [employer], clinical- + workers 2. PLACE of CONTACT: "Where were you when you were exposed to COVID-19?" (e.g., home, school, medical waiting room; which city?)     Daughter stayed with her 2 days while working 3. TYPE of CONTACT: "How much contact was there?" (e.g., sitting next to, live in same house, work in same office, same building)     Same house 4. DURATION of CONTACT: "How long were you in contact with the COVID-19 patient?" (e.g., a few seconds, passed by person, a few minutes, live with the patient)     Lives with 5. DATE of CONTACT: "When did you have contact with a COVID-19 patient?" (e.g., how many days ago)     4/28,4/29 6. TRAVEL: "Have you traveled out of the country recently?" If so, "When and where?"     * Also ask about out-of-state travel, since the CDC has identified some high risk cities for community spread in the [place].     * Note: Travel becomes less relevant if there is widespread community transmission where the patient lives.     No travel 7. COMMUNITY SPREAD: "Are there lots of cases or COVID-19 (community spread) where you live?" (See public health department website, if unsure)  * MAJOR community spread: high number of cases; numbers of cases are increasing; many people hospitalized.   * MINOR community spread: low number of cases; not increasing; few or no people hospitalized     Community spread 8. SYMPTOMS: "Do you have any symptoms?" (e.g., fever, cough, breathing difficulty)     No symptoms 9. PREGNANCY OR POSTPARTUM: "Is there any chance you are pregnant?" "When was your last menstrual period?" "Did you deliver in the last 2 weeks?"     n/a 10. HIGH RISK: "Do you have any heart or lung problems? Do you have a weak immune system?" (e.g., CHF, COPD, asthma, HIV positive, chemotherapy, renal failure, diabetes mellitus, sickle cell anemia)       no  Protocols used: CORONAVIRUS (COVID-19) EXPOSURE-A-AH

## 2019-03-04 NOTE — Telephone Encounter (Signed)
FYI

## 2019-04-05 DIAGNOSIS — Z6841 Body Mass Index (BMI) 40.0 and over, adult: Secondary | ICD-10-CM | POA: Diagnosis not present

## 2019-04-05 DIAGNOSIS — Z1231 Encounter for screening mammogram for malignant neoplasm of breast: Secondary | ICD-10-CM | POA: Diagnosis not present

## 2019-04-05 DIAGNOSIS — Z01419 Encounter for gynecological examination (general) (routine) without abnormal findings: Secondary | ICD-10-CM | POA: Diagnosis not present

## 2019-04-05 DIAGNOSIS — N959 Unspecified menopausal and perimenopausal disorder: Secondary | ICD-10-CM | POA: Diagnosis not present

## 2019-04-06 ENCOUNTER — Ambulatory Visit (INDEPENDENT_AMBULATORY_CARE_PROVIDER_SITE_OTHER): Payer: Medicare Other | Admitting: Internal Medicine

## 2019-04-06 ENCOUNTER — Other Ambulatory Visit (INDEPENDENT_AMBULATORY_CARE_PROVIDER_SITE_OTHER): Payer: Medicare Other

## 2019-04-06 ENCOUNTER — Other Ambulatory Visit: Payer: Self-pay

## 2019-04-06 ENCOUNTER — Encounter: Payer: Self-pay | Admitting: Internal Medicine

## 2019-04-06 DIAGNOSIS — K219 Gastro-esophageal reflux disease without esophagitis: Secondary | ICD-10-CM | POA: Diagnosis not present

## 2019-04-06 DIAGNOSIS — I10 Essential (primary) hypertension: Secondary | ICD-10-CM | POA: Diagnosis not present

## 2019-04-06 DIAGNOSIS — N183 Chronic kidney disease, stage 3 unspecified: Secondary | ICD-10-CM

## 2019-04-06 DIAGNOSIS — F411 Generalized anxiety disorder: Secondary | ICD-10-CM

## 2019-04-06 LAB — BASIC METABOLIC PANEL
BUN: 18 mg/dL (ref 6–23)
CO2: 28 mEq/L (ref 19–32)
Calcium: 9.6 mg/dL (ref 8.4–10.5)
Chloride: 101 mEq/L (ref 96–112)
Creatinine, Ser: 1.36 mg/dL — ABNORMAL HIGH (ref 0.40–1.20)
GFR: 46.47 mL/min — ABNORMAL LOW (ref >60.00)
Glucose, Bld: 99 mg/dL (ref 70–99)
Potassium: 4.3 mEq/L (ref 3.5–5.1)
Sodium: 138 mEq/L (ref 135–145)

## 2019-04-06 MED ORDER — DOXAZOSIN MESYLATE 8 MG PO TABS: 8.0000 mg | ORAL_TABLET | Freq: Every day | ORAL | 3 refills | Status: DC

## 2019-04-06 MED ORDER — LOSARTAN POTASSIUM 100 MG PO TABS: 100.0000 mg | ORAL_TABLET | Freq: Every day | ORAL | 3 refills | Status: DC

## 2019-04-06 MED ORDER — CARVEDILOL 25 MG PO TABS: ORAL_TABLET | ORAL | 3 refills | Status: DC

## 2019-04-06 MED ORDER — ESCITALOPRAM OXALATE 5 MG PO TABS: 5.0000 mg | ORAL_TABLET | Freq: Every day | ORAL | 3 refills | Status: DC

## 2019-04-06 NOTE — Progress Notes (Signed)
Subjective:  Patient ID: Jennifer Gibson, female    DOB: 10-12-1951  Age: 68 y.o. MRN: [id number]  CC: No chief complaint on file.   HPI GLADY OUDERKIRK presents for HTN, GERD, obesity f/u  Outpatient Medications Prior to Visit  Medication Sig Dispense Refill  . ALDACTAZIDE 50-50 MG tablet TAKE 1 TABLET DAILY 90 tablet 3  . carvedilol (COREG) 25 MG tablet TAKE 1 TABLET TWICE A DAY WITH MEALS (NEED OFFICE VISIT BEFORE REFILLS WILL BE GIVEN) 180 tablet 3  . Cetirizine HCl (ZYRTEC ALLERGY) 10 MG CAPS Take by mouth.    . Cholecalciferol (EQL VITAMIN D3) 1000 UNITS tablet Take 1,000 Units by mouth daily.      Marland Kitchen doxazosin (CARDURA) 8 MG tablet Take 1 tablet (8 mg total) by mouth daily. 90 tablet 0  . escitalopram (LEXAPRO) 5 MG tablet Take 1 tablet (5 mg total) by mouth daily. Patient needs office visit before refills will be given 90 tablet 3  . losartan (COZAAR) 100 MG tablet Take 1 tablet (100 mg total) by mouth daily. 90 tablet 3  . Multiple Vitamin (MULTIVITAMIN) capsule Take 1 capsule by mouth daily.    . Omega-3 Fatty Acids (OMEGA 3 PO) Take 1 capsule by mouth daily. Mega red    . pantoprazole (PROTONIX) 40 MG tablet Take 1 tablet (40 mg total) by mouth daily. 90 tablet 3   Facility-Administered Medications Prior to Visit  Medication Dose Route Frequency Provider Last Rate Last Dose  . pneumococcal 23 valent vaccine (PNU-IMMUNE) injection 0.5 mL  0.5 mL Intramuscular Tomorrow-1000 Norinne Jeane, Evie Lacks, MD        ROS: Review of Systems  Constitutional: Positive for unexpected weight change. Negative for activity change, appetite change, chills and fatigue.  HENT: Negative for congestion, mouth sores and sinus pressure.   Eyes: Negative for visual disturbance.  Respiratory: Negative for cough and chest tightness.   Gastrointestinal: Negative for abdominal pain and nausea.  Genitourinary: Negative for difficulty urinating, frequency and vaginal pain.  Musculoskeletal: Positive for  arthralgias. Negative for back pain and gait problem.  Skin: Negative for pallor and rash.  Neurological: Negative for dizziness, tremors, weakness, numbness and headaches.  Psychiatric/Behavioral: Negative for confusion and sleep disturbance.    Objective:  There were no vitals taken for this visit.  BP Readings from Last 3 Encounters:  12/31/18 124/72  06/29/18 128/76  12/29/17 126/72    Wt Readings from Last 3 Encounters:  12/31/18 274 lb (124.3 kg)  06/29/18 272 lb (123.4 kg)  12/29/17 267 lb (121.1 kg)    Physical Exam Constitutional:      General: She is not in acute distress.    Appearance: She is well-developed.  HENT:     Head: Normocephalic.     Right Ear: External ear normal.     Left Ear: External ear normal.     Nose: Nose normal.  Eyes:     General:        Right eye: No discharge.        Left eye: No discharge.     Conjunctiva/sclera: Conjunctivae normal.     Pupils: Pupils are equal, round, and reactive to light.  Neck:     Musculoskeletal: Normal range of motion and neck supple.     Thyroid: No thyromegaly.     Vascular: No JVD.     Trachea: No tracheal deviation.  Cardiovascular:     Rate and Rhythm: Normal rate and regular rhythm.  Heart sounds: Normal heart sounds.  Pulmonary:     Effort: No respiratory distress.     Breath sounds: No stridor. No wheezing.  Abdominal:     General: Bowel sounds are normal. There is no distension.     Palpations: Abdomen is soft. There is no mass.     Tenderness: There is no abdominal tenderness. There is no guarding or rebound.  Musculoskeletal:        General: No tenderness.  Lymphadenopathy:     Cervical: No cervical adenopathy.  Skin:    Findings: No erythema or rash.  Neurological:     Cranial Nerves: No cranial nerve deficit.     Motor: No abnormal muscle tone.     Coordination: Coordination normal.     Deep Tendon Reflexes: Reflexes normal.  Psychiatric:        Behavior: Behavior normal.         Thought Content: Thought content normal.        Judgment: Judgment normal.   obese  Lab Results  Component Value Date   WBC 6.0 12/31/2018   HGB 10.4 (L) 12/31/2018   HCT 32.2 (L) 12/31/2018   PLT 319.0 12/31/2018   GLUCOSE 79 12/31/2018   CHOL 165 01/09/2018   TRIG 73.0 01/09/2018   HDL 45.40 01/09/2018   LDLCALC 105 (H) 01/09/2018   ALT 10 01/09/2018   AST 12 01/09/2018   NA 138 12/31/2018   K 4.2 12/31/2018   CL 101 12/31/2018   CREATININE 1.15 12/31/2018   BUN 17 12/31/2018   CO2 30 12/31/2018   TSH 1.61 01/09/2018   HGBA1C 6.0 03/11/2014    US Renal  Result Date: 01/09/2019 CLINICAL DATA:  Chronic renal insufficiency. EXAM: RENAL / URINARY TRACT ULTRASOUND COMPLETE COMPARISON:  None. FINDINGS: Right Kidney: Renal measurements: 13.2 x 4.0 x 4.5 cm = volume: 126 mL. Contains a 5.8 cm lower pole cyst. Left Kidney: Renal measurements: 11.3 x 5.5 x 5.2 cm = volume: 170 mL. Contains a 1.7 cm midpole cyst. Bladder: Appears normal for degree of bladder distention. IMPRESSION: Bilateral renal cysts.  No other abnormalities. Electronically Signed   By: Dorise Bullion III M.D   On: 01/09/2019 20:58    Assessment & Plan:   There are no diagnoses linked to this encounter.   No orders of the defined types were placed in this encounter.    Follow-up: No follow-ups on file.  Walker Kehr, MD

## 2019-04-06 NOTE — Assessment & Plan Note (Signed)
Labs

## 2019-04-06 NOTE — Assessment & Plan Note (Signed)
Protonix.  ?

## 2019-04-06 NOTE — Assessment & Plan Note (Signed)
Coreg, Spironolactone, Doxazosin, Losartan A cardiac CT scan for calcium scoring offered 2/20

## 2019-04-06 NOTE — Assessment & Plan Note (Signed)
Lexapro 

## 2019-04-07 ENCOUNTER — Other Ambulatory Visit: Payer: Self-pay | Admitting: Internal Medicine

## 2019-04-07 MED ORDER — LOSARTAN POTASSIUM 100 MG PO TABS: 50.0000 mg | ORAL_TABLET | Freq: Every day | ORAL | 3 refills | Status: DC

## 2019-04-09 ENCOUNTER — Telehealth: Payer: Self-pay

## 2019-04-09 NOTE — Telephone Encounter (Signed)
Losartan 100mg  is unavailable, so is 50mg . Please advise about alternative.

## 2019-04-12 MED ORDER — LOSARTAN POTASSIUM 25 MG PO TABS: 25.0000 mg | ORAL_TABLET | Freq: Two times a day (BID) | ORAL | 3 refills | Status: DC

## 2019-04-12 NOTE — Telephone Encounter (Signed)
I change the prescription to 25 mg twice a day.  Prescription emailed Thanks

## 2019-04-13 NOTE — Telephone Encounter (Signed)
Pt informed of below.  

## 2019-05-24 DIAGNOSIS — Z20828 Contact with and (suspected) exposure to other viral communicable diseases: Secondary | ICD-10-CM | POA: Diagnosis not present

## 2019-07-01 ENCOUNTER — Ambulatory Visit (INDEPENDENT_AMBULATORY_CARE_PROVIDER_SITE_OTHER): Payer: Medicare Other | Admitting: Internal Medicine

## 2019-07-01 ENCOUNTER — Encounter: Payer: Self-pay | Admitting: Internal Medicine

## 2019-07-01 ENCOUNTER — Other Ambulatory Visit: Payer: Self-pay

## 2019-07-01 ENCOUNTER — Other Ambulatory Visit (INDEPENDENT_AMBULATORY_CARE_PROVIDER_SITE_OTHER): Payer: Medicare Other

## 2019-07-01 VITALS — BP 142/86 | HR 78 | Temp 98.4°F | Ht 65.0 in | Wt 283.0 lb

## 2019-07-01 DIAGNOSIS — N183 Chronic kidney disease, stage 3 unspecified: Secondary | ICD-10-CM

## 2019-07-01 DIAGNOSIS — Z6841 Body Mass Index (BMI) 40.0 and over, adult: Secondary | ICD-10-CM

## 2019-07-01 DIAGNOSIS — Z23 Encounter for immunization: Secondary | ICD-10-CM

## 2019-07-01 DIAGNOSIS — I129 Hypertensive chronic kidney disease with stage 1 through stage 4 chronic kidney disease, or unspecified chronic kidney disease: Secondary | ICD-10-CM | POA: Diagnosis not present

## 2019-07-01 DIAGNOSIS — I10 Essential (primary) hypertension: Secondary | ICD-10-CM

## 2019-07-01 LAB — BASIC METABOLIC PANEL
BUN: 19 mg/dL (ref 6–23)
CO2: 29 mEq/L (ref 19–32)
Calcium: 9.8 mg/dL (ref 8.4–10.5)
Chloride: 101 mEq/L (ref 96–112)
Creatinine, Ser: 1.35 mg/dL — ABNORMAL HIGH (ref 0.40–1.20)
GFR: 46.83 mL/min — ABNORMAL LOW (ref >60.00)
Glucose, Bld: 78 mg/dL (ref 70–99)
Potassium: 4.4 mEq/L (ref 3.5–5.1)
Sodium: 137 mEq/L (ref 135–145)

## 2019-07-01 NOTE — Assessment & Plan Note (Signed)
Coreg, Spironolactone, Doxazosin, Losartan

## 2019-07-01 NOTE — Patient Instructions (Signed)

## 2019-07-01 NOTE — Progress Notes (Signed)
Subjective:  Patient ID: Jennifer Gibson, female    DOB: 1951/08/21  Age: 68 y.o. MRN: [id number]  CC: No chief complaint on file.   HPI Jennifer Gibson presents for HTN, CRF, obesity   Outpatient Medications Prior to Visit  Medication Sig Dispense Refill  . ALDACTAZIDE 50-50 MG tablet TAKE 1 TABLET DAILY 90 tablet 3  . carvedilol (COREG) 25 MG tablet TAKE 1 TABLET TWICE A DAY WITH MEALS 180 tablet 3  . Cetirizine HCl (ZYRTEC ALLERGY) 10 MG CAPS Take by mouth.    . Cholecalciferol (EQL VITAMIN D3) 1000 UNITS tablet Take 1,000 Units by mouth daily.      Marland Kitchen doxazosin (CARDURA) 8 MG tablet Take 1 tablet (8 mg total) by mouth daily. 90 tablet 3  . escitalopram (LEXAPRO) 5 MG tablet Take 1 tablet (5 mg total) by mouth daily. Patient needs office visit before refills will be given 90 tablet 3  . losartan (COZAAR) 25 MG tablet Take 1 tablet (25 mg total) by mouth 2 (two) times a day. 180 tablet 3  . Multiple Vitamin (MULTIVITAMIN) capsule Take 1 capsule by mouth daily.    . Omega-3 Fatty Acids (OMEGA 3 PO) Take 1 capsule by mouth daily. Mega red    . pantoprazole (PROTONIX) 40 MG tablet Take 1 tablet (40 mg total) by mouth daily. 90 tablet 3   Facility-Administered Medications Prior to Visit  Medication Dose Route Frequency Provider Last Rate Last Dose  . pneumococcal 23 valent vaccine (PNU-IMMUNE) injection 0.5 mL  0.5 mL Intramuscular Tomorrow-1000 Plotnikov, Aleksei V, MD        ROS: Review of Systems  Constitutional: Negative for activity change, appetite change, chills, fatigue and unexpected weight change.  HENT: Negative for congestion, mouth sores and sinus pressure.   Eyes: Negative for visual disturbance.  Respiratory: Negative for cough and chest tightness.   Gastrointestinal: Negative for abdominal pain and nausea.  Genitourinary: Negative for difficulty urinating, frequency and vaginal pain.  Musculoskeletal: Negative for back pain and gait problem.  Skin: Negative for pallor  and rash.  Neurological: Negative for dizziness, tremors, weakness, numbness and headaches.  Psychiatric/Behavioral: Negative for confusion, sleep disturbance and suicidal ideas. The patient is not nervous/anxious.     Objective:  BP (!) 142/86   Pulse 78   Temp 98.4 F (36.9 C) (Oral)   Ht 5\' 5"  (1.651 m)   Wt 283 lb (128.4 kg)   SpO2 95%   BMI 47.09 kg/m   BP Readings from Last 3 Encounters:  07/01/19 (!) 142/86  04/06/19 126/78  12/31/18 124/72    Wt Readings from Last 3 Encounters:  07/01/19 283 lb (128.4 kg)  04/06/19 283 lb (128.4 kg)  12/31/18 274 lb (124.3 kg)    Physical Exam Constitutional:      General: She is not in acute distress.    Appearance: She is well-developed.  HENT:     Head: Normocephalic.     Right Ear: External ear normal.     Left Ear: External ear normal.     Nose: Nose normal.  Eyes:     General:        Right eye: No discharge.        Left eye: No discharge.     Conjunctiva/sclera: Conjunctivae normal.     Pupils: Pupils are equal, round, and reactive to light.  Neck:     Musculoskeletal: Normal range of motion and neck supple.     Thyroid: No thyromegaly.  Vascular: No JVD.     Trachea: No tracheal deviation.  Cardiovascular:     Rate and Rhythm: Normal rate and regular rhythm.     Heart sounds: Normal heart sounds.  Pulmonary:     Effort: No respiratory distress.     Breath sounds: No stridor. No wheezing.  Abdominal:     General: Bowel sounds are normal. There is no distension.     Palpations: Abdomen is soft. There is no mass.     Tenderness: There is no abdominal tenderness. There is no guarding or rebound.  Musculoskeletal:        General: No tenderness.  Lymphadenopathy:     Cervical: No cervical adenopathy.  Skin:    Findings: No erythema or rash.  Neurological:     Cranial Nerves: No cranial nerve deficit.     Motor: No abnormal muscle tone.     Coordination: Coordination normal.     Deep Tendon Reflexes:  Reflexes normal.  Psychiatric:        Behavior: Behavior normal.        Thought Content: Thought content normal.        Judgment: Judgment normal.     Lab Results  Component Value Date   WBC 6.0 12/31/2018   HGB 10.4 (L) 12/31/2018   HCT 32.2 (L) 12/31/2018   PLT 319.0 12/31/2018   GLUCOSE 99 04/06/2019   CHOL 165 01/09/2018   TRIG 73.0 01/09/2018   HDL 45.40 01/09/2018   LDLCALC 105 (H) 01/09/2018   ALT 10 01/09/2018   AST 12 01/09/2018   NA 138 04/06/2019   K 4.3 04/06/2019   CL 101 04/06/2019   CREATININE 1.36 (H) 04/06/2019   BUN 18 04/06/2019   CO2 28 04/06/2019   TSH 1.61 01/09/2018   HGBA1C 6.0 03/11/2014    US Renal  Result Date: 01/09/2019 CLINICAL DATA:  Chronic renal insufficiency. EXAM: RENAL / URINARY TRACT ULTRASOUND COMPLETE COMPARISON:  None. FINDINGS: Right Kidney: Renal measurements: 13.2 x 4.0 x 4.5 cm = volume: 126 mL. Contains a 5.8 cm lower pole cyst. Left Kidney: Renal measurements: 11.3 x 5.5 x 5.2 cm = volume: 170 mL. Contains a 1.7 cm midpole cyst. Bladder: Appears normal for degree of bladder distention. IMPRESSION: Bilateral renal cysts.  No other abnormalities. Electronically Signed   By: Dorise Bullion III M.D   On: 01/09/2019 20:58    Assessment & Plan:   Diagnoses and all orders for this visit:  Need for influenza vaccination -     Flu Vaccine QUAD High Dose(Fluad)     No orders of the defined types were placed in this encounter.    Follow-up: No follow-ups on file.  Walker Kehr, MD

## 2019-07-01 NOTE — Assessment & Plan Note (Signed)
Diet discussed 

## 2019-07-01 NOTE — Assessment & Plan Note (Signed)
BMET 

## 2019-07-05 ENCOUNTER — Encounter: Payer: Self-pay | Admitting: Internal Medicine

## 2019-11-23 MED ORDER — PANTOPRAZOLE SODIUM 40 MG PO TBEC: 40.0000 mg | DELAYED_RELEASE_TABLET | Freq: Every day | ORAL | 0 refills | Status: DC

## 2019-11-29 ENCOUNTER — Encounter: Payer: Self-pay | Admitting: Internal Medicine

## 2019-11-29 ENCOUNTER — Other Ambulatory Visit: Payer: Self-pay

## 2019-11-29 ENCOUNTER — Ambulatory Visit (INDEPENDENT_AMBULATORY_CARE_PROVIDER_SITE_OTHER): Payer: Medicare Other | Admitting: Internal Medicine

## 2019-11-29 DIAGNOSIS — K219 Gastro-esophageal reflux disease without esophagitis: Secondary | ICD-10-CM | POA: Diagnosis not present

## 2019-11-29 DIAGNOSIS — N183 Chronic kidney disease, stage 3 unspecified: Secondary | ICD-10-CM

## 2019-11-29 DIAGNOSIS — F411 Generalized anxiety disorder: Secondary | ICD-10-CM

## 2019-11-29 DIAGNOSIS — I10 Essential (primary) hypertension: Secondary | ICD-10-CM | POA: Diagnosis not present

## 2019-11-29 MED ORDER — PANTOPRAZOLE SODIUM 40 MG PO TBEC: 40.0000 mg | DELAYED_RELEASE_TABLET | Freq: Every day | ORAL | 3 refills | Status: DC

## 2019-11-29 NOTE — Progress Notes (Signed)
Subjective:  Patient ID: Jennifer Gibson, female    DOB: [birth year]  Age: 69 y.o. MRN: [id number]  CC: No chief complaint on file.   HPI Jennifer Gibson presents for HTN, obesity, CRF f/u C/o GERD  Outpatient Medications Prior to Visit  Medication Sig Dispense Refill  . ALDACTAZIDE 50-50 MG tablet TAKE 1 TABLET DAILY 90 tablet 3  . carvedilol (COREG) 25 MG tablet TAKE 1 TABLET TWICE A DAY WITH MEALS 180 tablet 3  . Cetirizine HCl (ZYRTEC ALLERGY) 10 MG CAPS Take by mouth.    . Cholecalciferol (EQL VITAMIN D3) 1000 UNITS tablet Take 1,000 Units by mouth daily.      Marland Kitchen doxazosin (CARDURA) 8 MG tablet Take 1 tablet (8 mg total) by mouth daily. 90 tablet 3  . escitalopram (LEXAPRO) 5 MG tablet Take 1 tablet (5 mg total) by mouth daily. Patient needs office visit before refills will be given 90 tablet 3  . losartan (COZAAR) 25 MG tablet Take 1 tablet (25 mg total) by mouth 2 (two) times a day. 180 tablet 3  . Multiple Vitamin (MULTIVITAMIN) capsule Take 1 capsule by mouth daily.    . Omega-3 Fatty Acids (OMEGA 3 PO) Take 1 capsule by mouth daily. Mega red    . pantoprazole (PROTONIX) 40 MG tablet Take 1 tablet (40 mg total) by mouth daily. Office visit needed before refills will be given 90 tablet 0   Facility-Administered Medications Prior to Visit  Medication Dose Route Frequency Provider Last Rate Last Admin  . pneumococcal 23 valent vaccine (PNU-IMMUNE) injection 0.5 mL  0.5 mL Intramuscular Tomorrow-1000 Cid Agena V, MD        ROS: Review of Systems  Constitutional: Negative for activity change, appetite change, chills, fatigue and unexpected weight change.  HENT: Negative for congestion, mouth sores and sinus pressure.   Eyes: Negative for visual disturbance.  Respiratory: Negative for cough and chest tightness.   Gastrointestinal: Negative for abdominal pain and nausea.  Genitourinary: Negative for difficulty urinating, frequency and vaginal pain.  Musculoskeletal:  Negative for back pain and gait problem.  Skin: Negative for pallor and rash.  Neurological: Negative for dizziness, tremors, weakness, numbness and headaches.  Psychiatric/Behavioral: Negative for confusion and sleep disturbance.    Objective:  BP 134/78 (BP Location: Right Arm, Patient Position: Sitting, Cuff Size: Large)   Pulse 72   Temp 98.5 F (36.9 C) (Oral)   Ht 5\' 5"  (1.651 m)   Wt 280 lb (127 kg)   BMI 46.59 kg/m   BP Readings from Last 3 Encounters:  11/29/19 134/78  07/01/19 (!) 142/86  04/06/19 126/78    Wt Readings from Last 3 Encounters:  11/29/19 280 lb (127 kg)  07/01/19 283 lb (128.4 kg)  04/06/19 283 lb (128.4 kg)    Physical Exam Constitutional:      General: She is not in acute distress.    Appearance: She is well-developed. She is obese.  HENT:     Head: Normocephalic.     Right Ear: External ear normal.     Left Ear: External ear normal.     Nose: Nose normal.  Eyes:     General:        Right eye: No discharge.        Left eye: No discharge.     Conjunctiva/sclera: Conjunctivae normal.     Pupils: Pupils are equal, round, and reactive to light.  Neck:     Thyroid: No thyromegaly.  Vascular: No JVD.     Trachea: No tracheal deviation.  Cardiovascular:     Rate and Rhythm: Normal rate and regular rhythm.     Heart sounds: Normal heart sounds.  Pulmonary:     Effort: No respiratory distress.     Breath sounds: No stridor. No wheezing.  Abdominal:     General: Bowel sounds are normal. There is no distension.     Palpations: Abdomen is soft. There is no mass.     Tenderness: There is no abdominal tenderness. There is no guarding or rebound.  Musculoskeletal:        General: No tenderness.     Cervical back: Normal range of motion and neck supple.  Lymphadenopathy:     Cervical: No cervical adenopathy.  Skin:    Findings: No erythema or rash.  Neurological:     Mental Status: She is oriented to person, place, and time.     Cranial  Nerves: No cranial nerve deficit.     Motor: No abnormal muscle tone.     Coordination: Coordination normal.     Deep Tendon Reflexes: Reflexes normal.  Psychiatric:        Behavior: Behavior normal.        Thought Content: Thought content normal.        Judgment: Judgment normal.     Lab Results  Component Value Date   WBC 6.0 12/31/2018   HGB 10.4 (L) 12/31/2018   HCT 32.2 (L) 12/31/2018   PLT 319.0 12/31/2018   GLUCOSE 78 07/01/2019   CHOL 165 01/09/2018   TRIG 73.0 01/09/2018   HDL 45.40 01/09/2018   LDLCALC 105 (H) 01/09/2018   ALT 10 01/09/2018   AST 12 01/09/2018   NA 137 07/01/2019   K 4.4 07/01/2019   CL 101 07/01/2019   CREATININE 1.35 (H) 07/01/2019   BUN 19 07/01/2019   CO2 29 07/01/2019   TSH 1.61 01/09/2018   HGBA1C 6.0 03/11/2014    US Renal  Result Date: 01/09/2019 CLINICAL DATA:  Chronic renal insufficiency. EXAM: RENAL / URINARY TRACT ULTRASOUND COMPLETE COMPARISON:  None. FINDINGS: Right Kidney: Renal measurements: 13.2 x 4.0 x 4.5 cm = volume: 126 mL. Contains a 5.8 cm lower pole cyst. Left Kidney: Renal measurements: 11.3 x 5.5 x 5.2 cm = volume: 170 mL. Contains a 1.7 cm midpole cyst. Bladder: Appears normal for degree of bladder distention. IMPRESSION: Bilateral renal cysts.  No other abnormalities. Electronically Signed   By: Dorise Bullion III M.D   On: 01/09/2019 20:58    Assessment & Plan:     Follow-up: No follow-ups on file.  Walker Kehr, MD

## 2019-11-29 NOTE — Assessment & Plan Note (Signed)
Lexapro 

## 2019-11-30 LAB — BASIC METABOLIC PANEL
BUN: 22 mg/dL (ref 6–23)
CO2: 26 mEq/L (ref 19–32)
Calcium: 9.5 mg/dL (ref 8.4–10.5)
Chloride: 100 mEq/L (ref 96–112)
Creatinine, Ser: 1.4 mg/dL — ABNORMAL HIGH (ref 0.40–1.20)
GFR: 44.85 mL/min — ABNORMAL LOW (ref >60.00)
Glucose, Bld: 85 mg/dL (ref 70–99)
Potassium: 4.2 mEq/L (ref 3.5–5.1)
Sodium: 137 mEq/L (ref 135–145)

## 2020-04-06 ENCOUNTER — Other Ambulatory Visit: Payer: Self-pay | Admitting: Internal Medicine

## 2020-04-27 IMAGING — US US RENAL
1 series · 14 of 25 positions shown · non-contrast
Comparison: None.

CLINICAL DATA: Chronic renal insufficiency.

EXAM:
RENAL / URINARY TRACT ULTRASOUND COMPLETE

[Series 1: us renal · 0.25mm/px · 14 of 35 slices shown]
[im 1/35]
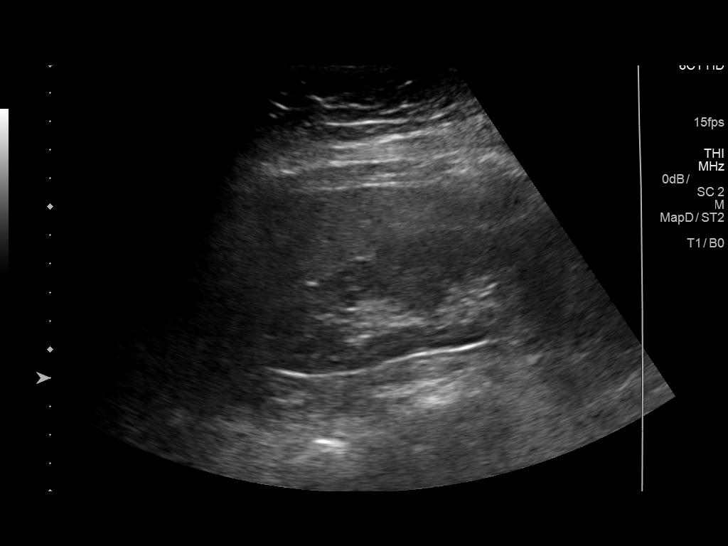
[im 3/35]
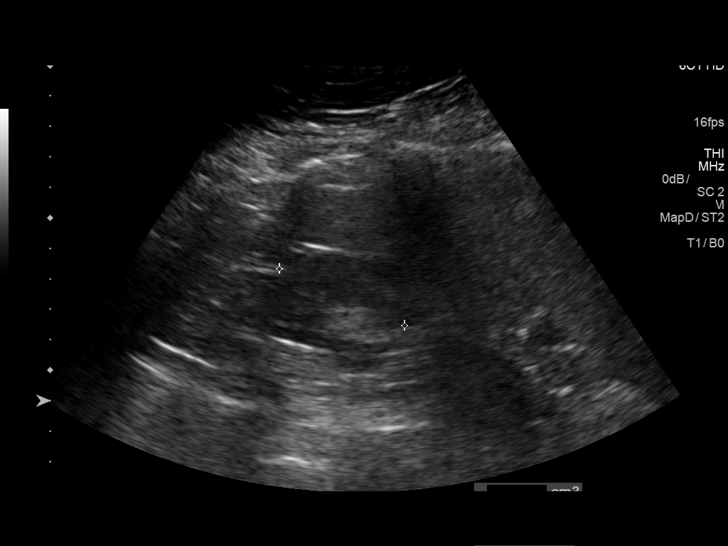
[im 6/35]
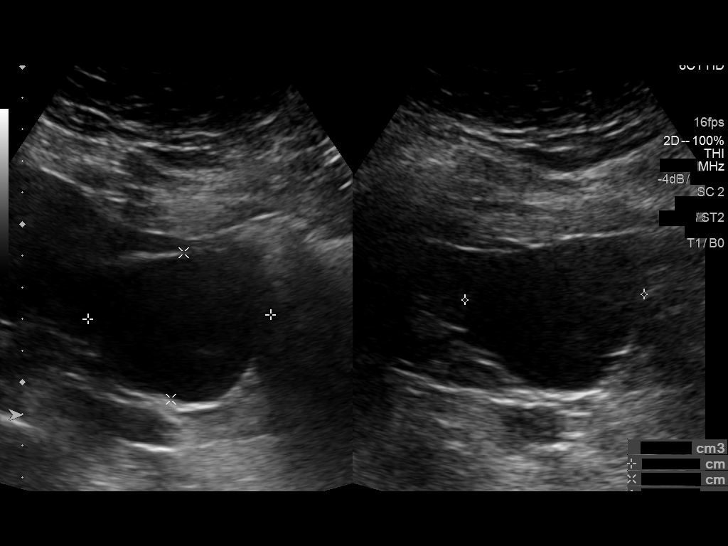
[im 9/35]
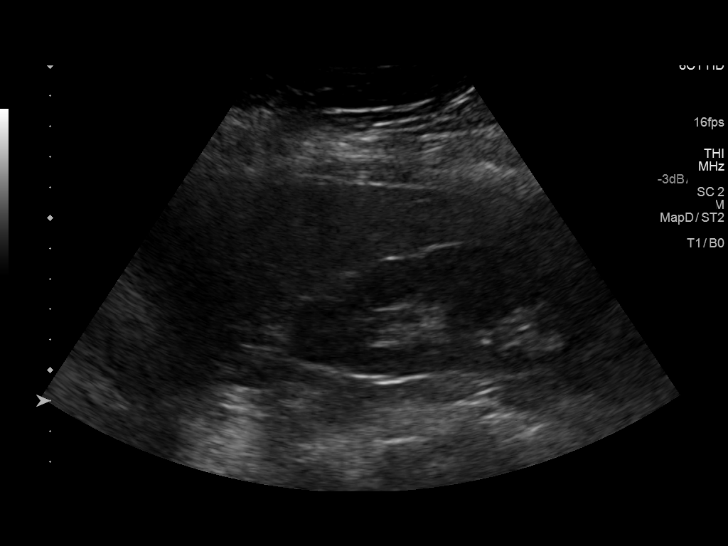
[im 12/35]
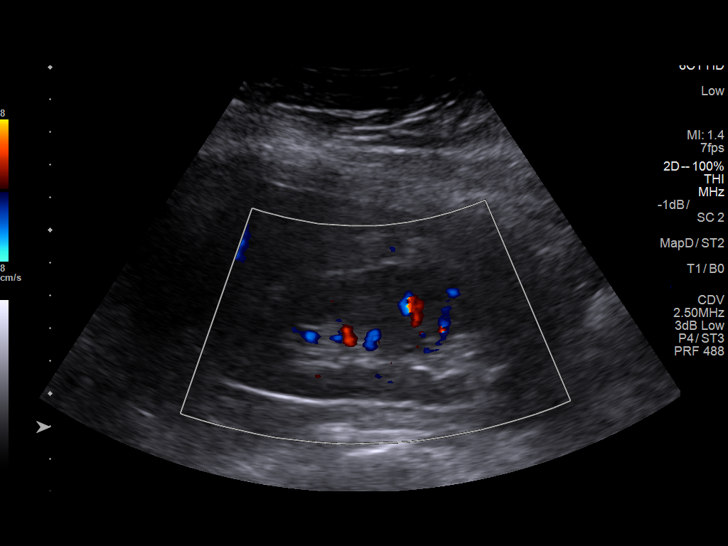
[im 13/35]
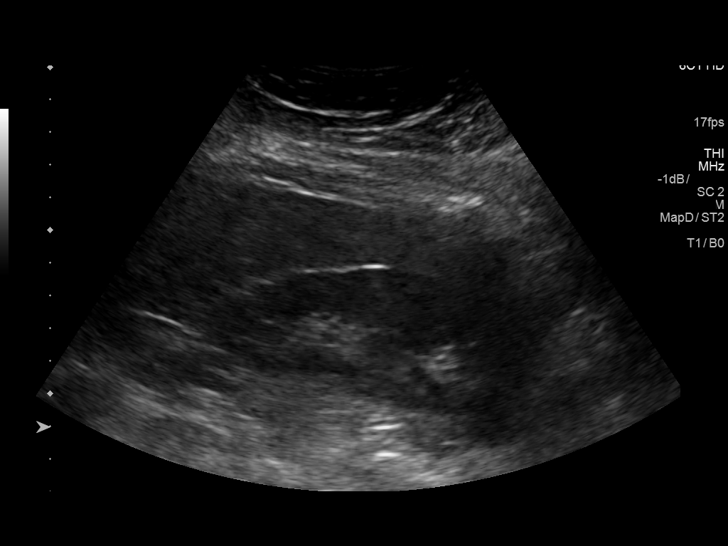
[im 16/35]
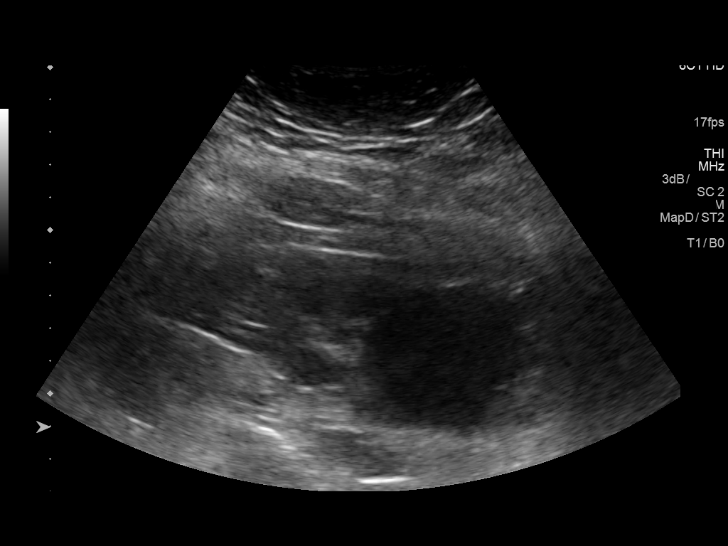
[im 19/35]
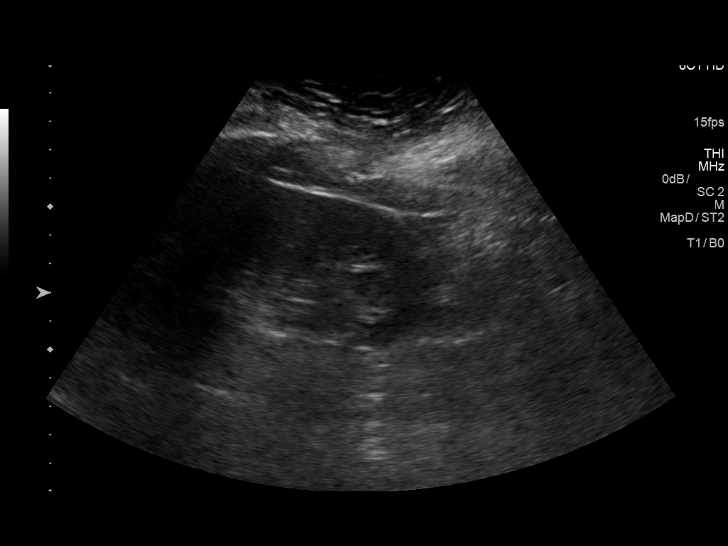
[im 22/35]
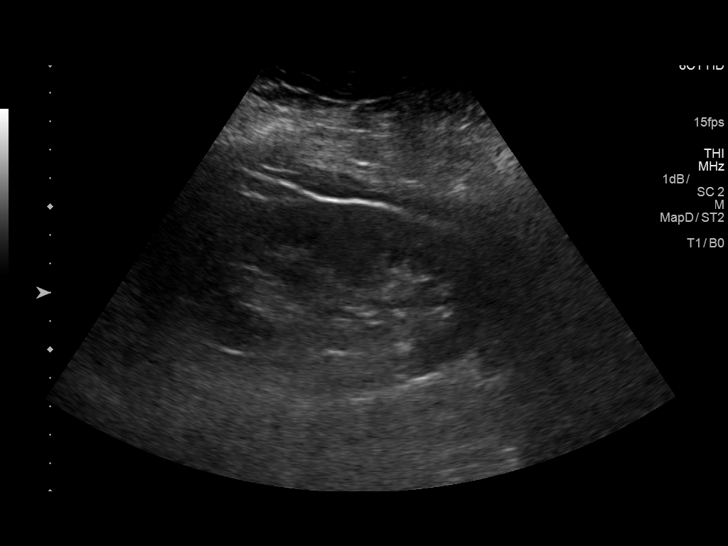
[im 23/35]
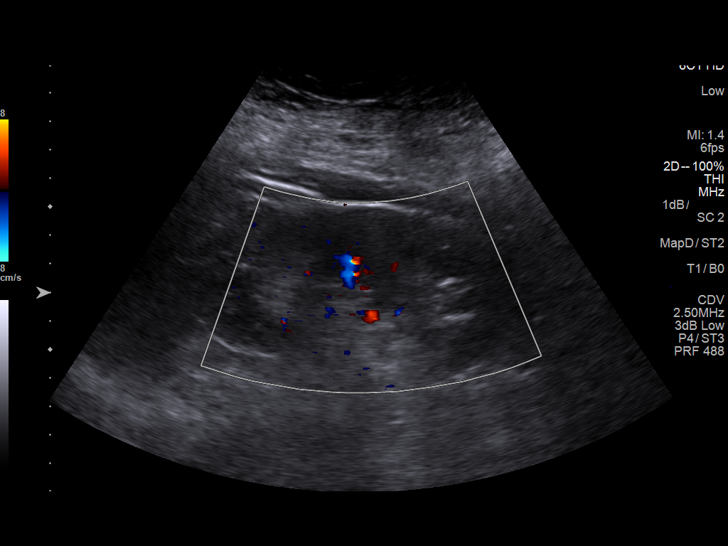
[im 26/35]
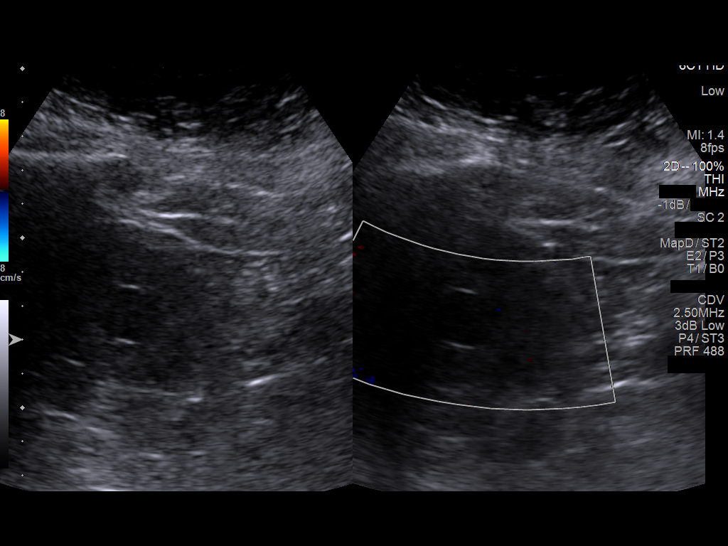
[im 29/35]
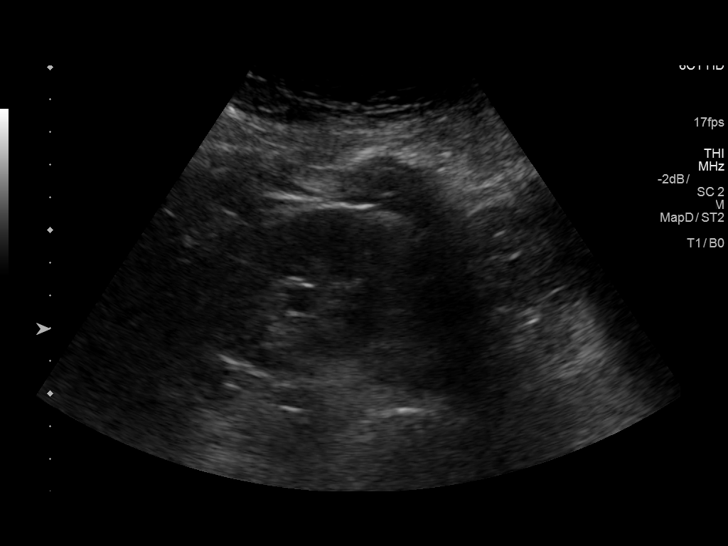
[im 32/35]
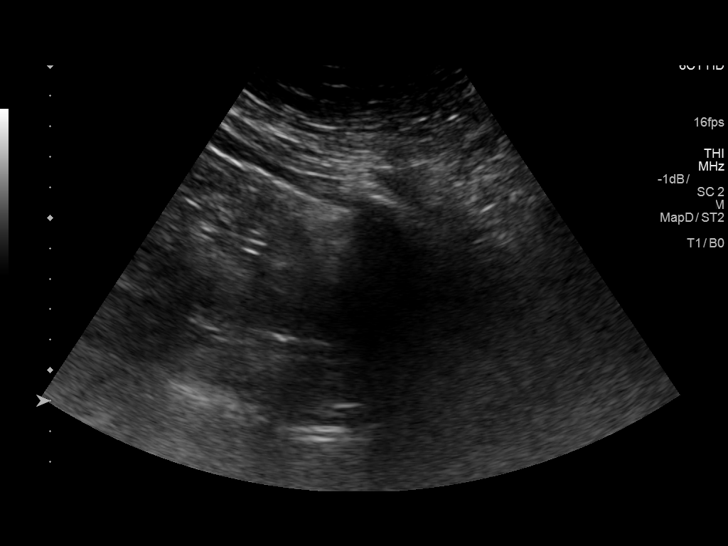
[im 35/35]
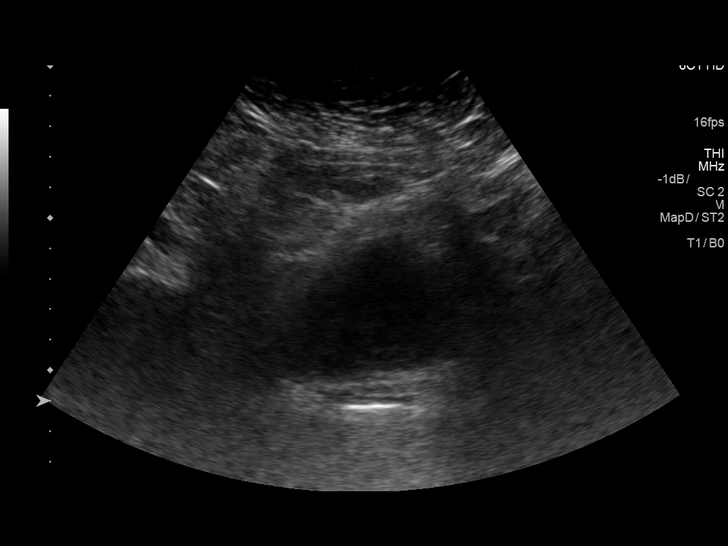

[14 of 25 positions shown; findings below may reference images not displayed]

FINDINGS: Right Kidney:

Renal measurements: 13.2 x 4.0 x 4.5 cm = volume: 126 mL. Contains a
5.8 cm lower pole cyst.

Left Kidney:

Renal measurements: 11.3 x 5.5 x 5.2 cm = volume: 170 mL. Contains a
1.7 cm midpole cyst.

Bladder:

Appears normal for degree of bladder distention.
IMPRESSION: Bilateral renal cysts.  No other abnormalities.

## 2020-05-29 ENCOUNTER — Other Ambulatory Visit: Payer: Self-pay

## 2020-05-29 ENCOUNTER — Encounter: Payer: Self-pay | Admitting: Internal Medicine

## 2020-05-29 ENCOUNTER — Other Ambulatory Visit: Payer: Self-pay | Admitting: Internal Medicine

## 2020-05-29 ENCOUNTER — Ambulatory Visit (INDEPENDENT_AMBULATORY_CARE_PROVIDER_SITE_OTHER): Payer: Medicare Other | Admitting: Internal Medicine

## 2020-05-29 VITALS — BP 136/82 | HR 87 | Temp 98.6°F | Ht 65.0 in | Wt 280.0 lb

## 2020-05-29 DIAGNOSIS — E785 Hyperlipidemia, unspecified: Secondary | ICD-10-CM | POA: Diagnosis not present

## 2020-05-29 DIAGNOSIS — N183 Chronic kidney disease, stage 3 unspecified: Secondary | ICD-10-CM

## 2020-05-29 DIAGNOSIS — I10 Essential (primary) hypertension: Secondary | ICD-10-CM | POA: Diagnosis not present

## 2020-05-29 DIAGNOSIS — Z6841 Body Mass Index (BMI) 40.0 and over, adult: Secondary | ICD-10-CM | POA: Diagnosis not present

## 2020-05-29 DIAGNOSIS — D649 Anemia, unspecified: Secondary | ICD-10-CM

## 2020-05-29 LAB — COMPLETE METABOLIC PANEL WITH GFR
AG Ratio: 1.2 (calc) (ref 1.0–2.5)
ALT: 13 U/L (ref 6–29)
AST: 17 U/L (ref 10–35)
Albumin: 4.1 g/dL (ref 3.6–5.1)
Alkaline phosphatase (APISO): 58 U/L (ref 37–153)
BUN/Creatinine Ratio: 14 (calc) (ref 6–22)
BUN: 21 mg/dL (ref 7–25)
CO2: 26 mmol/L (ref 20–32)
Calcium: 9.6 mg/dL (ref 8.6–10.4)
Chloride: 101 mmol/L (ref 98–110)
Creat: 1.46 mg/dL — ABNORMAL HIGH (ref 0.60–0.93)
GFR, Est African American: 42 mL/min/{1.73_m2} — ABNORMAL LOW (ref >=60)
GFR, Est Non African American: 36 mL/min/{1.73_m2} — ABNORMAL LOW (ref >=60)
Globulin: 3.4 g/dL (calc) (ref 1.9–3.7)
Glucose, Bld: 77 mg/dL (ref 65–99)
Potassium: 4.7 mmol/L (ref 3.5–5.3)
Sodium: 137 mmol/L (ref 135–146)
Total Bilirubin: 0.5 mg/dL (ref 0.2–1.2)
Total Protein: 7.5 g/dL (ref 6.1–8.1)

## 2020-05-29 NOTE — Addendum Note (Signed)
Addended by: Cresenciano Lick on: 05/29/2020 02:53 PM   Modules accepted: Orders

## 2020-05-29 NOTE — Assessment & Plan Note (Signed)
Wt Readings from Last 3 Encounters:  05/29/20 (!) 280 lb (127 kg)  11/29/19 280 lb (127 kg)  07/01/19 283 lb (128.4 kg)

## 2020-05-29 NOTE — Assessment & Plan Note (Signed)
Labs

## 2020-05-29 NOTE — Assessment & Plan Note (Signed)
Chronic Coreg, Spironolactone, Doxazosin, Losartan 

## 2020-05-29 NOTE — Progress Notes (Signed)
Subjective:  Patient ID: Jennifer Gibson, female    DOB: 30-Mar-1951  Age: 69 y.o. MRN: [id number]  CC: No chief complaint on file.   HPI TERRIN IMPARATO presents for HTN, obesity, CRI f/u  Outpatient Medications Prior to Visit  Medication Sig Dispense Refill  . carvedilol (COREG) 25 MG tablet TAKE 1 TABLET TWICE A DAY WITH MEALS 180 tablet 3  . Cetirizine HCl (ZYRTEC ALLERGY) 10 MG CAPS Take by mouth.    . Cholecalciferol (EQL VITAMIN D3) 1000 UNITS tablet Take 1,000 Units by mouth daily.      Marland Kitchen doxazosin (CARDURA) 8 MG tablet Take 1 tablet (8 mg total) by mouth daily. OFFICE VISIT IS DUE 90 tablet 0  . escitalopram (LEXAPRO) 5 MG tablet Take 1 tablet (5 mg total) by mouth daily. OFFICE VISIT IS DUE 90 tablet 0  . losartan (COZAAR) 25 MG tablet Take 1 tablet (25 mg total) by mouth 2 (two) times daily. OFFICE VISIT IS DUE 180 tablet 0  . Multiple Vitamin (MULTIVITAMIN) capsule Take 1 capsule by mouth daily.    . Omega-3 Fatty Acids (OMEGA 3 PO) Take 1 capsule by mouth daily. Mega red    . pantoprazole (PROTONIX) 40 MG tablet Take 1 tablet (40 mg total) by mouth daily. Office visit needed before refills will be given 90 tablet 3  . spironolactone-hydrochlorothiazide (ALDACTAZIDE) 50-50 MG tablet Take 1 tablet by mouth daily. OFFICE VISIT IS DUE 90 tablet 0   Facility-Administered Medications Prior to Visit  Medication Dose Route Frequency Provider Last Rate Last Admin  . pneumococcal 23 valent vaccine (PNU-IMMUNE) injection 0.5 mL  0.5 mL Intramuscular Tomorrow-1000 Divit Stipp, Evie Lacks, MD        ROS: Review of Systems  Constitutional: Positive for fatigue. Negative for activity change, appetite change, chills and unexpected weight change.  HENT: Negative for congestion, mouth sores and sinus pressure.   Eyes: Negative for visual disturbance.  Respiratory: Negative for cough and chest tightness.   Gastrointestinal: Negative for abdominal pain and nausea.  Genitourinary: Negative for  difficulty urinating, frequency and vaginal pain.  Musculoskeletal: Negative for back pain and gait problem.  Skin: Negative for pallor and rash.  Neurological: Negative for dizziness, tremors, weakness, numbness and headaches.  Psychiatric/Behavioral: Negative for confusion, sleep disturbance and suicidal ideas.    Objective:  BP (!) 136/82 (BP Location: Left Arm, Patient Position: Sitting, Cuff Size: Large)   Pulse 87   Temp 98.6 F (37 C) (Oral)   Ht 5\' 5"  (1.651 m)   Wt (!) 280 lb (127 kg)   SpO2 99%   BMI 46.59 kg/m   BP Readings from Last 3 Encounters:  05/29/20 (!) 136/82  11/29/19 134/78  07/01/19 (!) 142/86    Wt Readings from Last 3 Encounters:  05/29/20 (!) 280 lb (127 kg)  11/29/19 280 lb (127 kg)  07/01/19 283 lb (128.4 kg)    Physical Exam Constitutional:      General: She is not in acute distress.    Appearance: She is well-developed. She is obese.  HENT:     Head: Normocephalic.     Right Ear: External ear normal.     Left Ear: External ear normal.     Nose: Nose normal.  Eyes:     General:        Right eye: No discharge.        Left eye: No discharge.     Conjunctiva/sclera: Conjunctivae normal.     Pupils: Pupils  are equal, round, and reactive to light.  Neck:     Thyroid: No thyromegaly.     Vascular: No JVD.     Trachea: No tracheal deviation.  Cardiovascular:     Rate and Rhythm: Normal rate and regular rhythm.     Heart sounds: Normal heart sounds.  Pulmonary:     Effort: No respiratory distress.     Breath sounds: No stridor. No wheezing.  Abdominal:     General: Bowel sounds are normal. There is no distension.     Palpations: Abdomen is soft. There is no mass.     Tenderness: There is no abdominal tenderness. There is no guarding or rebound.  Musculoskeletal:        General: No tenderness.     Cervical back: Normal range of motion and neck supple.  Lymphadenopathy:     Cervical: No cervical adenopathy.  Skin:    Findings: No  erythema or rash.  Neurological:     Mental Status: She is oriented to person, place, and time.     Cranial Nerves: No cranial nerve deficit.     Motor: No abnormal muscle tone.     Coordination: Coordination normal.     Deep Tendon Reflexes: Reflexes normal.  Psychiatric:        Behavior: Behavior normal.        Thought Content: Thought content normal.        Judgment: Judgment normal.     Lab Results  Component Value Date   WBC 6.0 12/31/2018   HGB 10.4 (L) 12/31/2018   HCT 32.2 (L) 12/31/2018   PLT 319.0 12/31/2018   GLUCOSE 85 11/29/2019   CHOL 165 01/09/2018   TRIG 73.0 01/09/2018   HDL 45.40 01/09/2018   LDLCALC 105 (H) 01/09/2018   ALT 10 01/09/2018   AST 12 01/09/2018   NA 137 11/29/2019   K 4.2 11/29/2019   CL 100 11/29/2019   CREATININE 1.40 (H) 11/29/2019   BUN 22 11/29/2019   CO2 26 11/29/2019   TSH 1.61 01/09/2018   HGBA1C 6.0 03/11/2014    US Renal  Result Date: 01/09/2019 CLINICAL DATA:  Chronic renal insufficiency. EXAM: RENAL / URINARY TRACT ULTRASOUND COMPLETE COMPARISON:  None. FINDINGS: Right Kidney: Renal measurements: 13.2 x 4.0 x 4.5 cm = volume: 126 mL. Contains a 5.8 cm lower pole cyst. Left Kidney: Renal measurements: 11.3 x 5.5 x 5.2 cm = volume: 170 mL. Contains a 1.7 cm midpole cyst. Bladder: Appears normal for degree of bladder distention. IMPRESSION: Bilateral renal cysts.  No other abnormalities. Electronically Signed   By: Dorise Bullion III M.D   On: 01/09/2019 20:58    Assessment & Plan:   There are no diagnoses linked to this encounter.   No orders of the defined types were placed in this encounter.    Follow-up: No follow-ups on file.  Walker Kehr, MD

## 2020-05-30 LAB — LIPID PANEL
Cholesterol: 193 mg/dL (ref <200)
HDL: 51 mg/dL (ref >=50)
LDL Cholesterol (Calc): 122 mg/dL (calc) — ABNORMAL HIGH
Non-HDL Cholesterol (Calc): 142 mg/dL (calc) — ABNORMAL HIGH (ref <130)
Total CHOL/HDL Ratio: 3.8 (calc) (ref <5.0)
Triglycerides: 94 mg/dL (ref <150)

## 2020-05-30 LAB — CBC WITH DIFFERENTIAL/PLATELET
Absolute Monocytes: 660 cells/uL (ref 200–950)
Basophils Absolute: 18 cells/uL (ref 0–200)
Basophils Relative: 0.3 %
Eosinophils Absolute: 78 cells/uL (ref 15–500)
Eosinophils Relative: 1.3 %
HCT: 32.2 % — ABNORMAL LOW (ref 35.0–45.0)
Hemoglobin: 10.4 g/dL — ABNORMAL LOW (ref 11.7–15.5)
Lymphs Abs: 1686 cells/uL (ref 850–3900)
MCH: 26.2 pg — ABNORMAL LOW (ref 27.0–33.0)
MCHC: 32.3 g/dL (ref 32.0–36.0)
MCV: 81.1 fL (ref 80.0–100.0)
MPV: 11.2 fL (ref 7.5–12.5)
Monocytes Relative: 11 %
Neutro Abs: 3558 cells/uL (ref 1500–7800)
Neutrophils Relative %: 59.3 %
Platelets: 288 10*3/uL (ref 140–400)
RBC: 3.97 10*6/uL (ref 3.80–5.10)
RDW: 13 % (ref 11.0–15.0)
Total Lymphocyte: 28.1 %
WBC: 6 10*3/uL (ref 3.8–10.8)

## 2020-05-30 LAB — URINALYSIS
Bilirubin Urine: NEGATIVE
Glucose, UA: NEGATIVE
Hgb urine dipstick: NEGATIVE
Ketones, ur: NEGATIVE
Leukocytes,Ua: NEGATIVE
Nitrite: NEGATIVE
Protein, ur: NEGATIVE
Specific Gravity, Urine: 1.013 (ref 1.001–1.03)
pH: 6.5 (ref 5.0–8.0)

## 2020-05-30 LAB — TSH: TSH: 1.47 mIU/L (ref 0.40–4.50)

## 2020-06-15 DIAGNOSIS — Z1231 Encounter for screening mammogram for malignant neoplasm of breast: Secondary | ICD-10-CM | POA: Diagnosis not present

## 2020-06-15 DIAGNOSIS — Z6841 Body Mass Index (BMI) 40.0 and over, adult: Secondary | ICD-10-CM | POA: Diagnosis not present

## 2020-06-15 DIAGNOSIS — Z01419 Encounter for gynecological examination (general) (routine) without abnormal findings: Secondary | ICD-10-CM | POA: Diagnosis not present

## 2020-07-28 ENCOUNTER — Other Ambulatory Visit: Payer: Self-pay | Admitting: Internal Medicine

## 2020-08-28 ENCOUNTER — Other Ambulatory Visit: Payer: Self-pay | Admitting: Internal Medicine

## 2020-09-04 ENCOUNTER — Encounter: Payer: Self-pay | Admitting: Internal Medicine

## 2020-10-18 DIAGNOSIS — Z23 Encounter for immunization: Secondary | ICD-10-CM | POA: Diagnosis not present

## 2020-10-23 ENCOUNTER — Encounter: Payer: Self-pay | Admitting: Internal Medicine

## 2020-11-18 DIAGNOSIS — Z1152 Encounter for screening for COVID-19: Secondary | ICD-10-CM | POA: Diagnosis not present

## 2020-11-30 ENCOUNTER — Ambulatory Visit (INDEPENDENT_AMBULATORY_CARE_PROVIDER_SITE_OTHER): Payer: Medicare Other | Admitting: Internal Medicine

## 2020-11-30 ENCOUNTER — Other Ambulatory Visit: Payer: Self-pay

## 2020-11-30 ENCOUNTER — Ambulatory Visit (INDEPENDENT_AMBULATORY_CARE_PROVIDER_SITE_OTHER): Payer: Medicare Other

## 2020-11-30 ENCOUNTER — Encounter: Payer: Self-pay | Admitting: Internal Medicine

## 2020-11-30 VITALS — BP 122/70 | HR 64 | Temp 98.2°F | Ht 65.0 in | Wt 278.2 lb

## 2020-11-30 DIAGNOSIS — F419 Anxiety disorder, unspecified: Secondary | ICD-10-CM

## 2020-11-30 DIAGNOSIS — Z0000 Encounter for general adult medical examination without abnormal findings: Secondary | ICD-10-CM | POA: Diagnosis not present

## 2020-11-30 DIAGNOSIS — E049 Nontoxic goiter, unspecified: Secondary | ICD-10-CM

## 2020-11-30 DIAGNOSIS — Z6841 Body Mass Index (BMI) 40.0 and over, adult: Secondary | ICD-10-CM

## 2020-11-30 DIAGNOSIS — I10 Essential (primary) hypertension: Secondary | ICD-10-CM | POA: Diagnosis not present

## 2020-11-30 LAB — COMPREHENSIVE METABOLIC PANEL
ALT: 11 U/L (ref 0–35)
AST: 14 U/L (ref 0–37)
Albumin: 4.1 g/dL (ref 3.5–5.2)
Alkaline Phosphatase: 60 U/L (ref 39–117)
BUN: 21 mg/dL (ref 6–23)
CO2: 32 mEq/L (ref 19–32)
Calcium: 10.3 mg/dL (ref 8.4–10.5)
Chloride: 101 mEq/L (ref 96–112)
Creatinine, Ser: 1.24 mg/dL — ABNORMAL HIGH (ref 0.40–1.20)
GFR: 43.64 mL/min — ABNORMAL LOW (ref >60.00)
Glucose, Bld: 81 mg/dL (ref 70–99)
Potassium: 4.2 mEq/L (ref 3.5–5.1)
Sodium: 137 mEq/L (ref 135–145)
Total Bilirubin: 0.5 mg/dL (ref 0.2–1.2)
Total Protein: 8 g/dL (ref 6.0–8.3)

## 2020-11-30 LAB — TSH: TSH: 1.23 u[IU]/mL (ref 0.35–4.50)

## 2020-11-30 MED ORDER — VITAMIN D3 50 MCG (2000 UT) PO CAPS: 2000.0000 [IU] | ORAL_CAPSULE | Freq: Every day | ORAL | 3 refills | Status: AC

## 2020-11-30 MED ORDER — PANTOPRAZOLE SODIUM 40 MG PO TBEC: 40.0000 mg | DELAYED_RELEASE_TABLET | Freq: Every day | ORAL | 3 refills | Status: DC

## 2020-11-30 NOTE — Progress Notes (Signed)
Subjective:  Patient ID: Jennifer Gibson, female    DOB: 1951-03-31  Age: 70 y.o. MRN: [id number]  CC: Follow-up (6 month f/u)   HPI Jennifer Gibson presents for HTN, anxiety, GERD f/u  Outpatient Medications Prior to Visit  Medication Sig Dispense Refill  . ALDACTAZIDE 50-50 MG tablet TAKE 1 TABLET DAILY (OFFICE VISIT IS DUE) 90 tablet 2  . carvedilol (COREG) 25 MG tablet TAKE 1 TABLET TWICE A DAY WITH MEALS 180 tablet 3  . Cetirizine HCl 10 MG CAPS Take by mouth.    . Cholecalciferol 25 MCG (1000 UT) tablet Take 1,000 Units by mouth daily.    Marland Kitchen doxazosin (CARDURA) 8 MG tablet TAKE 1 TABLET DAILY (OFFICE VISIT IS DUE) 90 tablet 2  . escitalopram (LEXAPRO) 5 MG tablet TAKE 1 TABLET DAILY (OFFICE VISIT IS DUE) 90 tablet 3  . losartan (COZAAR) 25 MG tablet TAKE 1 TABLET TWICE A DAY (OFFICE VISIT IS DUE) 180 tablet 3  . Multiple Vitamin (MULTIVITAMIN) capsule Take 1 capsule by mouth daily.    . Omega-3 Fatty Acids (OMEGA 3 PO) Take 1 capsule by mouth daily. Mega red    . pantoprazole (PROTONIX) 40 MG tablet Take 1 tablet (40 mg total) by mouth daily. Office visit needed before refills will be given 90 tablet 3  . pneumococcal 23 valent vaccine (PNU-IMMUNE) injection 0.5 mL      No facility-administered medications prior to visit.    ROS: Review of Systems  Constitutional: Negative for activity change, appetite change, chills, fatigue and unexpected weight change.  HENT: Negative for congestion, mouth sores and sinus pressure.   Eyes: Negative for visual disturbance.  Respiratory: Negative for cough and chest tightness.   Gastrointestinal: Negative for abdominal pain and nausea.  Genitourinary: Negative for difficulty urinating, frequency and vaginal pain.  Musculoskeletal: Negative for back pain, gait problem and neck pain.  Skin: Negative for pallor and rash.  Neurological: Negative for dizziness, tremors, weakness, numbness and headaches.  Psychiatric/Behavioral: Negative for  confusion and sleep disturbance.    Objective:  BP 122/70 (BP Location: Left Arm)   Pulse 64   Temp 98.2 F (36.8 C) (Oral)   Ht 5\' 5"  (1.651 m)   Wt 278 lb 3.2 oz (126.2 kg)   SpO2 95%   BMI 46.29 kg/m   BP Readings from Last 3 Encounters:  11/30/20 122/70  11/30/20 122/70  05/29/20 (!) 136/82    Wt Readings from Last 3 Encounters:  11/30/20 278 lb 3.2 oz (126.2 kg)  11/30/20 278 lb 3.2 oz (126.2 kg)  05/29/20 (!) 280 lb (127 kg)    Physical Exam Constitutional:      General: She is not in acute distress.    Appearance: She is well-developed.  HENT:     Head: Normocephalic.     Right Ear: External ear normal.     Left Ear: External ear normal.     Nose: Nose normal.     Mouth/Throat:     Mouth: Oropharynx is clear and moist.  Eyes:     General:        Right eye: No discharge.        Left eye: No discharge.     Conjunctiva/sclera: Conjunctivae normal.     Pupils: Pupils are equal, round, and reactive to light.  Neck:     Thyroid: No thyromegaly.     Vascular: No JVD.     Trachea: No tracheal deviation.  Cardiovascular:  Rate and Rhythm: Normal rate and regular rhythm.     Heart sounds: Normal heart sounds.  Pulmonary:     Effort: No respiratory distress.     Breath sounds: No stridor. No wheezing.  Abdominal:     General: Bowel sounds are normal. There is no distension.     Palpations: Abdomen is soft. There is no mass.     Tenderness: There is no abdominal tenderness. There is no guarding or rebound.  Musculoskeletal:        General: No tenderness or edema.     Cervical back: Normal range of motion and neck supple.  Lymphadenopathy:     Cervical: No cervical adenopathy.  Skin:    Findings: No erythema or rash.  Neurological:     Cranial Nerves: No cranial nerve deficit.     Motor: No abnormal muscle tone.     Coordination: Coordination normal.     Deep Tendon Reflexes: Reflexes normal.  Psychiatric:        Mood and Affect: Mood and affect  normal.        Behavior: Behavior normal.        Thought Content: Thought content normal.        Judgment: Judgment normal.   mild goiter  Lab Results  Component Value Date   WBC 6.0 05/29/2020   HGB 10.4 (L) 05/29/2020   HCT 32.2 (L) 05/29/2020   PLT 288 05/29/2020   GLUCOSE 77 05/29/2020   CHOL 193 05/29/2020   TRIG 94 05/29/2020   HDL 51 05/29/2020   LDLCALC 122 (H) 05/29/2020   ALT 13 05/29/2020   AST 17 05/29/2020   NA 137 05/29/2020   K 4.7 05/29/2020   CL 101 05/29/2020   CREATININE 1.46 (H) 05/29/2020   BUN 21 05/29/2020   CO2 26 05/29/2020   TSH 1.47 05/29/2020   HGBA1C 6.0 03/11/2014    US Renal  Result Date: 01/09/2019 CLINICAL DATA:  Chronic renal insufficiency. EXAM: RENAL / URINARY TRACT ULTRASOUND COMPLETE COMPARISON:  None. FINDINGS: Right Kidney: Renal measurements: 13.2 x 4.0 x 4.5 cm = volume: 126 mL. Contains a 5.8 cm lower pole cyst. Left Kidney: Renal measurements: 11.3 x 5.5 x 5.2 cm = volume: 170 mL. Contains a 1.7 cm midpole cyst. Bladder: Appears normal for degree of bladder distention. IMPRESSION: Bilateral renal cysts.  No other abnormalities. Electronically Signed   By: Dorise Bullion III M.D   On: 01/09/2019 20:58    Assessment & Plan:    Walker Kehr, MD

## 2020-11-30 NOTE — Assessment & Plan Note (Signed)
Stable wt 

## 2020-11-30 NOTE — Patient Instructions (Addendum)
Jennifer Gibson , Thank you for taking time to come for your Medicare Wellness Visit. I appreciate your ongoing commitment to your health goals. Please review the following plan we discussed and let me know if I can assist you in the future.   Screening recommendations/referrals: Colonoscopy: 02/15/2016; due every 5 years  Mammogram: 06/15/2020 Bone Density: 04/05/2019 Recommended yearly ophthalmology/optometry visit for glaucoma screening and checkup Recommended yearly dental visit for hygiene and checkup  Vaccinations: Influenza vaccine: 08/31/2020 Pneumococcal vaccine: up to date Tdap vaccine: 06/30/2012; due every 10 years Shingles vaccine: never done   Covid-19: up to date  Advanced directives: Advance directive discussed with you today. I have provided a copy for you to complete at home and have notarized. Once this is complete please bring a copy in to our office so we can scan it into your chart.  Conditions/risks identified: Yes; Reviewed health maintenance screenings with patient today and relevant education, vaccines, and/or referrals were provided. Please continue to do your personal lifestyle choices by: daily care of teeth and gums, regular physical activity (goal should be 5 days a week for 30 minutes), eat a healthy diet, avoid tobacco and drug use, limiting any alcohol intake, taking a low-dose aspirin (if not allergic or have been advised by your provider otherwise) and taking vitamins and minerals as recommended by your provider. Continue doing brain stimulating activities (puzzles, reading, adult coloring books, staying active) to keep memory sharp. Continue to eat heart healthy diet (full of fruits, vegetables, whole grains, lean protein, water--limit salt, fat, and sugar intake) and increase physical activity as tolerated.  Next appointment: Please schedule your next Medicare Wellness Visit with your Nurse Health Advisor in 1 year by calling [phone].  Preventive Care 32  Years and Older, Female Preventive care refers to lifestyle choices and visits with your health care provider that can promote health and wellness. What does preventive care include?  A yearly physical exam. This is also called an annual well check.  Dental exams once or twice a year.  Routine eye exams. Ask your health care provider how often you should have your eyes checked.  Personal lifestyle choices, including:  Daily care of your teeth and gums.  Regular physical activity.  Eating a healthy diet.  Avoiding tobacco and drug use.  Limiting alcohol use.  Practicing safe sex.  Taking low-dose aspirin every day.  Taking vitamin and mineral supplements as recommended by your health care provider. What happens during an annual well check? The services and screenings done by your health care provider during your annual well check will depend on your age, overall health, lifestyle risk factors, and family history of disease. Counseling  Your health care provider may ask you questions about your:  Alcohol use.  Tobacco use.  Drug use.  Emotional well-being.  Home and relationship well-being.  Sexual activity.  Eating habits.  History of falls.  Memory and ability to understand (cognition).  Work and work Statistician.  Reproductive health. Screening  You may have the following tests or measurements:  Height, weight, and BMI.  Blood pressure.  Lipid and cholesterol levels. These may be checked every 5 years, or more frequently if you are over 74 years old.  Skin check.  Lung cancer screening. You may have this screening every year starting at age 73 if you have a 30-pack-year history of smoking and currently smoke or have quit within the past 15 years.  Fecal occult blood test (FOBT) of the stool. You may have  this test every year starting at age 44.  Flexible sigmoidoscopy or colonoscopy. You may have a sigmoidoscopy every 5 years or a colonoscopy every  10 years starting at age 40.  Hepatitis C blood test.  Hepatitis B blood test.  Sexually transmitted disease (STD) testing.  Diabetes screening. This is done by checking your blood sugar (glucose) after you have not eaten for a while (fasting). You may have this done every 1-3 years.  Bone density scan. This is done to screen for osteoporosis. You may have this done starting at age 33.  Mammogram. This may be done every 1-2 years. Talk to your health care provider about how often you should have regular mammograms. Talk with your health care provider about your test results, treatment options, and if necessary, the need for more tests. Vaccines  Your health care provider may recommend certain vaccines, such as:  Influenza vaccine. This is recommended every year.  Tetanus, diphtheria, and acellular pertussis (Tdap, Td) vaccine. You may need a Td booster every 10 years.  Zoster vaccine. You may need this after age 67.  Pneumococcal 13-valent conjugate (PCV13) vaccine. One dose is recommended after age 92.  Pneumococcal polysaccharide (PPSV23) vaccine. One dose is recommended after age 49. Talk to your health care provider about which screenings and vaccines you need and how often you need them. This information is not intended to replace advice given to you by your health care provider. Make sure you discuss any questions you have with your health care provider. Document Released: 11/17/2015 Document Revised: 07/10/2016 Document Reviewed: 08/22/2015 Elsevier Interactive Patient Education  2017 [place] Prevention in the Home Falls can cause injuries. They can happen to people of all ages. There are many things you can do to make your home safe and to help prevent falls. What can I do on the outside of my home?  Regularly fix the edges of walkways and driveways and fix any cracks.  Remove anything that might make you trip as you walk through a door, such as a raised step  or threshold.  Trim any bushes or trees on the path to your home.  Use bright outdoor lighting.  Clear any walking paths of anything that might make someone trip, such as rocks or tools.  Regularly check to see if handrails are loose or broken. Make sure that both sides of any steps have handrails.  Any raised decks and porches should have guardrails on the edges.  Have any leaves, snow, or ice cleared regularly.  Use sand or salt on walking paths during winter.  Clean up any spills in your garage right away. This includes oil or grease spills. What can I do in the bathroom?  Use night lights.  Install grab bars by the toilet and in the tub and shower. Do not use towel bars as grab bars.  Use non-skid mats or decals in the tub or shower.  If you need to sit down in the shower, use a plastic, non-slip stool.  Keep the floor dry. Clean up any water that spills on the floor as soon as it happens.  Remove soap buildup in the tub or shower regularly.  Attach bath mats securely with double-sided non-slip rug tape.  Do not have throw rugs and other things on the floor that can make you trip. What can I do in the bedroom?  Use night lights.  Make sure that you have a light by your bed that is easy to  reach.  Do not use any sheets or blankets that are too big for your bed. They should not hang down onto the floor.  Have a firm chair that has side arms. You can use this for support while you get dressed.  Do not have throw rugs and other things on the floor that can make you trip. What can I do in the kitchen?  Clean up any spills right away.  Avoid walking on wet floors.  Keep items that you use a lot in easy-to-reach places.  If you need to reach something above you, use a strong step stool that has a grab bar.  Keep electrical cords out of the way.  Do not use floor polish or wax that makes floors slippery. If you must use wax, use non-skid floor wax.  Do not have  throw rugs and other things on the floor that can make you trip. What can I do with my stairs?  Do not leave any items on the stairs.  Make sure that there are handrails on both sides of the stairs and use them. Fix handrails that are broken or loose. Make sure that handrails are as long as the stairways.  Check any carpeting to make sure that it is firmly attached to the stairs. Fix any carpet that is loose or worn.  Avoid having throw rugs at the top or bottom of the stairs. If you do have throw rugs, attach them to the floor with carpet tape.  Make sure that you have a light switch at the top of the stairs and the bottom of the stairs. If you do not have them, ask someone to add them for you. What else can I do to help prevent falls?  Wear shoes that:  Do not have high heels.  Have rubber bottoms.  Are comfortable and fit you well.  Are closed at the toe. Do not wear sandals.  If you use a stepladder:  Make sure that it is fully opened. Do not climb a closed stepladder.  Make sure that both sides of the stepladder are locked into place.  Ask someone to hold it for you, if possible.  Clearly mark and make sure that you can see:  Any grab bars or handrails.  First and last steps.  Where the edge of each step is.  Use tools that help you move around (mobility aids) if they are needed. These include:  Canes.  Walkers.  Scooters.  Crutches.  Turn on the lights when you go into a dark area. Replace any light bulbs as soon as they burn out.  Set up your furniture so you have a clear path. Avoid moving your furniture around.  If any of your floors are uneven, fix them.  If there are any pets around you, be aware of where they are.  Review your medicines with your doctor. Some medicines can make you feel dizzy. This can increase your chance of falling. Ask your doctor what other things that you can do to help prevent falls. This information is not intended to  replace advice given to you by your health care provider. Make sure you discuss any questions you have with your health care provider. Document Released: 08/17/2009 Document Revised: 03/28/2016 Document Reviewed: 11/25/2014 Elsevier Interactive Patient Education  2017 [employer].

## 2020-11-30 NOTE — Assessment & Plan Note (Signed)
A cardiac CT scan for calcium scoring offered 2/20 

## 2020-11-30 NOTE — Progress Notes (Addendum)
Subjective:   Jennifer Gibson is a 70 y.o. female who presents for Medicare Annual (Subsequent) preventive examination.  Review of Systems    No ROS. Medicare Wellness Visit. Additional risk factors are reflected in social history. Cardiac Risk Factors include: advanced age (>57men, >63 women);family history of premature cardiovascular disease;hypertension;obesity (BMI >30kg/m2)     Objective:    Today's Vitals   11/30/20 1233  BP: 122/70  Pulse: 64  Temp: 98.2 F (36.8 C)  SpO2: 95%  Weight: 278 lb 3.2 oz (126.2 kg)  Height: 5\' 5"  (1.651 m)  PainSc: 0-No pain   Body mass index is 46.29 kg/m.  Advanced Directives 11/30/2020 12/29/2017 02/01/2016 12/21/2015  Does Patient Have a Medical Advance Directive? No No No Yes  Copy of [place] in Chart? - - - Yes  Would patient like information on creating a medical advance directive? Yes (MAU/Ambulatory/Procedural Areas - Information given) Yes (ED - Information included in AVS) - -    Current Medications (verified) Outpatient Encounter Medications as of 11/30/2020  Medication Sig   ALDACTAZIDE 50-50 MG tablet TAKE 1 TABLET DAILY (OFFICE VISIT IS DUE)   carvedilol (COREG) 25 MG tablet TAKE 1 TABLET TWICE A DAY WITH MEALS   Cetirizine HCl (ZYRTEC ALLERGY) 10 MG CAPS Take by mouth.   Cholecalciferol (EQL VITAMIN D3) 1000 UNITS tablet Take 1,000 Units by mouth daily.     doxazosin (CARDURA) 8 MG tablet TAKE 1 TABLET DAILY (OFFICE VISIT IS DUE)   escitalopram (LEXAPRO) 5 MG tablet TAKE 1 TABLET DAILY (OFFICE VISIT IS DUE)   losartan (COZAAR) 25 MG tablet TAKE 1 TABLET TWICE A DAY (OFFICE VISIT IS DUE)   Multiple Vitamin (MULTIVITAMIN) capsule Take 1 capsule by mouth daily.   Omega-3 Fatty Acids (OMEGA 3 PO) Take 1 capsule by mouth daily. Mega red   pantoprazole (PROTONIX) 40 MG tablet Take 1 tablet (40 mg total) by mouth daily. Office visit needed before refills will be given   Facility-Administered Encounter  Medications as of 11/30/2020  Medication   pneumococcal 23 valent vaccine (PNU-IMMUNE) injection 0.5 mL    Allergies (verified) Grass pollen([id number]-swt vern) and Penicillins   History: Past Medical History:  Diagnosis Date   Allergy    Anemia    Anxiety    Detached retina, right 2009   Hypertension    Morbid obesity ([place])    Sleep apnea    doesnt wear   Venous insufficiency of leg    right foot / ankle -sev.yeras ago   Past Surgical History:  Procedure Laterality Date   ABDOMINAL HYSTERECTOMY     partial   COLONOSCOPY     POLYPECTOMY     Family History  Problem Relation Age of Onset   Heart disease Father    Hypertension Other    Colon polyps Brother        benign   Colon cancer Neg Hx    Esophageal cancer Neg Hx    Pancreatic cancer Neg Hx    Rectal cancer Neg Hx    Stomach cancer Neg Hx    Social History   Socioeconomic History   Marital status: Widowed    Spouse name: Not on file   Number of children: Not on file   Years of education: Not on file   Highest education level: Not on file  Occupational History   Not on file  Tobacco Use   Smoking status: Never Smoker   Smokeless tobacco: Never Used  Tobacco comment: alot of second hand smoke  Substance and Sexual Activity   Alcohol use: No    Alcohol/week: 0.0 standard drinks   Drug use: No   Sexual activity: Not on file  Other Topics Concern   Not on file  Social History Narrative   Not on file   Social Determinants of Health   Financial Resource Strain: Low Risk    Difficulty of Paying Living Expenses: Not hard at all  Food Insecurity: No Food Insecurity   Worried About Charity fundraiser in the Last Year: Never true   Ran Out of Food in the Last Year: Never true  Transportation Needs: No Transportation Needs   Lack of Transportation (Medical): No   Lack of Transportation (Non-Medical): No  Physical Activity: Sufficiently Active   Days of Exercise per Week: 5 days   Minutes of Exercise  per Session: 30 min  Stress: No Stress Concern Present   Feeling of Stress : Not at all  Social Connections: Moderately Isolated   Frequency of Communication with Friends and Family: More than three times a week   Frequency of Social Gatherings with Friends and Family: Once a week   Attends Religious Services: Never   Marine scientist or Organizations: No   Attends Music therapist: Never   Marital Status: Married    Tobacco Counseling Counseling given: Not Answered Comment: alot of second hand smoke   Clinical Intake:  Pre-visit preparation completed: Yes  Pain Score: 0-No pain     Diabetes: No  How often do you need to have someone help you when you read instructions, pamphlets, or other written materials from your doctor or pharmacy?: 1 - Never  Diabetic? no  Interpreter Needed?: No  Information entered by :: Lisette Abu, LPN   Activities of Daily Living In your present state of health, do you have any difficulty performing the following activities: 11/30/2020  Hearing? N  Vision? N  Difficulty concentrating or making decisions? N  Walking or climbing stairs? N  Dressing or bathing? N  Doing errands, shopping? N  Preparing Food and eating ? N  Using the Toilet? N  In the past six months, have you accidently leaked urine? N  Do you have problems with loss of bowel control? N  Managing your Medications? N  Housekeeping or managing your Housekeeping? N  Some recent data might be hidden    Patient Care Team: Plotnikov, Evie Lacks, MD as PCP - General Louretta Shorten, MD as Registered Nurse (Obstetrics and Gynecology) Ladene Artist, MD (Gastroenterology)  Indicate any recent Medical Services you may have received from other than [facility] providers in the past year (date may be approximate).     Assessment:   This is a routine wellness examination for [place].  Hearing/Vision screen No exam data present  Dietary issues and exercise  activities discussed: Current Exercise Habits: Home exercise routine, Type of exercise: walking, Time (Minutes): 30, Frequency (Times/Week): 5, Weekly Exercise (Minutes/Week): 150, Intensity: Moderate, Exercise limited by: None identified  Goals      Client understands the importance of follow-up with providers by attending scheduled visits     This year I would like to increase my activity, lose weight and travel more.     Exercise 150 minutes per week (moderate activity)     [facility] 30 minutes and will check on water aerobics      Patient Stated     Increase  physical activity to maintain strength and keep my heart strong. Continue to worship God, love family, and enjoy life.       Depression Screen PHQ 2/9 Scores 11/30/2020 05/29/2020 12/31/2018 12/29/2017 12/29/2017 12/21/2015  PHQ - 2 Score 0 0 1 2 0 0  PHQ- 9 Score - - - 3 - -    Fall Risk Fall Risk  11/30/2020 05/29/2020 12/31/2018 12/29/2017 12/29/2017  Falls in the past year? 0 0 0 No No  Comment - - - - -  Number falls in past yr: 0 0 - - -  Injury with Fall? 0 0 - - -  Risk for fall due to : No Fall Risks - - - -  Follow up - - Falls evaluation completed - -    FALL RISK PREVENTION PERTAINING TO THE HOME:  Any stairs in or around the home? No  If so, are there any without handrails? No  Home free of loose throw rugs in walkways, pet beds, electrical cords, etc? Yes  Adequate lighting in your home to reduce risk of falls? Yes   ASSISTIVE DEVICES UTILIZED TO PREVENT FALLS:  Life alert? No  Use of a cane, walker or w/c? No  Grab bars in the bathroom? Yes  Shower chair or bench in shower? Yes  Elevated toilet seat or a handicapped toilet? Yes   TIMED UP AND GO:  Was the test performed? No .  Length of time to ambulate 10 feet: 0 sec.   Gait steady and fast without use of assistive device  Cognitive Function: MMSE - Mini Mental State Exam 12/21/2015  Not completed: (No Data)   Normal cognitive  status assessed by direct observation by this Nurse Health Advisor. No abnormalities found.        Immunizations Immunization History  Administered Date(s) Administered   Fluad Quad(high Dose 65+) 07/01/2019   Influenza, High Dose Seasonal PF 12/31/2018, 08/31/2020   Influenza,inj,Quad PF,6+ Mos 08/02/2014, 08/01/2015   Influenza-Unspecified 10/30/2016, 08/29/2017   PFIZER(Purple Top)SARS-COV-2 Vaccination 12/17/2019, 01/11/2020, 10/18/2020   Pneumococcal Conjugate-13 08/02/2014, 08/29/2017   Pneumococcal Polysaccharide-23 09/01/2014   Td 11/06/1992, 06/30/2012   Zoster 10/31/2014    TDAP status: Up to date  Flu Vaccine status: Up to date  Pneumococcal vaccine status: Up to date  Covid-19 vaccine status: Completed vaccines  Qualifies for Shingles Vaccine? Yes   Zostavax completed Yes   Shingrix Completed?: No.    Education has been provided regarding the importance of this vaccine. Patient has been advised to call insurance company to determine out of pocket expense if they have not yet received this vaccine. Advised may also receive vaccine at local pharmacy or Health Dept. Verbalized acceptance and understanding.  Screening Tests Health Maintenance  Topic Date Due   MAMMOGRAM  10/10/2017   COLONOSCOPY (Pts 45-63yrs Insurance coverage will need to be confirmed)  02/14/2021   TETANUS/TDAP  06/30/2022   INFLUENZA VACCINE  Completed   DEXA SCAN  Completed   COVID-19 Vaccine  Completed   Hepatitis C Screening  Completed   PNA vac Low Risk Adult  Completed    Health Maintenance  Health Maintenance Due  Topic Date Due   MAMMOGRAM  10/10/2017    Colorectal cancer screening: Type of screening: Colonoscopy. Completed 02/15/2016. Repeat every 5 years  Mammogram status: Completed 06/15/2020. Repeat every year  Bone Density status: Completed 04/05/2019. Results reflect: Bone density results: NORMAL. Repeat every 3 years.  Lung Cancer Screening: (Low Dose CT Chest  recommended if Age  55-80 years, 30 pack-year currently smoking OR have quit w/in 15years.) does not qualify.   Lung Cancer Screening Referral: no  Additional Screening:  Hepatitis C Screening: does qualify; Completed yes  Vision Screening: Recommended annual ophthalmology exams for early detection of glaucoma and other disorders of the eye. Is the patient up to date with their annual eye exam?  Yes  Who is the provider or what is the name of the office in which the patient attends annual eye exams? [place] If pt is not established with a provider, would they like to be referred to a provider to establish care? No .   Dental Screening: Recommended annual dental exams for proper oral hygiene  Community Resource Referral / Chronic Care Management: CRR required this visit?  No   CCM required this visit?  No      Plan:     I have personally reviewed and noted the following in the patient's chart:   Medical and social history Use of alcohol, tobacco or illicit drugs  Current medications and supplements Functional ability and status Nutritional status Physical activity Advanced directives List of other physicians Hospitalizations, surgeries, and ER visits in previous 12 months Vitals Screenings to include cognitive, depression, and falls Referrals and appointments  In addition, I have reviewed and discussed with patient certain preventive protocols, quality metrics, and best practice recommendations. A written personalized care plan for preventive services as well as general preventive health recommendations were provided to patient.     Sheral Flow, LPN   [id number]   Nurse Notes: n/a   Medical screening examination/treatment/procedure(s) were performed by non-physician practitioner and as supervising physician I was immediately available for consultation/collaboration.  I agree with above. Lew Dawes, MD

## 2020-11-30 NOTE — Assessment & Plan Note (Signed)
Lexapro - low dose since 2/18

## 2020-12-18 ENCOUNTER — Ambulatory Visit
Admission: RE | Admit: 2020-12-18 | Discharge: 2020-12-18 | Disposition: A | Discharge status: Home / Self Care | Payer: Medicare Other | Source: Ambulatory Visit | Attending: Internal Medicine | Admitting: Internal Medicine

## 2020-12-18 DIAGNOSIS — E041 Nontoxic single thyroid nodule: Secondary | ICD-10-CM | POA: Diagnosis not present

## 2021-03-05 ENCOUNTER — Other Ambulatory Visit: Payer: Self-pay | Admitting: Internal Medicine

## 2021-03-05 MED ORDER — PANTOPRAZOLE SODIUM 40 MG PO TBEC
40.0000 mg | DELAYED_RELEASE_TABLET | Freq: Every day | ORAL | 3 refills | Status: DC
Start: 2021-03-05 — End: 2021-06-26

## 2021-04-11 DIAGNOSIS — Z03818 Encounter for observation for suspected exposure to other biological agents ruled out: Secondary | ICD-10-CM | POA: Diagnosis not present

## 2021-05-24 ENCOUNTER — Other Ambulatory Visit: Payer: Self-pay | Admitting: Internal Medicine

## 2021-05-24 MED ORDER — CARVEDILOL 25 MG PO TABS: 25.0000 mg | ORAL_TABLET | Freq: Two times a day (BID) | ORAL | 3 refills | Status: DC

## 2021-05-24 NOTE — Addendum Note (Signed)
Addended by: Earnstine Regal on: 05/24/2021 08:05 AM   Modules accepted: Orders

## 2021-05-28 ENCOUNTER — Other Ambulatory Visit: Payer: Self-pay | Admitting: Internal Medicine

## 2021-05-29 ENCOUNTER — Other Ambulatory Visit: Payer: Self-pay | Admitting: Internal Medicine

## 2021-05-30 ENCOUNTER — Ambulatory Visit: Payer: Medicare Other | Admitting: Internal Medicine

## 2021-06-07 ENCOUNTER — Encounter: Payer: Self-pay | Admitting: Internal Medicine

## 2021-06-07 ENCOUNTER — Other Ambulatory Visit: Payer: Self-pay

## 2021-06-07 ENCOUNTER — Ambulatory Visit (INDEPENDENT_AMBULATORY_CARE_PROVIDER_SITE_OTHER): Payer: Medicare Other | Admitting: Internal Medicine

## 2021-06-07 VITALS — BP 124/80 | HR 79 | Temp 98.2°F | Ht 65.0 in | Wt 279.0 lb

## 2021-06-07 DIAGNOSIS — Z6841 Body Mass Index (BMI) 40.0 and over, adult: Secondary | ICD-10-CM

## 2021-06-07 DIAGNOSIS — N183 Chronic kidney disease, stage 3 unspecified: Secondary | ICD-10-CM | POA: Diagnosis not present

## 2021-06-07 DIAGNOSIS — D649 Anemia, unspecified: Secondary | ICD-10-CM | POA: Diagnosis not present

## 2021-06-07 DIAGNOSIS — E785 Hyperlipidemia, unspecified: Secondary | ICD-10-CM | POA: Diagnosis not present

## 2021-06-07 DIAGNOSIS — I10 Essential (primary) hypertension: Secondary | ICD-10-CM

## 2021-06-07 DIAGNOSIS — K219 Gastro-esophageal reflux disease without esophagitis: Secondary | ICD-10-CM

## 2021-06-07 DIAGNOSIS — M722 Plantar fascial fibromatosis: Secondary | ICD-10-CM | POA: Diagnosis not present

## 2021-06-07 LAB — URINALYSIS
Bilirubin Urine: NEGATIVE
Hgb urine dipstick: NEGATIVE
Ketones, ur: NEGATIVE
Leukocytes,Ua: NEGATIVE
Nitrite: NEGATIVE
Specific Gravity, Urine: 1.01 (ref 1.000–1.030)
Total Protein, Urine: NEGATIVE
Urine Glucose: NEGATIVE
Urobilinogen, UA: 0.2 (ref 0.0–1.0)
pH: 6.5 (ref 5.0–8.0)

## 2021-06-07 LAB — COMPREHENSIVE METABOLIC PANEL
ALT: 10 U/L (ref 0–35)
AST: 15 U/L (ref 0–37)
Albumin: 3.9 g/dL (ref 3.5–5.2)
Alkaline Phosphatase: 56 U/L (ref 39–117)
BUN: 17 mg/dL (ref 6–23)
CO2: 28 mEq/L (ref 19–32)
Calcium: 9.7 mg/dL (ref 8.4–10.5)
Chloride: 103 mEq/L (ref 96–112)
Creatinine, Ser: 1.26 mg/dL — ABNORMAL HIGH (ref 0.40–1.20)
GFR: 42.65 mL/min — ABNORMAL LOW (ref >60.00)
Glucose, Bld: 97 mg/dL (ref 70–99)
Potassium: 4.3 mEq/L (ref 3.5–5.1)
Sodium: 139 mEq/L (ref 135–145)
Total Bilirubin: 0.4 mg/dL (ref 0.2–1.2)
Total Protein: 7.6 g/dL (ref 6.0–8.3)

## 2021-06-07 LAB — CBC WITH DIFFERENTIAL/PLATELET
Basophils Absolute: 0 10*3/uL (ref 0.0–0.1)
Basophils Relative: 0.5 % (ref 0.0–3.0)
Eosinophils Absolute: 0.1 10*3/uL (ref 0.0–0.7)
Eosinophils Relative: 1.4 % (ref 0.0–5.0)
HCT: 33.1 % — ABNORMAL LOW (ref 36.0–46.0)
Hemoglobin: 10.5 g/dL — ABNORMAL LOW (ref 12.0–15.0)
Lymphocytes Relative: 28.7 % (ref 12.0–46.0)
Lymphs Abs: 1.6 10*3/uL (ref 0.7–4.0)
MCHC: 31.7 g/dL (ref 30.0–36.0)
MCV: 81.2 fl (ref 78.0–100.0)
Monocytes Absolute: 0.6 10*3/uL (ref 0.1–1.0)
Monocytes Relative: 10.8 % (ref 3.0–12.0)
Neutro Abs: 3.2 10*3/uL (ref 1.4–7.7)
Neutrophils Relative %: 58.6 % (ref 43.0–77.0)
Platelets: 252 10*3/uL (ref 150.0–400.0)
RBC: 4.08 Mil/uL (ref 3.87–5.11)
RDW: 13.6 % (ref 11.5–15.5)
WBC: 5.5 10*3/uL (ref 4.0–10.5)

## 2021-06-07 LAB — LIPID PANEL
Cholesterol: 187 mg/dL (ref 0–200)
HDL: 45 mg/dL (ref >39.00)
LDL Cholesterol: 128 mg/dL — ABNORMAL HIGH (ref 0–99)
NonHDL: 142.01
Total CHOL/HDL Ratio: 4
Triglycerides: 70 mg/dL (ref 0.0–149.0)
VLDL: 14 mg/dL (ref 0.0–40.0)

## 2021-06-07 LAB — TSH: TSH: 1.92 u[IU]/mL (ref 0.35–5.50)

## 2021-06-07 NOTE — Progress Notes (Signed)
Subjective:  Patient ID: Jennifer Gibson, female    DOB: August 14, 1951  Age: 70 y.o. MRN: [id number]  CC: No chief complaint on file.   HPI Jennifer Gibson presents for R heel pain, HTN, GERD    Outpatient Medications Prior to Visit  Medication Sig Dispense Refill   ALDACTAZIDE 50-50 MG tablet TAKE 1 TABLET DAILY (OFFICE VISIT IS DUE) 90 tablet 2   carvedilol (COREG) 25 MG tablet Take 1 tablet (25 mg total) by mouth 2 (two) times daily with a meal. 180 tablet 3   Cetirizine HCl 10 MG CAPS Take by mouth.     Cholecalciferol (VITAMIN D3) 50 MCG (2000 UT) capsule Take 1 capsule (2,000 Units total) by mouth daily. 100 capsule 3   doxazosin (CARDURA) 8 MG tablet TAKE 1 TABLET DAILY (OFFICE VISIT IS DUE) 90 tablet 2   escitalopram (LEXAPRO) 5 MG tablet TAKE 1 TABLET DAILY (OFFICE VISIT IS DUE) 90 tablet 3   losartan (COZAAR) 25 MG tablet TAKE 1 TABLET TWICE A DAY (OFFICE VISIT IS DUE) 180 tablet 3   Multiple Vitamin (MULTIVITAMIN) capsule Take 1 capsule by mouth daily.     Omega-3 Fatty Acids (OMEGA 3 PO) Take 1 capsule by mouth daily. Mega red     pantoprazole (PROTONIX) 40 MG tablet Take 1 tablet (40 mg total) by mouth daily. Annual appt due in July must see provider for future refills 90 tablet 3   No facility-administered medications prior to visit.    ROS: Review of Systems  Constitutional:  Negative for activity change, appetite change, chills, fatigue and unexpected weight change.  HENT:  Negative for congestion, mouth sores and sinus pressure.   Eyes:  Negative for visual disturbance.  Respiratory:  Negative for cough and chest tightness.   Gastrointestinal:  Negative for abdominal pain and nausea.  Genitourinary:  Negative for difficulty urinating, frequency and vaginal pain.  Musculoskeletal:  Positive for arthralgias. Negative for back pain and gait problem.  Skin:  Negative for pallor and rash.  Neurological:  Negative for dizziness, tremors, weakness, numbness and headaches.   Psychiatric/Behavioral:  Negative for confusion and sleep disturbance.    Objective:  BP 124/80 (BP Location: Left Arm, Patient Position: Sitting, Cuff Size: Normal)   Pulse 79   Temp 98.2 F (36.8 C) (Oral)   Ht '5\' 5"'$  (1.651 m)   Wt 279 lb (126.6 kg)   SpO2 93%   BMI 46.43 kg/m   BP Readings from Last 3 Encounters:  06/07/21 124/80  11/30/20 122/70  11/30/20 122/70    Wt Readings from Last 3 Encounters:  06/07/21 279 lb (126.6 kg)  11/30/20 278 lb 3.2 oz (126.2 kg)  11/30/20 278 lb 3.2 oz (126.2 kg)    Physical Exam Constitutional:      General: She is not in acute distress.    Appearance: She is well-developed. She is obese.  HENT:     Head: Normocephalic.     Right Ear: External ear normal.     Left Ear: External ear normal.     Nose: Nose normal.  Eyes:     General:        Right eye: No discharge.        Left eye: No discharge.     Conjunctiva/sclera: Conjunctivae normal.     Pupils: Pupils are equal, round, and reactive to light.  Neck:     Thyroid: No thyromegaly.     Vascular: No JVD.     Trachea: No  tracheal deviation.  Cardiovascular:     Rate and Rhythm: Normal rate and regular rhythm.     Heart sounds: Normal heart sounds.  Pulmonary:     Effort: No respiratory distress.     Breath sounds: No stridor. No wheezing.  Abdominal:     General: Bowel sounds are normal. There is no distension.     Palpations: Abdomen is soft. There is no mass.     Tenderness: There is no abdominal tenderness. There is no guarding or rebound.  Musculoskeletal:        General: Tenderness present.     Cervical back: Normal range of motion and neck supple. No rigidity.  Lymphadenopathy:     Cervical: No cervical adenopathy.  Skin:    Findings: No erythema or rash.  Neurological:     Cranial Nerves: No cranial nerve deficit.     Motor: No abnormal muscle tone.     Coordination: Coordination normal.     Deep Tendon Reflexes: Reflexes normal.  Psychiatric:         Behavior: Behavior normal.        Thought Content: Thought content normal.        Judgment: Judgment normal.  R heel w/pain  Lab Results  Component Value Date   WBC 6.0 05/29/2020   HGB 10.4 (L) 05/29/2020   HCT 32.2 (L) 05/29/2020   PLT 288 05/29/2020   GLUCOSE 81 11/30/2020   CHOL 193 05/29/2020   TRIG 94 05/29/2020   HDL 51 05/29/2020   LDLCALC 122 (H) 05/29/2020   ALT 11 11/30/2020   AST 14 11/30/2020   NA 137 11/30/2020   K 4.2 11/30/2020   CL 101 11/30/2020   CREATININE 1.24 (H) 11/30/2020   BUN 21 11/30/2020   CO2 32 11/30/2020   TSH 1.23 11/30/2020   HGBA1C 6.0 03/11/2014    US THYROID  Result Date: 12/18/2020 CLINICAL DATA:  Goiter.  Left thyroid nodule biopsy on 03/07/2010. EXAM: THYROID ULTRASOUND TECHNIQUE: Ultrasound examination of the thyroid gland and adjacent soft tissues was performed. COMPARISON:  02/23/2010 FINDINGS: Parenchymal Echotexture: Mildly heterogenous Isthmus: 0.3 cm, previously 0.2 cm Right lobe: 5.0 x 1.6 x 1.8 cm, previously 5.1 x 1.3 x 1.9 cm Left lobe: 6.4 x 3.2 x 2.8 cm, previously 5.4 x 2.2 x 2.5 cm _________________________________________________________ Estimated total number of nodules >/= 1 cm: 2 Number of spongiform nodules >/=  2 cm not described below (TR1): 0 Number of mixed cystic and solid nodules >/= 1.5 cm not described below ([place]): 0 _________________________________________________________ Nodule # 4: Location: Right; Inferior Maximum size: 0.8 cm; Other 2 dimensions: 0.8 x 0.8 cm Composition: cannot determine (2) Echogenicity: hypoechoic (2) Shape: not taller-than-wide (0) Margins: ill-defined (0) Echogenic foci: none (0) ACR TI-RADS total points: 4. ACR TI-RADS risk category: TR4 (4-6 points). ACR TI-RADS recommendations: Given size (<0.9 cm) and appearance, this nodule does NOT meet TI-RADS criteria for biopsy or dedicated follow-up. _________________________________________________________ Nodule #5 is a primarily cystic nodule in  the left superior thyroid lobe that measures 1.0 x 0.6 x 1.1 cm. Nodule #6 is the previously biopsied nodule in the left inferior thyroid nodule. This nodule measures 4.5 x 2.6 x 3.1 cm and previously measured 3.9 x 1.8 x 2.7 cm. This nodule is heterogeneous and slightly hypoechoic. Morphology has not changed since 2011. IMPRESSION: 1. Multinodular goiter. 2. Previously biopsied nodule in the left inferior thyroid lobe has slightly enlarged. Morphology of this nodule has not significantly changed since 2011. 3. No  new suspicious thyroid nodules. The above is in keeping with the ACR TI-RADS recommendations - J Am Coll Radiol 2017;14:587-595. Electronically Signed   By: Markus Daft M.D.   On: 12/18/2020 15:30    Assessment & Plan:     Walker Kehr, MD

## 2021-06-07 NOTE — Addendum Note (Signed)
Addended by: Jacobo Forest on: 06/07/2021 09:44 AM   Modules accepted: Orders

## 2021-06-07 NOTE — Assessment & Plan Note (Addendum)
Will check CMET.  Continue with good hydration

## 2021-06-07 NOTE — Assessment & Plan Note (Addendum)
R heel Good shoes Voltaren gel

## 2021-06-07 NOTE — Patient Instructions (Signed)
For a mild COVID-19 case - take zinc 50 mg a day for 1 week, vitamin C 1000 mg daily for 1 week, vitamin D2 50,000 units weekly for 2 months (unless  taking vitamin D daily already), an antioxidant Quercetin 500 mg twice a day for 1 week (if you can get it quick enough). Take Allegra or Benadryl.  Maintain good oral hydration and take Tylenol for high fever.  Call if problems. Isolate for 5 days, then wear a mask for 5 days per CDC.  

## 2021-06-07 NOTE — Assessment & Plan Note (Signed)
No change 

## 2021-06-07 NOTE — Assessment & Plan Note (Signed)
Cont w/Protonix 

## 2021-06-07 NOTE — Assessment & Plan Note (Addendum)
Cont w/Coreg, Spironolactone, Doxazosin, Losartan CMET

## 2021-06-10 NOTE — Assessment & Plan Note (Signed)
Chronic -possibly?renal insufficiency anemia.  Stable.  CBC ordered.

## 2021-06-11 ENCOUNTER — Other Ambulatory Visit: Payer: Self-pay | Admitting: Internal Medicine

## 2021-06-25 NOTE — Telephone Encounter (Signed)
   Patient requesting refill for  pantoprazole (PROTONIX) 40 MG tablet  ALDACTAZIDE 50-50 MG tablet pantoprazole (PROTONIX) 40 MG tablet   [facility] [place], [place]

## 2021-06-26 MED ORDER — ESCITALOPRAM OXALATE 5 MG PO TABS: ORAL_TABLET | ORAL | 3 refills | Status: DC

## 2021-06-26 MED ORDER — PANTOPRAZOLE SODIUM 40 MG PO TBEC: 40.0000 mg | DELAYED_RELEASE_TABLET | Freq: Every day | ORAL | 3 refills | Status: DC

## 2021-06-26 MED ORDER — LOSARTAN POTASSIUM 25 MG PO TABS: ORAL_TABLET | ORAL | 3 refills | Status: DC

## 2021-06-26 MED ORDER — ALDACTAZIDE 50-50 MG PO TABS: ORAL_TABLET | ORAL | 3 refills | Status: DC

## 2021-06-26 NOTE — Telephone Encounter (Signed)
Reviewed chart pt is up-to-date [facility].../lmb  

## 2021-07-02 NOTE — Assessment & Plan Note (Signed)
-

## 2021-07-02 NOTE — Assessment & Plan Note (Signed)
Continue with good hydration.  Blood pressure control

## 2021-07-02 NOTE — Assessment & Plan Note (Signed)
Continue with Coreg, spironolactone, doxazosin, losartan

## 2021-07-03 DIAGNOSIS — Z6841 Body Mass Index (BMI) 40.0 and over, adult: Secondary | ICD-10-CM | POA: Diagnosis not present

## 2021-07-03 DIAGNOSIS — Z7689 Persons encountering health services in other specified circumstances: Secondary | ICD-10-CM | POA: Diagnosis not present

## 2021-07-03 DIAGNOSIS — Z01419 Encounter for gynecological examination (general) (routine) without abnormal findings: Secondary | ICD-10-CM | POA: Diagnosis not present

## 2021-07-03 DIAGNOSIS — Z1231 Encounter for screening mammogram for malignant neoplasm of breast: Secondary | ICD-10-CM | POA: Diagnosis not present

## 2021-07-03 DIAGNOSIS — N958 Other specified menopausal and perimenopausal disorders: Secondary | ICD-10-CM | POA: Diagnosis not present

## 2021-07-06 LAB — HM MAMMOGRAPHY

## 2021-07-11 ENCOUNTER — Encounter: Payer: Self-pay | Admitting: Internal Medicine

## 2021-07-25 DIAGNOSIS — Z23 Encounter for immunization: Secondary | ICD-10-CM | POA: Diagnosis not present

## 2021-08-04 DIAGNOSIS — Z20822 Contact with and (suspected) exposure to covid-19: Secondary | ICD-10-CM | POA: Diagnosis not present

## 2021-08-20 MED ORDER — CARVEDILOL 25 MG PO TABS: 25.0000 mg | ORAL_TABLET | Freq: Two times a day (BID) | ORAL | 3 refills | Status: DC

## 2021-09-04 DIAGNOSIS — Z20822 Contact with and (suspected) exposure to covid-19: Secondary | ICD-10-CM | POA: Diagnosis not present

## 2021-11-06 DIAGNOSIS — Z23 Encounter for immunization: Secondary | ICD-10-CM | POA: Diagnosis not present

## 2021-12-07 ENCOUNTER — Other Ambulatory Visit: Payer: Self-pay

## 2021-12-07 ENCOUNTER — Telehealth: Payer: Self-pay

## 2021-12-07 NOTE — Telephone Encounter (Signed)
Memory full; not able to leave message.

## 2021-12-07 NOTE — Progress Notes (Signed)
This encounter was created in error - please disregard.

## 2021-12-10 ENCOUNTER — Ambulatory Visit: Payer: Medicare Other | Admitting: Internal Medicine

## 2021-12-14 ENCOUNTER — Encounter: Payer: Self-pay | Admitting: Internal Medicine

## 2021-12-14 ENCOUNTER — Ambulatory Visit (INDEPENDENT_AMBULATORY_CARE_PROVIDER_SITE_OTHER): Payer: Medicare Other | Admitting: Internal Medicine

## 2021-12-14 ENCOUNTER — Other Ambulatory Visit: Payer: Self-pay

## 2021-12-14 DIAGNOSIS — F419 Anxiety disorder, unspecified: Secondary | ICD-10-CM | POA: Diagnosis not present

## 2021-12-14 DIAGNOSIS — N183 Chronic kidney disease, stage 3 unspecified: Secondary | ICD-10-CM | POA: Diagnosis not present

## 2021-12-14 DIAGNOSIS — I10 Essential (primary) hypertension: Secondary | ICD-10-CM | POA: Diagnosis not present

## 2021-12-14 DIAGNOSIS — Z6841 Body Mass Index (BMI) 40.0 and over, adult: Secondary | ICD-10-CM

## 2021-12-14 LAB — URINALYSIS, ROUTINE W REFLEX MICROSCOPIC
Bilirubin Urine: NEGATIVE
Hgb urine dipstick: NEGATIVE
Ketones, ur: NEGATIVE
Nitrite: NEGATIVE
Specific Gravity, Urine: 1.015 (ref 1.000–1.030)
Total Protein, Urine: NEGATIVE
Urine Glucose: NEGATIVE
Urobilinogen, UA: 0.2 (ref 0.0–1.0)
pH: 6 (ref 5.0–8.0)

## 2021-12-14 LAB — CBC WITH DIFFERENTIAL/PLATELET
Basophils Absolute: 0 10*3/uL (ref 0.0–0.1)
Basophils Relative: 0.3 % (ref 0.0–3.0)
Eosinophils Absolute: 0.2 10*3/uL (ref 0.0–0.7)
Eosinophils Relative: 2.7 % (ref 0.0–5.0)
HCT: 31.9 % — ABNORMAL LOW (ref 36.0–46.0)
Hemoglobin: 10.2 g/dL — ABNORMAL LOW (ref 12.0–15.0)
Lymphocytes Relative: 26.2 % (ref 12.0–46.0)
Lymphs Abs: 1.6 10*3/uL (ref 0.7–4.0)
MCHC: 32 g/dL (ref 30.0–36.0)
MCV: 81.4 fl (ref 78.0–100.0)
Monocytes Absolute: 0.6 10*3/uL (ref 0.1–1.0)
Monocytes Relative: 10.5 % (ref 3.0–12.0)
Neutro Abs: 3.7 10*3/uL (ref 1.4–7.7)
Neutrophils Relative %: 60.3 % (ref 43.0–77.0)
Platelets: 269 10*3/uL (ref 150.0–400.0)
RBC: 3.92 Mil/uL (ref 3.87–5.11)
RDW: 13.6 % (ref 11.5–15.5)
WBC: 6.1 10*3/uL (ref 4.0–10.5)

## 2021-12-14 LAB — COMPREHENSIVE METABOLIC PANEL
ALT: 12 U/L (ref 0–35)
AST: 16 U/L (ref 0–37)
Albumin: 3.8 g/dL (ref 3.5–5.2)
Alkaline Phosphatase: 57 U/L (ref 39–117)
BUN: 22 mg/dL (ref 6–23)
CO2: 33 mEq/L — ABNORMAL HIGH (ref 19–32)
Calcium: 9.8 mg/dL (ref 8.4–10.5)
Chloride: 100 mEq/L (ref 96–112)
Creatinine, Ser: 1.37 mg/dL — ABNORMAL HIGH (ref 0.40–1.20)
GFR: 38.43 mL/min — ABNORMAL LOW (ref >60.00)
Glucose, Bld: 91 mg/dL (ref 70–99)
Potassium: 4.2 mEq/L (ref 3.5–5.1)
Sodium: 138 mEq/L (ref 135–145)
Total Bilirubin: 0.5 mg/dL (ref 0.2–1.2)
Total Protein: 7.8 g/dL (ref 6.0–8.3)

## 2021-12-14 LAB — TSH: TSH: 2.48 u[IU]/mL (ref 0.35–5.50)

## 2021-12-14 LAB — VITAMIN B12: Vitamin B-12: 379 pg/mL (ref 211–911)

## 2021-12-14 MED ORDER — PHENTERMINE HCL 37.5 MG PO TABS: 37.5000 mg | ORAL_TABLET | Freq: Every day | ORAL | 2 refills | Status: DC

## 2021-12-14 NOTE — Patient Instructions (Addendum)
Check on Wegovy, Saxenda coverage  For a mild COVID-19 case - take zinc 50 mg a day for 1 week, vitamin C 1000 mg daily for 1 week, vitamin D2 50,000 units weekly for 2 months (unless  taking vitamin D daily already), an antioxidant Quercetin 500 mg twice a day for 1 week (if you can get it quick enough). Take Allegra or Benadryl.  Maintain good oral hydration and take Tylenol for high fever.  Call if problems. Isolate for 5 days, then wear a mask for 5 days per CDC.   Cardiac CT calcium scoring test $99 Tel # is [phone]   Computed tomography, more commonly known as a CT or CAT scan, is a diagnostic medical imaging test. Like traditional x-rays, it produces multiple images or pictures of the inside of the body. The cross-sectional images generated during a CT scan can be reformatted in multiple planes. They can even generate three-dimensional images. These images can be viewed on a computer monitor, printed on film or by a 3D printer, or transferred to a CD or DVD. CT images of internal organs, bones, soft tissue and blood vessels provide greater detail than traditional x-rays, particularly of soft tissues and blood vessels. A cardiac CT scan for coronary calcium is a non-invasive way of obtaining information about the presence, location and extent of calcified plaque in the coronary arteries--the vessels that supply oxygen-containing blood to the heart muscle. Calcified plaque results when there is a build-up of fat and other substances under the inner layer of the artery. This material can calcify which signals the presence of atherosclerosis, a disease of the vessel wall, also called coronary artery disease (CAD). People with this disease have an increased risk for heart attacks. In addition, over time, progression of plaque build up (CAD) can narrow the arteries or even close off blood flow to the heart. The result may be chest pain, sometimes called "angina," or a heart attack. Because  calcium is a marker of CAD, the amount of calcium detected on a cardiac CT scan is a helpful prognostic tool. The findings on cardiac CT are expressed as a calcium score. Another name for this test is coronary artery calcium scoring.  What are some common uses of the procedure? The goal of cardiac CT scan for calcium scoring is to determine if CAD is present and to what extent, even if there are no symptoms. It is a screening study that may be recommended by a physician for patients with risk factors for CAD but no clinical symptoms. The major risk factors for CAD are: high blood cholesterol levels  family history of heart attacks  diabetes  high blood pressure  cigarette smoking  overweight or obese  physical inactivity   A negative cardiac CT scan for calcium scoring shows no calcification within the coronary arteries. This suggests that CAD is absent or so minimal it cannot be seen by this technique. The chance of having a heart attack over the next two to five years is very low under these circumstances. A positive test means that CAD is present, regardless of whether or not the patient is experiencing any symptoms. The amount of calcification--expressed as the calcium score--may help to predict the likelihood of a myocardial infarction (heart attack) in the coming years and helps your medical doctor or cardiologist decide whether the patient may need to take preventive medicine or undertake other measures such as diet and exercise to lower the risk for heart attack. The extent of CAD  is graded according to your calcium score:  Calcium Score  Presence of CAD (coronary artery disease)  0 No evidence of CAD   1-10 Minimal evidence of CAD  11-100 Mild evidence of CAD  101-400 Moderate evidence of CAD  Over 400 Extensive evidence of CAD   Coronary artery calcium (CAC) score is a strong predictor of incident coronary heart disease (CHD) and provides predictive information beyond traditional  risk factors. CAC scoring is reasonable to use in the decision to withhold, postpone, or initiate statin therapy in intermediate-risk or selected borderline-risk asymptomatic adults (age 65-75 years and LDL-C >=70 to <190 mg/dL) who do not have diabetes or established atherosclerotic cardiovascular disease (ASCVD).* In intermediate-risk (10-year ASCVD risk >=7.5% to <20%) adults or selected borderline-risk (10-year ASCVD risk >=5% to <7.5%) adults in whom a CAC score is measured for the purpose of making a treatment decision the following recommendations have been made:   If CAC=0, it is reasonable to withhold statin therapy and reassess in 5 to 10 years, as long as higher risk conditions are absent (diabetes mellitus, family history of premature CHD in first degree relatives (males <55 years; females <65 years), cigarette smoking, or LDL >=190 mg/dL).   If CAC is 1 to 99, it is reasonable to initiate statin therapy for patients >=42 years of age.   If CAC is >=100 or >=75th percentile, it is reasonable to initiate statin therapy at any age.   Cardiology referral should be considered for patients with CAC scores >=400 or >=75th percentile.   *2018 AHA/ACC/AACVPR/AAPA/ABC/ACPM/ADA/AGS/APhA/ASPC/NLA/PCNA Guideline on the Management of Blood Cholesterol: A Report of the American College of Cardiology/American Heart Association Task Force on Clinical Practice Guidelines. J Am Coll Cardiol. 2019;73(24):3168-3209.

## 2021-12-14 NOTE — Assessment & Plan Note (Addendum)
Refractory  - check on Wegovy, Saxenda coverage We can try Adipex po

## 2021-12-14 NOTE — Assessment & Plan Note (Signed)
Coreg, Spironolactone, Doxazosin, Losartan A cardiac CT scan for calcium scoring offered

## 2021-12-14 NOTE — Progress Notes (Signed)
Subjective:  Patient ID: Jennifer Gibson, female    DOB: 1951-08-14  Age: 71 y.o. MRN: [id number]  CC: No chief complaint on file.   HPI Jennifer Gibson presents for HTN, obesity, CRI f/u  Outpatient Medications Prior to Visit  Medication Sig Dispense Refill   carvedilol (COREG) 25 MG tablet Take 1 tablet (25 mg total) by mouth 2 (two) times daily with a meal. 180 tablet 3   Cetirizine HCl 10 MG CAPS Take by mouth.     Cholecalciferol (VITAMIN D3) 50 MCG (2000 UT) capsule Take 1 capsule (2,000 Units total) by mouth daily. 100 capsule 3   doxazosin (CARDURA) 8 MG tablet TAKE 1 TABLET DAILY (OFFICE VISIT IS DUE) 90 tablet 3   escitalopram (LEXAPRO) 5 MG tablet TAKE 1 TABLET DAILY 90 tablet 3   losartan (COZAAR) 25 MG tablet TAKE 1 TABLET TWICE A DAY 180 tablet 3   Multiple Vitamin (MULTIVITAMIN) capsule Take 1 capsule by mouth daily.     Omega-3 Fatty Acids (OMEGA 3 PO) Take 1 capsule by mouth daily. Mega red     pantoprazole (PROTONIX) 40 MG tablet Take 1 tablet (40 mg total) by mouth daily. 90 tablet 3   spironolactone-hydrochlorothiazide (ALDACTAZIDE) 50-50 MG tablet TAKE 1 TABLET DAILY 90 tablet 3   No facility-administered medications prior to visit.    ROS: Review of Systems  Constitutional:  Negative for activity change, appetite change, chills, fatigue and unexpected weight change.  HENT:  Negative for congestion, mouth sores and sinus pressure.   Eyes:  Negative for visual disturbance.  Respiratory:  Negative for cough and chest tightness.   Gastrointestinal:  Negative for abdominal pain and nausea.  Genitourinary:  Negative for difficulty urinating, frequency and vaginal pain.  Musculoskeletal:  Negative for back pain and gait problem.  Skin:  Negative for pallor and rash.  Neurological:  Negative for dizziness, tremors, weakness, numbness and headaches.  Psychiatric/Behavioral:  Negative for confusion, sleep disturbance and suicidal ideas.    Objective:  BP 128/74 (BP  Location: Right Arm, Patient Position: Sitting, Cuff Size: Large)    Pulse 78    Temp 98.1 F (36.7 C) (Oral)    Ht 5\' 5"  (1.651 m)    Wt 279 lb (126.6 kg)    SpO2 98%    BMI 46.43 kg/m   BP Readings from Last 3 Encounters:  12/14/21 128/74  06/07/21 124/80  11/30/20 122/70    Wt Readings from Last 3 Encounters:  12/14/21 279 lb (126.6 kg)  06/07/21 279 lb (126.6 kg)  11/30/20 278 lb 3.2 oz (126.2 kg)    Physical Exam Constitutional:      General: She is not in acute distress.    Appearance: She is well-developed. She is obese.  HENT:     Head: Normocephalic.     Right Ear: External ear normal.     Left Ear: External ear normal.     Nose: Nose normal.  Eyes:     General:        Right eye: No discharge.        Left eye: No discharge.     Conjunctiva/sclera: Conjunctivae normal.     Pupils: Pupils are equal, round, and reactive to light.  Neck:     Thyroid: No thyromegaly.     Vascular: No JVD.     Trachea: No tracheal deviation.  Cardiovascular:     Rate and Rhythm: Normal rate and regular rhythm.     Heart sounds:  Normal heart sounds.  Pulmonary:     Effort: No respiratory distress.     Breath sounds: No stridor. No wheezing.  Abdominal:     General: Bowel sounds are normal. There is no distension.     Palpations: Abdomen is soft. There is no mass.     Tenderness: There is no abdominal tenderness. There is no guarding or rebound.  Musculoskeletal:        General: No tenderness.     Cervical back: Normal range of motion and neck supple. No rigidity.  Lymphadenopathy:     Cervical: No cervical adenopathy.  Skin:    Findings: No erythema or rash.  Neurological:     Cranial Nerves: No cranial nerve deficit.     Motor: No abnormal muscle tone.     Coordination: Coordination normal.     Deep Tendon Reflexes: Reflexes normal.  Psychiatric:        Behavior: Behavior normal.        Thought Content: Thought content normal.        Judgment: Judgment normal.    Lab  Results  Component Value Date   WBC 5.5 06/07/2021   HGB 10.5 (L) 06/07/2021   HCT 33.1 (L) 06/07/2021   PLT 252.0 06/07/2021   GLUCOSE 97 06/07/2021   CHOL 187 06/07/2021   TRIG 70.0 06/07/2021   HDL 45.00 06/07/2021   LDLCALC 128 (H) 06/07/2021   ALT 10 06/07/2021   AST 15 06/07/2021   NA 139 06/07/2021   K 4.3 06/07/2021   CL 103 06/07/2021   CREATININE 1.26 (H) 06/07/2021   BUN 17 06/07/2021   CO2 28 06/07/2021   TSH 1.92 06/07/2021   HGBA1C 6.0 03/11/2014    US THYROID  Result Date: 12/18/2020 CLINICAL DATA:  Goiter.  Left thyroid nodule biopsy on 03/07/2010. EXAM: THYROID ULTRASOUND TECHNIQUE: Ultrasound examination of the thyroid gland and adjacent soft tissues was performed. COMPARISON:  02/23/2010 FINDINGS: Parenchymal Echotexture: Mildly heterogenous Isthmus: 0.3 cm, previously 0.2 cm Right lobe: 5.0 x 1.6 x 1.8 cm, previously 5.1 x 1.3 x 1.9 cm Left lobe: 6.4 x 3.2 x 2.8 cm, previously 5.4 x 2.2 x 2.5 cm _________________________________________________________ Estimated total number of nodules >/= 1 cm: 2 Number of spongiform nodules >/=  2 cm not described below (TR1): 0 Number of mixed cystic and solid nodules >/= 1.5 cm not described below ([place]): 0 _________________________________________________________ Nodule # 4: Location: Right; Inferior Maximum size: 0.8 cm; Other 2 dimensions: 0.8 x 0.8 cm Composition: cannot determine (2) Echogenicity: hypoechoic (2) Shape: not taller-than-wide (0) Margins: ill-defined (0) Echogenic foci: none (0) ACR TI-RADS total points: 4. ACR TI-RADS risk category: TR4 (4-6 points). ACR TI-RADS recommendations: Given size (<0.9 cm) and appearance, this nodule does NOT meet TI-RADS criteria for biopsy or dedicated follow-up. _________________________________________________________ Nodule #5 is a primarily cystic nodule in the left superior thyroid lobe that measures 1.0 x 0.6 x 1.1 cm. Nodule #6 is the previously biopsied nodule in the left  inferior thyroid nodule. This nodule measures 4.5 x 2.6 x 3.1 cm and previously measured 3.9 x 1.8 x 2.7 cm. This nodule is heterogeneous and slightly hypoechoic. Morphology has not changed since 2011. IMPRESSION: 1. Multinodular goiter. 2. Previously biopsied nodule in the left inferior thyroid lobe has slightly enlarged. Morphology of this nodule has not significantly changed since 2011. 3. No new suspicious thyroid nodules. The above is in keeping with the ACR TI-RADS recommendations - J Am Coll Radiol 2017;14:587-595. Electronically Signed  By: Markus Daft M.D.   On: 12/18/2020 15:30    Assessment & Plan:   Problem List Items Addressed This Visit     Anxiety disorder    Stable      Relevant Orders   CBC with Differential/Platelet   Comprehensive metabolic panel   TSH   Vitamin B12   Urinalysis   Chronic renal insufficiency, stage 3 (moderate) (HCC)    Monitor GFR Hydrate well      Relevant Orders   CBC with Differential/Platelet   Comprehensive metabolic panel   TSH   Vitamin B12   Urinalysis   Essential hypertension    Coreg, Spironolactone, Doxazosin, Losartan A cardiac CT scan for calcium scoring offered       Obesity    Refractory  - check on Wegovy, Saxenda coverage We can try Adipex po       Relevant Medications   phentermine (ADIPEX-P) 37.5 MG tablet   Other Relevant Orders   CBC with Differential/Platelet   Comprehensive metabolic panel   TSH   Vitamin B12   Urinalysis      Meds ordered this encounter  Medications   phentermine (ADIPEX-P) 37.5 MG tablet    Sig: Take 1 tablet (37.5 mg total) by mouth daily before breakfast.    Dispense:  30 tablet    Refill:  2      Follow-up: Return in about 3 months (around 03/13/2022) for a follow-up visit.  Walker Kehr, MD

## 2021-12-14 NOTE — Assessment & Plan Note (Signed)
Monitor GFR Hydrate well 

## 2021-12-14 NOTE — Assessment & Plan Note (Signed)
Stable

## 2021-12-17 ENCOUNTER — Encounter: Payer: Self-pay | Admitting: Internal Medicine

## 2021-12-17 ENCOUNTER — Other Ambulatory Visit: Payer: Self-pay | Admitting: Internal Medicine

## 2021-12-17 DIAGNOSIS — N183 Chronic kidney disease, stage 3 unspecified: Secondary | ICD-10-CM

## 2022-01-08 ENCOUNTER — Encounter: Payer: Self-pay | Admitting: Internal Medicine

## 2022-01-12 ENCOUNTER — Other Ambulatory Visit: Payer: Self-pay | Admitting: Internal Medicine

## 2022-01-12 MED ORDER — SPIRONOLACTONE-HCTZ 25-25 MG PO TABS: 2.0000 | ORAL_TABLET | Freq: Every day | ORAL | 3 refills | Status: DC

## 2022-01-18 DIAGNOSIS — I129 Hypertensive chronic kidney disease with stage 1 through stage 4 chronic kidney disease, or unspecified chronic kidney disease: Secondary | ICD-10-CM | POA: Diagnosis not present

## 2022-01-18 DIAGNOSIS — D631 Anemia in chronic kidney disease: Secondary | ICD-10-CM | POA: Diagnosis not present

## 2022-01-18 DIAGNOSIS — E669 Obesity, unspecified: Secondary | ICD-10-CM | POA: Diagnosis not present

## 2022-01-18 DIAGNOSIS — N1832 Chronic kidney disease, stage 3b: Secondary | ICD-10-CM | POA: Diagnosis not present

## 2022-03-19 DIAGNOSIS — N1832 Chronic kidney disease, stage 3b: Secondary | ICD-10-CM | POA: Diagnosis not present

## 2022-03-19 DIAGNOSIS — E669 Obesity, unspecified: Secondary | ICD-10-CM | POA: Diagnosis not present

## 2022-03-19 DIAGNOSIS — D631 Anemia in chronic kidney disease: Secondary | ICD-10-CM | POA: Diagnosis not present

## 2022-03-19 DIAGNOSIS — I129 Hypertensive chronic kidney disease with stage 1 through stage 4 chronic kidney disease, or unspecified chronic kidney disease: Secondary | ICD-10-CM | POA: Diagnosis not present

## 2022-03-19 DIAGNOSIS — N189 Chronic kidney disease, unspecified: Secondary | ICD-10-CM | POA: Diagnosis not present

## 2022-05-29 ENCOUNTER — Other Ambulatory Visit: Payer: Self-pay | Admitting: Internal Medicine

## 2022-06-10 ENCOUNTER — Ambulatory Visit (INDEPENDENT_AMBULATORY_CARE_PROVIDER_SITE_OTHER): Payer: Medicare Other

## 2022-06-10 DIAGNOSIS — Z1211 Encounter for screening for malignant neoplasm of colon: Secondary | ICD-10-CM | POA: Diagnosis not present

## 2022-06-10 DIAGNOSIS — Z0000 Encounter for general adult medical examination without abnormal findings: Secondary | ICD-10-CM | POA: Diagnosis not present

## 2022-06-10 NOTE — Progress Notes (Cosign Needed Addendum)
Subjective:   Jennifer Gibson is a 71 y.o. female who presents for Medicare Annual (Subsequent) preventive examination.   I connected with Kriste Basque  today by telephone and verified that I am speaking with the correct person using two identifiers. Location patient: home Location provider: work Persons participating in the virtual visit: patient, provider.   I discussed the limitations, risks, security and privacy concerns of performing an evaluation and management service by telephone and the availability of in person appointments. I also discussed with the patient that there may be a patient responsible charge related to this service. The patient expressed understanding and verbally consented to this telephonic visit.    Interactive audio and video telecommunications were attempted between this provider and patient, however failed, due to patient having technical difficulties OR patient did not have access to video capability.  We continued and completed visit with audio only.    Review of Systems     Cardiac Risk Factors include: advanced age (>58mn, >>89women)     Objective:    Today's Vitals   There is no height or weight on file to calculate BMI.     06/10/2022   10:53 AM 11/30/2020    1:11 PM 12/29/2017    4:10 PM 02/01/2016    2:09 PM 12/21/2015   12:07 PM  Advanced Directives  Does Patient Have a Medical Advance Directive? No No No No Yes  Copy of HRolfein Chart?     Yes  Would patient like information on creating a medical advance directive? No - Patient declined Yes (MAU/Ambulatory/Procedural Areas - Information given) Yes (ED - Information included in AVS)      Current Medications (verified) Outpatient Encounter Medications as of 06/10/2022  Medication Sig   carvedilol (COREG) 25 MG tablet Take 1 tablet (25 mg total) by mouth 2 (two) times daily with a meal.   Cetirizine HCl 10 MG CAPS Take by mouth.   Cholecalciferol (VITAMIN D3) 50 MCG (2000  UT) capsule Take 1 capsule (2,000 Units total) by mouth daily.   escitalopram (LEXAPRO) 5 MG tablet TAKE 1 TABLET DAILY   losartan (COZAAR) 25 MG tablet TAKE 1 TABLET TWICE A DAY   Multiple Vitamin (MULTIVITAMIN) capsule Take 1 capsule by mouth daily.   Omega-3 Fatty Acids (OMEGA 3 PO) Take 1 capsule by mouth daily. Mega red   pantoprazole (PROTONIX) 40 MG tablet Take 1 tablet (40 mg total) by mouth daily.   spironolactone-hydrochlorothiazide (ALDACTAZIDE) 25-25 MG tablet Take 2 tablets by mouth daily.   doxazosin (CARDURA) 8 MG tablet TAKE 1 TABLET DAILY (OFFICE VISIT IS DUE) (Patient not taking: Reported on 06/10/2022)   phentermine (ADIPEX-P) 37.5 MG tablet Take 1 tablet (37.5 mg total) by mouth daily before breakfast.   No facility-administered encounter medications on file as of 06/10/2022.    Allergies (verified) Grass pollen([id number]-swt vern) and Penicillins   History: Past Medical History:  Diagnosis Date   Allergy    Anemia    Anxiety    Detached retina, right 2009   Hypertension    Morbid obesity ([place]    Sleep apnea    doesnt wear   Venous insufficiency of leg    right foot / ankle -sev.yeras ago   Past Surgical History:  Procedure Laterality Date   ABDOMINAL HYSTERECTOMY     partial   COLONOSCOPY     POLYPECTOMY     Family History  Problem Relation Age of Onset   Heart disease  Father    Hypertension Other    Colon polyps Brother        benign   Colon cancer Neg Hx    Esophageal cancer Neg Hx    Pancreatic cancer Neg Hx    Rectal cancer Neg Hx    Stomach cancer Neg Hx    Social History   Socioeconomic History   Marital status: Widowed    Spouse name: Not on file   Number of children: Not on file   Years of education: Not on file   Highest education level: Not on file  Occupational History   Not on file  Tobacco Use   Smoking status: Never   Smokeless tobacco: Never   Tobacco comments:    alot of second hand smoke  Substance and Sexual Activity    Alcohol use: No    Alcohol/week: 0.0 standard drinks of alcohol   Drug use: No   Sexual activity: Not on file  Other Topics Concern   Not on file  Social History Narrative   Not on file   Social Determinants of Health   Financial Resource Strain: Low Risk  (06/10/2022)   Overall Financial Resource Strain (CARDIA)    Difficulty of Paying Living Expenses: Not hard at all  Food Insecurity: No Food Insecurity (06/10/2022)   Hunger Vital Sign    Worried About Running Out of Food in the Last Year: Never true    [place] in the Last Year: Never true  Transportation Needs: No Transportation Needs (06/10/2022)   PRAPARE - Hydrologist (Medical): No    Lack of Transportation (Non-Medical): No  Physical Activity: Insufficiently Active (06/10/2022)   Exercise Vital Sign    Days of Exercise per Week: 3 days    Minutes of Exercise per Session: 30 min  Stress: No Stress Concern Present (06/10/2022)   [place]    Feeling of Stress : Not at all  Social Connections: Moderately Integrated (06/10/2022)   Social Connection and Isolation Panel [NHANES]    Frequency of Communication with Friends and Family: Three times a week    Frequency of Social Gatherings with Friends and Family: Three times a week    Attends Religious Services: More than 4 times per year    Active Member of Clubs or Organizations: Yes    Attends Archivist Meetings: More than 4 times per year    Marital Status: Widowed    Tobacco Counseling Counseling given: Not Answered Tobacco comments: alot of second hand smoke   Clinical Intake:  Pre-visit preparation completed: Yes  Pain : No/denies pain     Nutritional Risks: None Diabetes: No  How often do you need to have someone help you when you read instructions, pamphlets, or other written materials from your doctor or pharmacy?: 1 - Never What is the last grade level  you completed in school?: college  Diabetic?no   Interpreter Needed?: No  Information entered by :: L.Wilson,LPN   Activities of Daily Living    06/10/2022   10:55 AM  In your present state of health, do you have any difficulty performing the following activities:  Hearing? 0  Vision? 0  Difficulty concentrating or making decisions? 0  Walking or climbing stairs? 0  Dressing or bathing? 0  Doing errands, shopping? 0  Preparing Food and eating ? N  Using the Toilet? N  In the past six months, have you  accidently leaked urine? N  Do you have problems with loss of bowel control? N  Managing your Medications? N  Managing your Finances? N  Housekeeping or managing your Housekeeping? N    Patient Care Team: Plotnikov, Evie Lacks, MD as PCP - General Louretta Shorten, MD as Registered Nurse (Obstetrics and Gynecology) Ladene Artist, MD (Gastroenterology)  Indicate any recent Medical Services you may have received from other than [facility] providers in the past year (date may be approximate).     Assessment:   This is a routine wellness examination for [place].  Hearing/Vision screen Vision Screening - Comments:: Annual ey exams wear glasses  Dietary issues and exercise activities discussed: Current Exercise Habits: The patient does not participate in regular exercise at present;Home exercise routine, Type of exercise: walking, Time (Minutes): 30, Frequency (Times/Week): 3, Weekly Exercise (Minutes/Week): 90, Intensity: Mild, Exercise limited by: None identified   Goals Addressed   None    Depression Screen    06/10/2022   10:54 AM 06/10/2022   10:52 AM 12/14/2021    8:40 AM 11/30/2020    1:09 PM 05/29/2020    2:17 PM 12/31/2018    1:12 PM 12/29/2017    4:10 PM  PHQ 2/9 Scores  PHQ - 2 Score 0 0 2 0 0 1 2  PHQ- 9 Score       3    Fall Risk    06/10/2022   10:54 AM 06/07/2021    9:04 AM 11/30/2020    1:11 PM 05/29/2020    2:17 PM 12/31/2018    1:11 PM  [place] in the  past year? 0 0 0 0 0  Number falls in past yr: 0 0 0 0   Injury with Fall? 0 0 0 0   Risk for fall due to :  No Fall Risks No Fall Risks    Follow up Falls evaluation completed;Education provided Falls evaluation completed   Falls evaluation completed    [place]:  Any stairs in or around the home? No  If so, are there any without handrails? No  Home free of loose throw rugs in walkways, pet beds, electrical cords, etc? Yes  Adequate lighting in your home to reduce risk of falls? Yes   ASSISTIVE DEVICES UTILIZED TO PREVENT FALLS:  Life alert? No  Use of a cane, walker or w/c? No  Grab bars in the bathroom? Yes  Shower chair or bench in shower? Yes  Elevated toilet seat or a handicapped toilet? Yes    Cognitive Function:  Normal cognitive status assessed by telephone conversation  by this Nurse Health Advisor. No abnormalities found.        06/10/2022   11:02 AM  6CIT Screen  What Year? 0 points  What month? 0 points  What time? 0 points  Count back from 20 0 points  Months in reverse 0 points  Repeat phrase 0 points  Total Score 0 points    Immunizations Immunization History  Administered Date(s) Administered   Fluad Quad(high Dose 65+) 07/01/2019   Influenza, High Dose Seasonal PF 12/31/2018, 08/31/2020   Influenza,inj,Quad PF,6+ Mos 08/02/2014, 08/01/2015   Influenza-Unspecified 10/30/2016, 08/29/2017   PFIZER(Purple Top)SARS-COV-2 Vaccination 12/17/2019, 01/11/2020, 10/18/2020   Pneumococcal Conjugate-13 08/02/2014, 08/29/2017   Pneumococcal Polysaccharide-23 09/01/2014   Td 11/06/1992, 06/30/2012   Zoster, Live 10/31/2014    TDAP status: Up to date  Flu Vaccine status: Up to date  Pneumococcal vaccine status:  Up to date  Covid-19 vaccine status: Completed vaccines  Qualifies for Shingles Vaccine? Yes   Zostavax completed No   Shingrix Completed?: No.    Education has been provided regarding the importance of this  vaccine. Patient has been advised to call insurance company to determine out of pocket expense if they have not yet received this vaccine. Advised may also receive vaccine at local pharmacy or Health Dept. Verbalized acceptance and understanding.  Screening Tests Health Maintenance  Topic Date Due   Zoster Vaccines- Shingrix (1 of 2) Never done   COVID-19 Vaccine (5 - Booster) 12/13/2020   COLONOSCOPY (Pts 45-61yr Insurance coverage will need to be confirmed)  02/14/2021   INFLUENZA VACCINE  06/04/2022   MAMMOGRAM  07/06/2022   TETANUS/TDAP  04/04/2029   Pneumonia Vaccine 71 Years old  Completed   DEXA SCAN  Completed   Hepatitis C Screening  Completed   HPV VACCINES  Aged Out    Health Maintenance  Health Maintenance Due  Topic Date Due   Zoster Vaccines- Shingrix (1 of 2) Never done   COVID-19 Vaccine (5 - Booster) 12/13/2020   COLONOSCOPY (Pts 45-457yrInsurance coverage will need to be confirmed)  02/14/2021   INFLUENZA VACCINE  06/04/2022    Colorectal cancer screening: Referral to GI placed 06/10/2022. Pt aware the office will call re: appt.  Mammogram status: Completed 07/06/2021. Repeat every year  Bone Density status: Completed 04/05/2019. Results reflect: Bone density results: OSTEOPENIA. Repeat every 5 years.  Lung Cancer Screening: (Low Dose CT Chest recommended if Age 73-80ears, 30 pack-year currently smoking OR have quit w/in 15years.) does not qualify.   Lung Cancer Screening Referral: n/a  Additional Screening:  Hepatitis C Screening: does not qualify;   Vision Screening: Recommended annual ophthalmology exams for early detection of glaucoma and other disorders of the eye. Is the patient up to date with their annual eye exam?  Yes  Who is the provider or what is the name of the office in which the patient attends annual eye exams? Dr,Miller  If pt is not established with a provider, would they like to be referred to a provider to establish care? No .    Dental Screening: Recommended annual dental exams for proper oral hygiene  Community Resource Referral / Chronic Care Management: CRR required this visit?  No   CCM required this visit?  No      Plan:     I have personally reviewed and noted the following in the patient's chart:   Medical and social history Use of alcohol, tobacco or illicit drugs  Current medications and supplements including opioid prescriptions.  Functional ability and status Nutritional status Physical activity Advanced directives List of other physicians Hospitalizations, surgeries, and ER visits in previous 12 months Vitals Screenings to include cognitive, depression, and falls Referrals and appointments  In addition, I have reviewed and discussed with patient certain preventive protocols, quality metrics, and best practice recommendations. A written personalized care plan for preventive services as well as general preventive health recommendations were provided to patient.     LaDaphane ShepherdLPN   8/03/09/2129 Nurse Notes: none     Medical screening examination/treatment/procedure(s) were performed by non-physician practitioner and as supervising physician I was immediately available for consultation/collaboration.  I agree with above. AlLew DawesMD

## 2022-06-10 NOTE — Patient Instructions (Signed)
Jennifer Gibson , Thank you for taking time to come for your Medicare Wellness Visit. I appreciate your ongoing commitment to your health goals. Please review the following plan we discussed and let me know if I can assist you in the future.   Screening recommendations/referrals: Colonoscopy: referral 06/10/2022 Recommended yearly ophthalmology/optometry visit for glaucoma screening and checkup Recommended yearly dental visit for hygiene and checkup  Vaccinations: Influenza vaccine: completed  Pneumococcal vaccine: completed  Tdap vaccine: 06/30/2012 Shingles vaccine: will consider     Advanced directives: none   Conditions/risks identified: none   Next appointment: none   Preventive Care 39 Years and Older, Female Preventive care refers to lifestyle choices and visits with your health care provider that can promote health and wellness. What does preventive care include? A yearly physical exam. This is also called an annual well check. Dental exams once or twice a year. Routine eye exams. Ask your health care provider how often you should have your eyes checked. Personal lifestyle choices, including: Daily care of your teeth and gums. Regular physical activity. Eating a healthy diet. Avoiding tobacco and drug use. Limiting alcohol use. Practicing safe sex. Taking low doses of aspirin every day. Taking vitamin and mineral supplements as recommended by your health care provider. What happens during an annual well check? The services and screenings done by your health care provider during your annual well check will depend on your age, overall health, lifestyle risk factors, and family history of disease. Counseling  Your health care provider may ask you questions about your: Alcohol use. Tobacco use. Drug use. Emotional well-being. Home and relationship well-being. Sexual activity. Eating habits. History of falls. Memory and ability to understand (cognition). Work and work  Statistician. Screening  You may have the following tests or measurements: Height, weight, and BMI. Blood pressure. Lipid and cholesterol levels. These may be checked every 5 years, or more frequently if you are over 58 years old. Skin check. Lung cancer screening. You may have this screening every year starting at age 74 if you have a 30-pack-year history of smoking and currently smoke or have quit within the past 15 years. Fecal occult blood test (FOBT) of the stool. You may have this test every year starting at age 65. Flexible sigmoidoscopy or colonoscopy. You may have a sigmoidoscopy every 5 years or a colonoscopy every 10 years starting at age 87. Prostate cancer screening. Recommendations will vary depending on your family history and other risks. Hepatitis C blood test. Hepatitis B blood test. Sexually transmitted disease (STD) testing. Diabetes screening. This is done by checking your blood sugar (glucose) after you have not eaten for a while (fasting). You may have this done every 1-3 years. Abdominal aortic aneurysm (AAA) screening. You may need this if you are a current or former smoker. Osteoporosis. You may be screened starting at age 40 if you are at high risk. Talk with your health care provider about your test results, treatment options, and if necessary, the need for more tests. Vaccines  Your health care provider may recommend certain vaccines, such as: Influenza vaccine. This is recommended every year. Tetanus, diphtheria, and acellular pertussis (Tdap, Td) vaccine. You may need a Td booster every 10 years. Zoster vaccine. You may need this after age 20. Pneumococcal 13-valent conjugate (PCV13) vaccine. One dose is recommended after age 30. Pneumococcal polysaccharide (PPSV23) vaccine. One dose is recommended after age 31. Talk to your health care provider about which screenings and vaccines you need and how often  you need them. This information is not intended to replace  advice given to you by your health care provider. Make sure you discuss any questions you have with your health care provider. Document Released: 11/17/2015 Document Revised: 07/10/2016 Document Reviewed: 08/22/2015 Elsevier Interactive Patient Education  2017 [place] Prevention in the Home Falls can cause injuries. They can happen to people of all ages. There are many things you can do to make your home safe and to help prevent falls. What can I do on the outside of my home? Regularly fix the edges of walkways and driveways and fix any cracks. Remove anything that might make you trip as you walk through a door, such as a raised step or threshold. Trim any bushes or trees on the path to your home. Use bright outdoor lighting. Clear any walking paths of anything that might make someone trip, such as rocks or tools. Regularly check to see if handrails are loose or broken. Make sure that both sides of any steps have handrails. Any raised decks and porches should have guardrails on the edges. Have any leaves, snow, or ice cleared regularly. Use sand or salt on walking paths during winter. Clean up any spills in your garage right away. This includes oil or grease spills. What can I do in the bathroom? Use night lights. Install grab bars by the toilet and in the tub and shower. Do not use towel bars as grab bars. Use non-skid mats or decals in the tub or shower. If you need to sit down in the shower, use a plastic, non-slip stool. Keep the floor dry. Clean up any water that spills on the floor as soon as it happens. Remove soap buildup in the tub or shower regularly. Attach bath mats securely with double-sided non-slip rug tape. Do not have throw rugs and other things on the floor that can make you trip. What can I do in the bedroom? Use night lights. Make sure that you have a light by your bed that is easy to reach. Do not use any sheets or blankets that are too big for your bed.  They should not hang down onto the floor. Have a firm chair that has side arms. You can use this for support while you get dressed. Do not have throw rugs and other things on the floor that can make you trip. What can I do in the kitchen? Clean up any spills right away. Avoid walking on wet floors. Keep items that you use a lot in easy-to-reach places. If you need to reach something above you, use a strong step stool that has a grab bar. Keep electrical cords out of the way. Do not use floor polish or wax that makes floors slippery. If you must use wax, use non-skid floor wax. Do not have throw rugs and other things on the floor that can make you trip. What can I do with my stairs? Do not leave any items on the stairs. Make sure that there are handrails on both sides of the stairs and use them. Fix handrails that are broken or loose. Make sure that handrails are as long as the stairways. Check any carpeting to make sure that it is firmly attached to the stairs. Fix any carpet that is loose or worn. Avoid having throw rugs at the top or bottom of the stairs. If you do have throw rugs, attach them to the floor with carpet tape. Make sure that you have a light  switch at the top of the stairs and the bottom of the stairs. If you do not have them, ask someone to add them for you. What else can I do to help prevent falls? Wear shoes that: Do not have high heels. Have rubber bottoms. Are comfortable and fit you well. Are closed at the toe. Do not wear sandals. If you use a stepladder: Make sure that it is fully opened. Do not climb a closed stepladder. Make sure that both sides of the stepladder are locked into place. Ask someone to hold it for you, if possible. Clearly mark and make sure that you can see: Any grab bars or handrails. First and last steps. Where the edge of each step is. Use tools that help you move around (mobility aids) if they are needed. These  include: Canes. Walkers. Scooters. Crutches. Turn on the lights when you go into a dark area. Replace any light bulbs as soon as they burn out. Set up your furniture so you have a clear path. Avoid moving your furniture around. If any of your floors are uneven, fix them. If there are any pets around you, be aware of where they are. Review your medicines with your doctor. Some medicines can make you feel dizzy. This can increase your chance of falling. Ask your doctor what other things that you can do to help prevent falls. This information is not intended to replace advice given to you by your health care provider. Make sure you discuss any questions you have with your health care provider. Document Released: 08/17/2009 Document Revised: 03/28/2016 Document Reviewed: 11/25/2014 Elsevier Interactive Patient Education  2017 [employer].

## 2022-06-11 ENCOUNTER — Encounter: Payer: Self-pay | Admitting: Internal Medicine

## 2022-06-17 ENCOUNTER — Encounter: Payer: Self-pay | Admitting: Gastroenterology

## 2022-06-18 DIAGNOSIS — I129 Hypertensive chronic kidney disease with stage 1 through stage 4 chronic kidney disease, or unspecified chronic kidney disease: Secondary | ICD-10-CM | POA: Diagnosis not present

## 2022-06-18 DIAGNOSIS — E669 Obesity, unspecified: Secondary | ICD-10-CM | POA: Diagnosis not present

## 2022-06-18 DIAGNOSIS — D631 Anemia in chronic kidney disease: Secondary | ICD-10-CM | POA: Diagnosis not present

## 2022-06-18 DIAGNOSIS — N1832 Chronic kidney disease, stage 3b: Secondary | ICD-10-CM | POA: Diagnosis not present

## 2022-06-18 LAB — BASIC METABOLIC PANEL
BUN: 20 (ref 4–21)
CO2: 25 — AB (ref 13–22)
Chloride: 101 (ref 99–108)
Creatinine: 1.5 — AB (ref 0.5–1.1)
Glucose: 84
Potassium: 4.8 mEq/L (ref 3.5–5.1)
Sodium: 139 (ref 137–147)

## 2022-06-18 LAB — COMPREHENSIVE METABOLIC PANEL
Calcium: 10.1 (ref 8.7–10.7)
eGFR: 38

## 2022-06-18 LAB — CBC AND DIFFERENTIAL: Hemoglobin: 10.2 — AB (ref 12.0–16.0)

## 2022-06-27 ENCOUNTER — Encounter: Payer: Self-pay | Admitting: Internal Medicine

## 2022-06-28 ENCOUNTER — Other Ambulatory Visit: Payer: Self-pay | Admitting: Internal Medicine

## 2022-07-05 ENCOUNTER — Encounter: Payer: Self-pay | Admitting: Internal Medicine

## 2022-07-05 DIAGNOSIS — Z1231 Encounter for screening mammogram for malignant neoplasm of breast: Secondary | ICD-10-CM | POA: Diagnosis not present

## 2022-07-05 DIAGNOSIS — Z01419 Encounter for gynecological examination (general) (routine) without abnormal findings: Secondary | ICD-10-CM | POA: Diagnosis not present

## 2022-07-05 DIAGNOSIS — Z6841 Body Mass Index (BMI) 40.0 and over, adult: Secondary | ICD-10-CM | POA: Diagnosis not present

## 2022-07-18 ENCOUNTER — Ambulatory Visit (AMBULATORY_SURGERY_CENTER): Payer: Self-pay | Admitting: *Deleted

## 2022-07-18 VITALS — Ht 65.0 in | Wt 273.6 lb

## 2022-07-18 DIAGNOSIS — Z86010 Personal history of colonic polyps: Secondary | ICD-10-CM

## 2022-07-18 MED ORDER — PLENVU 140 G PO SOLR: 1.0000 | Freq: Once | ORAL | 0 refills | Status: AC

## 2022-07-18 NOTE — Progress Notes (Signed)
  No trouble with anesthesia, denies being told they were difficult to intubate, or hx/fam hx of malignant hyperthermia per pt   No egg or soy allergy  No home oxygen use   No medications for weight loss taken  Pt denies constipation issues  Pt informed that we do not do prior authorizations for prep  [employer] Medicare coupon given to pt

## 2022-08-06 ENCOUNTER — Other Ambulatory Visit: Payer: Self-pay | Admitting: Internal Medicine

## 2022-08-08 ENCOUNTER — Encounter: Payer: Self-pay | Admitting: Gastroenterology

## 2022-08-08 ENCOUNTER — Ambulatory Visit (AMBULATORY_SURGERY_CENTER): Payer: Medicare Other | Admitting: Gastroenterology

## 2022-08-08 VITALS — BP 95/48 | HR 61 | Temp 97.5°F | Resp 11 | Ht 65.0 in | Wt 273.6 lb

## 2022-08-08 DIAGNOSIS — Z09 Encounter for follow-up examination after completed treatment for conditions other than malignant neoplasm: Secondary | ICD-10-CM | POA: Diagnosis not present

## 2022-08-08 DIAGNOSIS — Z86010 Personal history of colonic polyps: Secondary | ICD-10-CM | POA: Diagnosis not present

## 2022-08-08 DIAGNOSIS — D125 Benign neoplasm of sigmoid colon: Secondary | ICD-10-CM | POA: Diagnosis not present

## 2022-08-08 DIAGNOSIS — D123 Benign neoplasm of transverse colon: Secondary | ICD-10-CM

## 2022-08-08 DIAGNOSIS — D120 Benign neoplasm of cecum: Secondary | ICD-10-CM | POA: Diagnosis not present

## 2022-08-08 DIAGNOSIS — F419 Anxiety disorder, unspecified: Secondary | ICD-10-CM | POA: Diagnosis not present

## 2022-08-08 DIAGNOSIS — G4730 Sleep apnea, unspecified: Secondary | ICD-10-CM | POA: Diagnosis not present

## 2022-08-08 DIAGNOSIS — D124 Benign neoplasm of descending colon: Secondary | ICD-10-CM

## 2022-08-08 DIAGNOSIS — N183 Chronic kidney disease, stage 3 unspecified: Secondary | ICD-10-CM | POA: Diagnosis not present

## 2022-08-08 MED ORDER — SODIUM CHLORIDE 0.9 % IV SOLN: 500.0000 mL | Freq: Once | INTRAVENOUS | Status: DC

## 2022-08-08 NOTE — Patient Instructions (Signed)
Information on polyps and diverticulosis given to you today.  Await pathology results.  Resume previous diet and medications.   YOU HAD AN ENDOSCOPIC PROCEDURE TODAY AT THE [facility]:   Refer to the procedure report that was given to you for any specific questions about what was found during the examination.  If the procedure report does not answer your questions, please call your gastroenterologist to clarify.  If you requested that your care partner not be given the details of your procedure findings, then the procedure report has been included in a sealed envelope for you to review at your convenience later.  YOU SHOULD EXPECT: Some feelings of bloating in the abdomen. Passage of more gas than usual.  Walking can help get rid of the air that was put into your GI tract during the procedure and reduce the bloating. If you had a lower endoscopy (such as a colonoscopy or flexible sigmoidoscopy) you may notice spotting of blood in your stool or on the toilet paper. If you underwent a bowel prep for your procedure, you may not have a normal bowel movement for a few days.  Please Note:  You might notice some irritation and congestion in your nose or some drainage.  This is from the oxygen used during your procedure.  There is no need for concern and it should clear up in a day or so.  SYMPTOMS TO REPORT IMMEDIATELY:  Following lower endoscopy (colonoscopy or flexible sigmoidoscopy):  Excessive amounts of blood in the stool  Significant tenderness or worsening of abdominal pains  Swelling of the abdomen that is new, acute  Fever of 100F or higher  For urgent or emergent issues, a gastroenterologist can be reached at any hour by calling [phone]. Do not use MyChart messaging for urgent concerns.    DIET:  We do recommend a small meal at first, but then you may proceed to your regular diet.  Drink plenty of fluids but you should avoid alcoholic beverages for 24  hours.  ACTIVITY:  You should plan to take it easy for the rest of today and you should NOT DRIVE or use heavy machinery until tomorrow (because of the sedation medicines used during the test).    FOLLOW UP: Our staff will call the number listed on your records the next business day following your procedure.  We will call around 7:15- 8:00 am to check on you and address any questions or concerns that you may have regarding the information given to you following your procedure. If we do not reach you, we will leave a message.     If any biopsies were taken you will be contacted by phone or by letter within the next 1-3 weeks.  Please call us at [phone] if you have not heard about the biopsies in 3 weeks.    SIGNATURES/CONFIDENTIALITY: You and/or your care partner have signed paperwork which will be entered into your electronic medical record.  These signatures attest to the fact that that the information above on your After Visit Summary has been reviewed and is understood.  Full responsibility of the confidentiality of this discharge information lies with you and/or your care-partner. 

## 2022-08-08 NOTE — Progress Notes (Signed)
A and O x3. Report to RN. Tolerated MAC anesthesia well. 

## 2022-08-08 NOTE — Progress Notes (Signed)
History & Physical  Primary Care Physician:  Cassandria Anger, MD Primary Gastroenterologist: Lucio Edward, MD  CHIEF COMPLAINT:  Personal history of colon polyps   HPI: Jennifer Gibson is a 71 y.o. female with a personal history of adenomatous colon polyps for surveillance colonoscopy.   Past Medical History:  Diagnosis Date   Allergy    Anemia    Anxiety    Cataract    Chronic kidney disease    stage 3   Detached retina, right 11/05/2007   Hypertension    Morbid obesity ([place])    Sleep apnea    doesn't wear CPAP   Venous insufficiency of leg    right foot / ankle -sev.yeras ago    Past Surgical History:  Procedure Laterality Date   ABDOMINAL HYSTERECTOMY     partial   COLONOSCOPY     CYSTECTOMY     left shoulder cyst   POLYPECTOMY      Prior to Admission medications   Medication Sig Start Date End Date Taking? Authorizing Provider  Apoaequorin (PREVAGEN PO) Take by mouth daily.   Yes [provider]  carvedilol (COREG) 25 MG tablet Take 1 tablet (25 mg total) by mouth 2 (two) times daily with a meal. Annual appt IS due must see provider for future refills 08/06/22  Yes Plotnikov, Evie Lacks, MD  Cetirizine HCl 10 MG CAPS Take by mouth daily.   Yes [provider]  Cholecalciferol (VITAMIN D3) 50 MCG (2000 UT) capsule Take 1 capsule (2,000 Units total) by mouth daily. 11/30/20  Yes Plotnikov, Evie Lacks, MD  escitalopram (LEXAPRO) 5 MG tablet TAKE 1 TABLET DAILY 05/29/22  Yes Plotnikov, Evie Lacks, MD  losartan (COZAAR) 25 MG tablet TAKE 1 TABLET TWICE A DAY 06/26/21  Yes Plotnikov, Evie Lacks, MD  Multiple Vitamin (MULTIVITAMIN) capsule Take 1 capsule by mouth daily.   Yes [provider]  Omega-3 Fatty Acids (OMEGA 3 PO) Take 1 capsule by mouth daily. Mega red   Yes [provider]  pantoprazole (PROTONIX) 40 MG tablet Take 1 tablet (40 mg total) by mouth daily. Annual appt due August must see provider for future refills 07/01/22  Yes  Plotnikov, Evie Lacks, MD  spironolactone-hydrochlorothiazide (ALDACTAZIDE) 25-25 MG tablet Take 2 tablets by mouth daily. 01/12/22  Yes Plotnikov, Evie Lacks, MD  doxazosin (CARDURA) 8 MG tablet TAKE 1 TABLET DAILY (OFFICE VISIT IS DUE) Patient not taking: Reported on 06/10/2022 06/11/21   Plotnikov, Evie Lacks, MD  phentermine (ADIPEX-P) 37.5 MG tablet Take 1 tablet (37.5 mg total) by mouth daily before breakfast. 12/14/21 01/13/22  Plotnikov, Evie Lacks, MD    Current Outpatient Medications  Medication Sig Dispense Refill   Apoaequorin (PREVAGEN PO) Take by mouth daily.     carvedilol (COREG) 25 MG tablet Take 1 tablet (25 mg total) by mouth 2 (two) times daily with a meal. Annual appt IS due must see provider for future refills 180 tablet 0   Cetirizine HCl 10 MG CAPS Take by mouth daily.     Cholecalciferol (VITAMIN D3) 50 MCG (2000 UT) capsule Take 1 capsule (2,000 Units total) by mouth daily. 100 capsule 3   escitalopram (LEXAPRO) 5 MG tablet TAKE 1 TABLET DAILY 90 tablet 2   losartan (COZAAR) 25 MG tablet TAKE 1 TABLET TWICE A DAY 180 tablet 3   Multiple Vitamin (MULTIVITAMIN) capsule Take 1 capsule by mouth daily.     Omega-3 Fatty Acids (OMEGA 3 PO) Take 1 capsule by mouth  daily. Mega red     pantoprazole (PROTONIX) 40 MG tablet Take 1 tablet (40 mg total) by mouth daily. Annual appt due August must see provider for future refills 90 tablet 0   spironolactone-hydrochlorothiazide (ALDACTAZIDE) 25-25 MG tablet Take 2 tablets by mouth daily. 180 tablet 3   doxazosin (CARDURA) 8 MG tablet TAKE 1 TABLET DAILY (OFFICE VISIT IS DUE) (Patient not taking: Reported on 06/10/2022) 90 tablet 3   phentermine (ADIPEX-P) 37.5 MG tablet Take 1 tablet (37.5 mg total) by mouth daily before breakfast. 30 tablet 2   Current Facility-Administered Medications  Medication Dose Route Frequency Provider Last Rate Last Admin   0.9 %  sodium chloride infusion  500 mL Intravenous Once Ladene Artist, MD         Allergies as of 08/08/2022 - Review Complete 08/08/2022  Allergen Reaction Noted   Grass pollen([id number]-swt vern)  05/29/2020   Penicillins  09/24/2010    Family History  Problem Relation Age of Onset   Heart disease Father    Hypertension Other    Colon polyps Brother        benign   Colon cancer Neg Hx    Esophageal cancer Neg Hx    Pancreatic cancer Neg Hx    Rectal cancer Neg Hx    Stomach cancer Neg Hx     Social History   Socioeconomic History   Marital status: Widowed    Spouse name: Not on file   Number of children: Not on file   Years of education: Not on file   Highest education level: Not on file  Occupational History   Not on file  Tobacco Use   Smoking status: Never   Smokeless tobacco: Never   Tobacco comments:    alot of second hand smoke  Vaping Use   Vaping Use: Never used  Substance and Sexual Activity   Alcohol use: No    Alcohol/week: 0.0 standard drinks of alcohol   Drug use: No   Sexual activity: Not on file  Other Topics Concern   Not on file  Social History Narrative   Not on file   Social Determinants of Health   Financial Resource Strain: Low Risk  (06/10/2022)   Overall Financial Resource Strain (CARDIA)    Difficulty of Paying Living Expenses: Not hard at all  Food Insecurity: No Food Insecurity (06/10/2022)   Hunger Vital Sign    Worried About Running Out of Food in the Last Year: Never true    [place] in the Last Year: Never true  Transportation Needs: No Transportation Needs (06/10/2022)   PRAPARE - Hydrologist (Medical): No    Lack of Transportation (Non-Medical): No  Physical Activity: Insufficiently Active (06/10/2022)   Exercise Vital Sign    Days of Exercise per Week: 3 days    Minutes of Exercise per Session: 30 min  Stress: No Stress Concern Present (06/10/2022)   [place]    Feeling of Stress : Not at all  Social  Connections: Moderately Integrated (06/10/2022)   Social Connection and Isolation Panel [NHANES]    Frequency of Communication with Friends and Family: Three times a week    Frequency of Social Gatherings with Friends and Family: Three times a week    Attends Religious Services: More than 4 times per year    Active Member of Clubs or Organizations: Yes    Attends Archivist  Meetings: More than 4 times per year    Marital Status: Widowed  Intimate Partner Violence: Not At Risk (06/10/2022)   Humiliation, Afraid, Rape, and Kick questionnaire    Fear of Current or Ex-Partner: No    Emotionally Abused: No    Physically Abused: No    Sexually Abused: No    Review of Systems:  All systems reviewed were negative except where noted in HPI.   Physical Exam: General:  Alert, well-developed, in NAD Head:  Normocephalic and atraumatic. Eyes:  Sclera clear, no icterus.   Conjunctiva pink. Ears:  Normal auditory acuity. Mouth:  No deformity or lesions.  Neck:  Supple; no masses . Lungs:  Clear throughout to auscultation.   No wheezes, crackles, or rhonchi. No acute distress. Heart:  Regular rate and rhythm; no murmurs. Abdomen:  Soft, nondistended, nontender. No masses, hepatomegaly. No obvious masses.  Normal bowel .    Rectal:  Deferred   Msk:  Symmetrical without gross deformities.. Pulses:  Normal pulses noted. Extremities:  Without edema. Neurologic:  Alert and  oriented x4;  grossly normal neurologically. Skin:  Intact without significant lesions or rashes. Cervical Nodes:  No significant cervical adenopathy. Psych:  Alert and cooperative. Normal mood and affect.   Impression / Plan:   Personal history of adenomatous colon polyps for surveillance colonoscopy.  Pricilla Riffle. Fuller Plan  08/08/2022, 9:29 AM See Shea Evans, [facility], to contact our on call provider

## 2022-08-08 NOTE — Progress Notes (Signed)
Called to room to assist during endoscopic procedure.  Patient ID and intended procedure confirmed with present staff. Received instructions for my participation in the procedure from the performing physician.  

## 2022-08-08 NOTE — Op Note (Addendum)
[facility] Patient Name: Jennifer Gibson Procedure Date: 08/08/2022 9:34 AM MRN: [id number] Endoscopist: Ladene Artist , MD Age: 71 Referring MD:  Date of Birth: [birth year] Gender: Female Account #: [id number] Procedure:                Colonoscopy Indications:              Surveillance: Personal history of adenomatous                            polyps on last colonoscopy > 5 years ago Medicines:                Monitored Anesthesia Care Procedure:                Pre-Anesthesia Assessment:                           - Prior to the procedure, a History and Physical                            was performed, and patient medications and                            allergies were reviewed. The patient's tolerance of                            previous anesthesia was also reviewed. The risks                            and benefits of the procedure and the sedation                            options and risks were discussed with the patient.                            All questions were answered, and informed consent                            was obtained. Prior Anticoagulants: The patient has                            taken no previous anticoagulant or antiplatelet                            agents. ASA Grade Assessment: III - A patient with                            severe systemic disease. After reviewing the risks                            and benefits, the patient was deemed in                            satisfactory condition to undergo the procedure.  After obtaining informed consent, the colonoscope                            was passed under direct vision. Throughout the                            procedure, the patient's blood pressure, pulse, and                            oxygen saturations were monitored continuously. The                            Colonoscope was introduced through the anus and                            advanced to the the  cecum, identified by                            appendiceal orifice and ileocecal valve. The                            ileocecal valve, appendiceal orifice, and rectum                            were photographed. The quality of the bowel                            preparation was good. The colonoscopy was performed                            without difficulty. The patient tolerated the                            procedure well. Scope In: 9:41:04 AM Scope Out: 9:57:34 AM Scope Withdrawal Time: 0 hours 12 minutes 53 seconds  Total Procedure Duration: 0 hours 16 minutes 30 seconds  Findings:                 The perianal and digital rectal examinations were                            normal.                           A 4 mm polyp was found in the cecum. The polyp was                            sessile. The polyp was removed with a cold biopsy                            forceps. Resection and retrieval were complete.                           A few small-mouthed diverticula were found in the  sigmoid colon, transverse colon and ascending colon.                           There was a small lipoma, 10 mm in diameter, in the                            transverse colon.                           Four sessile polyps were found in the sigmoid                            colon, descending colon and transverse colon (2).                            The polyps were 5 to 7 mm in size. These polyps                            were removed with a cold snare. Resection and                            retrieval were complete.                           The exam was otherwise without abnormality on                            direct and retroflexion views. Complications:            No immediate complications. Estimated blood loss:                            None. Estimated Blood Loss:     Estimated blood loss: none. Impression:               - One 4 mm polyp in the cecum, removed with  a cold                            biopsy forceps. Resected and retrieved.                           - Mild diverticulosis.                           - Small transverse colon lipoma.                           - Four 5 to 7 mm polyps in the sigmoid colon, in                            the descending colon and in the transverse colon,                            removed with a cold snare. Resected and retrieved.                           -  The examination was otherwise normal on direct                            and retroflexion views. Recommendation:           - Repeat colonoscopy vs no repeat due to age after                            studies are complete for surveillance based on                            pathology results.                           - Patient has a contact number available for                            emergencies. The signs and symptoms of potential                            delayed complications were discussed with the                            patient. Return to normal activities tomorrow.                            Written discharge instructions were provided to the                            patient.                           - High fiber diet.                           - Continue present medications.                           - Await pathology results. Ladene Artist, MD 08/08/2022 10:04:35 AM This report has been signed electronically.

## 2022-08-08 NOTE — Progress Notes (Signed)
Pt's states no medical or surgical changes since previsit or office visit. 

## 2022-08-09 ENCOUNTER — Telehealth: Payer: Self-pay

## 2022-08-09 NOTE — Telephone Encounter (Signed)
Left message on answering machine. 

## 2022-08-12 ENCOUNTER — Other Ambulatory Visit: Payer: Self-pay | Admitting: Internal Medicine

## 2022-08-27 ENCOUNTER — Encounter: Payer: Self-pay | Admitting: Gastroenterology

## 2022-09-14 DIAGNOSIS — Z23 Encounter for immunization: Secondary | ICD-10-CM | POA: Diagnosis not present

## 2022-09-18 ENCOUNTER — Other Ambulatory Visit: Payer: Self-pay | Admitting: Internal Medicine

## 2022-10-11 ENCOUNTER — Other Ambulatory Visit: Payer: Self-pay | Admitting: Internal Medicine

## 2022-10-12 ENCOUNTER — Encounter: Payer: Self-pay | Admitting: Internal Medicine

## 2022-11-06 ENCOUNTER — Encounter: Payer: Self-pay | Admitting: Internal Medicine

## 2022-11-06 ENCOUNTER — Ambulatory Visit: Payer: Medicare Other | Admitting: Internal Medicine

## 2022-11-07 ENCOUNTER — Encounter: Payer: Self-pay | Admitting: Emergency Medicine

## 2022-11-07 ENCOUNTER — Ambulatory Visit (INDEPENDENT_AMBULATORY_CARE_PROVIDER_SITE_OTHER): Payer: Medicare Other | Admitting: Emergency Medicine

## 2022-11-07 VITALS — BP 128/70 | HR 70 | Temp 98.2°F | Ht 65.0 in | Wt 278.4 lb

## 2022-11-07 DIAGNOSIS — U071 COVID-19: Secondary | ICD-10-CM | POA: Diagnosis not present

## 2022-11-07 DIAGNOSIS — I10 Essential (primary) hypertension: Secondary | ICD-10-CM

## 2022-11-07 DIAGNOSIS — R051 Acute cough: Secondary | ICD-10-CM | POA: Insufficient documentation

## 2022-11-07 MED ORDER — HYDROCODONE BIT-HOMATROP MBR 5-1.5 MG/5ML PO SOLN: 5.0000 mL | Freq: Every evening | ORAL | 0 refills | Status: DC | PRN

## 2022-11-07 MED ORDER — NIRMATRELVIR/RITONAVIR (PAXLOVID) TABLET (RENAL DOSING): 2.0000 | ORAL_TABLET | Freq: Two times a day (BID) | ORAL | 0 refills | Status: AC

## 2022-11-07 NOTE — Assessment & Plan Note (Signed)
Clinically stable and running its course.  No obvious complications. No red flag signs or symptoms. Within window of treatment.  Recommend to start Paxlovid, renal dosing for the next 5 days. Advised to rest and stay well-hydrated. ED precautions given COVID instructions given. Advised to contact the office if no better or worse within the next several days.

## 2022-11-07 NOTE — Progress Notes (Signed)
Jennifer Gibson 73 y.o.   Chief Complaint  Patient presents with   Acute Visit    Covid positive, cough, nasal congestion, chest congestion. Patient tested positive this am, symptoms started Monday    HISTORY OF PRESENT ILLNESS: This is a 72 y.o. female complaining of flulike symptoms that started 3 days ago.  Tested positive for COVID. Mostly complaining of cough and congestion.  Denies difficulty breathing. No other complaints or medical concerns today.  HPI   Prior to Admission medications   Medication Sig Start Date End Date Taking? Authorizing Provider  Apoaequorin (PREVAGEN PO) Take by mouth daily.   Yes [provider]  carvedilol (COREG) 25 MG tablet Take 1 tablet (25 mg total) by mouth 2 (two) times daily with a meal. Annual appt IS due must see provider for future refills 08/06/22  Yes Plotnikov, Evie Lacks, MD  Cetirizine HCl 10 MG CAPS Take by mouth daily.   Yes [provider]  Cholecalciferol (VITAMIN D3) 50 MCG (2000 UT) capsule Take 1 capsule (2,000 Units total) by mouth daily. 11/30/20  Yes Plotnikov, Evie Lacks, MD  escitalopram (LEXAPRO) 5 MG tablet TAKE 1 TABLET DAILY 05/29/22  Yes Plotnikov, Evie Lacks, MD  losartan (COZAAR) 25 MG tablet TAKE 1 TABLET TWICE A DAY Annual appt is due in must see provider for future refills 08/12/22  Yes Plotnikov, Evie Lacks, MD  Multiple Vitamin (MULTIVITAMIN) capsule Take 1 capsule by mouth daily.   Yes [provider]  Omega-3 Fatty Acids (OMEGA 3 PO) Take 1 capsule by mouth daily. Mega red   Yes [provider]  pantoprazole (PROTONIX) 40 MG tablet Take 1 tablet (40 mg total) by mouth daily. Annual appt due August must see provider for future refills 07/01/22  Yes Plotnikov, Evie Lacks, MD  spironolactone-hydrochlorothiazide (ALDACTAZIDE) 25-25 MG tablet Take 2 tablets by mouth daily. 01/12/22  Yes Plotnikov, Evie Lacks, MD  phentermine (ADIPEX-P) 37.5 MG tablet Take 1 tablet (37.5 mg total) by mouth daily  before breakfast. 12/14/21 01/13/22  Plotnikov, Evie Lacks, MD    Allergies  Allergen Reactions   Grass Pollen([id number]-[id number]-Swt Vern)    Penicillins     REACTION: yeast infection    Patient Active Problem List   Diagnosis Date Noted   Plantar fasciitis 06/07/2021   Chronic renal insufficiency, stage 3 (moderate) ([place]) 12/31/2018   Grief 12/31/2018   Patellar bursitis of left knee 05/16/2016   GERD (gastroesophageal reflux disease) 02/27/2016   Sinusitis, acute 08/01/2015   Well adult exam 06/30/2012   Right knee pain 04/21/2012   Pre-operative clearance 08/07/2011   Obesity 08/10/2010   ABNORMAL ELECTROCARDIOGRAM 08/10/2010   THYROID NODULE 02/09/2010   OSA on CPAP 11/07/2008   INSOMNIA, PERSISTENT 10/11/2008   Snoring 10/11/2008   Anemia 06/09/2007   Anxiety disorder 06/09/2007   Essential hypertension 06/09/2007   Venous (peripheral) insufficiency 06/09/2007   Abnormal weight gain 06/09/2007   HYPOKALEMIA, HX OF 06/09/2007    Past Medical History:  Diagnosis Date   Allergy    Anemia    Anxiety    Cataract    Chronic kidney disease    stage 3   Detached retina, right 11/05/2007   Hypertension    Morbid obesity ([place])    Sleep apnea    doesn't wear CPAP   Venous insufficiency of leg    right foot / ankle -sev.yeras ago    Past Surgical History:  Procedure Laterality Date   ABDOMINAL HYSTERECTOMY     partial  COLONOSCOPY     CYSTECTOMY     left shoulder cyst   POLYPECTOMY      Social History   Socioeconomic History   Marital status: Widowed    Spouse name: Not on file   Number of children: Not on file   Years of education: Not on file   Highest education level: Not on file  Occupational History   Not on file  Tobacco Use   Smoking status: Never   Smokeless tobacco: Never   Tobacco comments:    alot of second hand smoke  Vaping Use   Vaping Use: Never used  Substance and Sexual Activity   Alcohol use: No    Alcohol/week: 0.0 standard drinks of  alcohol   Drug use: No   Sexual activity: Not on file  Other Topics Concern   Not on file  Social History Narrative   Not on file   Social Determinants of Health   Financial Resource Strain: Low Risk  (06/10/2022)   Overall Financial Resource Strain (CARDIA)    Difficulty of Paying Living Expenses: Not hard at all  Food Insecurity: No Food Insecurity (06/10/2022)   Hunger Vital Sign    Worried About Running Out of Food in the Last Year: Never true    [place] in the Last Year: Never true  Transportation Needs: No Transportation Needs (06/10/2022)   PRAPARE - Hydrologist (Medical): No    Lack of Transportation (Non-Medical): No  Physical Activity: Insufficiently Active (06/10/2022)   Exercise Vital Sign    Days of Exercise per Week: 3 days    Minutes of Exercise per Session: 30 min  Stress: No Stress Concern Present (06/10/2022)   [place]    Feeling of Stress : Not at all  Social Connections: Moderately Integrated (06/10/2022)   Social Connection and Isolation Panel [NHANES]    Frequency of Communication with Friends and Family: Three times a week    Frequency of Social Gatherings with Friends and Family: Three times a week    Attends Religious Services: More than 4 times per year    Active Member of Clubs or Organizations: Yes    Attends Archivist Meetings: More than 4 times per year    Marital Status: Widowed  Intimate Partner Violence: Not At Risk (06/10/2022)   Humiliation, Afraid, Rape, and Kick questionnaire    Fear of Current or Ex-Partner: No    Emotionally Abused: No    Physically Abused: No    Sexually Abused: No    Family History  Problem Relation Age of Onset   Heart disease Father    Hypertension Other    Colon polyps Brother        benign   Colon cancer Neg Hx    Esophageal cancer Neg Hx    Pancreatic cancer Neg Hx    Rectal cancer Neg Hx    Stomach  cancer Neg Hx      Review of Systems  Constitutional: Negative.  Negative for chills and fever.  HENT:  Positive for congestion.   Respiratory:  Positive for cough.   Cardiovascular: Negative.  Negative for chest pain and palpitations.  Gastrointestinal:  Negative for abdominal pain, diarrhea, nausea and vomiting.  Genitourinary: Negative.  Negative for dysuria and hematuria.  Skin: Negative.  Negative for rash.  Neurological: Negative.  Negative for dizziness and headaches.  All other systems reviewed and are negative.  Today's Vitals   11/07/22 1609  BP: 128/70  Pulse: 70  Temp: 98.2 F (36.8 C)  TempSrc: Oral  SpO2: 95%  Weight: 278 lb 6 oz (126.3 kg)  Height: '5\' 5"'$  (1.651 m)   Body mass index is 46.32 kg/m.  Physical Exam Vitals reviewed.  Constitutional:      Appearance: Normal appearance.  HENT:     Head: Normocephalic.     Right Ear: Tympanic membrane, ear canal and external ear normal.     Left Ear: Tympanic membrane, ear canal and external ear normal.     Mouth/Throat:     Mouth: Mucous membranes are moist.     Pharynx: Oropharynx is clear.  Eyes:     Extraocular Movements: Extraocular movements intact.     Conjunctiva/sclera: Conjunctivae normal.     Pupils: Pupils are equal, round, and reactive to light.  Cardiovascular:     Rate and Rhythm: Normal rate and regular rhythm.     Pulses: Normal pulses.     Heart sounds: Normal heart sounds.  Pulmonary:     Effort: Pulmonary effort is normal.     Breath sounds: Normal breath sounds.  Musculoskeletal:     Cervical back: No tenderness.  Lymphadenopathy:     Cervical: No cervical adenopathy.  Skin:    General: Skin is warm and dry.  Neurological:     General: No focal deficit present.     Mental Status: She is alert and oriented to person, place, and time.  Psychiatric:        Mood and Affect: Mood normal.        Behavior: Behavior normal.      ASSESSMENT & PLAN: A total of 42 minutes was  spent with the patient and counseling/coordination of care regarding preparing for this visit, review of most recent office visit notes, review of multiple chronic medical conditions under management, review of all medications, diagnosis of COVID infection and management, ED precautions, need to start antiviral medication today, cough management, prognosis, documentation and need for follow-up if no better or worse during the next several days.  Problem List Items Addressed This Visit       Cardiovascular and Mediastinum   Essential hypertension    Well-controlled hypertension. Continue Aldactazide 25-25 2 tablets daily and losartan 25 mg daily Continue carvedilol 25 mg twice a day BP Readings from Last 3 Encounters:  11/07/22 128/70  08/08/22 (!) 95/48  12/14/21 128/74          Other   COVID-19 virus infection - Primary    Clinically stable and running its course.  No obvious complications. No red flag signs or symptoms. Within window of treatment.  Recommend to start Paxlovid, renal dosing for the next 5 days. Advised to rest and stay well-hydrated. ED precautions given COVID instructions given. Advised to contact the office if no better or worse within the next several days.      Relevant Medications   nirmatrelvir/ritonavir, renal dosing, (PAXLOVID) 10 x 150 MG & 10 x '100MG'$  TABS   Acute cough    Continue over-the-counter Mucinex DM and cough drops. Advised to rest and stay well-hydrated May use Hycodan syrup as needed      Relevant Medications   HYDROcodone bit-homatropine (HYCODAN) 5-1.5 MG/5ML syrup   Patient Instructions  COVID-19 COVID-19, or coronavirus disease 2019, is an infection that is caused by a new (novel) coronavirus called SARS-CoV-2. COVID-19 can cause many symptoms. In some people, the virus may not cause any  symptoms. In others, it may cause mild or severe symptoms. Some people with severe infection develop severe disease. What are the causes? This  illness is caused by a virus. The virus may be in the air as tiny specks of fluid (aerosols) or droplets, or it may be on surfaces. You may catch the virus by: Breathing in droplets from an infected person. Droplets can be spread by a person breathing, speaking, singing, coughing, or sneezing. Touching something, like a table or a doorknob, that has virus on it (is contaminated) and then touching your mouth, nose, or eyes. What increases the risk? Risk for infection: You are more likely to get infected with the COVID-19 virus if: You are within 6 ft (1.8 m) of a person with COVID-19 for 15 minutes or longer. You are providing care for a person who is infected with COVID-19. You are in close personal contact with other people. Close personal contact includes hugging, kissing, or sharing eating or drinking utensils. Risk for serious illness caused by COVID-19: You are more likely to get seriously ill from the COVID-19 virus if: You have cancer. You have a long-term (chronic) disease, such as: Chronic lung disease. This includes pulmonary embolism, chronic obstructive pulmonary disease, and cystic fibrosis. Long-term disease that lowers your body's ability to fight infection (immunocompromise). Serious cardiac conditions, such as heart failure, coronary artery disease, or cardiomyopathy. Diabetes. Chronic kidney disease. Liver diseases. These include cirrhosis, nonalcoholic fatty liver disease, alcoholic liver disease, or autoimmune hepatitis. You have obesity. You are pregnant or were recently pregnant. You have sickle cell disease. What are the signs or symptoms? Symptoms of this condition can range from mild to severe. Symptoms may appear any time from 2 to 14 days after being exposed to the virus. They include: Fever or chills. Shortness of breath or trouble breathing. Feeling tired or very tired. Headaches, body aches, or muscle aches. Runny or stuffy nose, sneezing, coughing, or sore  throat. New loss of taste or smell. This is rare. Some people may also have stomach problems, such as nausea, vomiting, or diarrhea. Other people may not have any symptoms of COVID-19. How is this diagnosed? This condition may be diagnosed by testing samples to check for the COVID-19 virus. The most common tests are the PCR test and the antigen test. Tests may be done in the lab or at home. They include: Using a swab to take a sample of fluid from the back of your nose and throat (nasopharyngeal fluid), from your nose, or from your throat. Testing a sample of saliva from your mouth. Testing a sample of coughed-up mucus from your lungs (sputum). How is this treated? Treatment for COVID-19 infection depends on the severity of the condition. Mild symptoms can be managed at home with rest, fluids, and over-the-counter medicines. Serious symptoms may be treated in a hospital intensive care unit (ICU). Treatment in the ICU may include: Supplemental oxygen. Extra oxygen is given through a tube in the nose, a face mask, or a hood. Medicines. These may include: Antivirals, such as monoclonal antibodies. These help your body fight off certain viruses that can cause disease. Anti-inflammatories, such as corticosteroids. These reduce inflammation and suppress the immune system. Antithrombotics. These prevent or treat blood clots, if they develop. Convalescent plasma. This helps boost your immune system, if you have an underlying immunosuppressive condition or are getting immunosuppressive treatments. Prone positioning. This means you will lie on your stomach. This helps oxygen to get into your lungs. Infection control measures.  If you are at risk for more serious illness caused by COVID-19, your health care provider may prescribe two long-acting monoclonal antibodies, given together every 6 months. How is this prevented? To protect yourself: Use preventive medicine (pre-exposure prophylaxis). You may get  pre-exposure prophylaxis if you have moderate or severe immunocompromise. Get vaccinated. Anyone 53 months old or older who meets guidelines can get a COVID-19 vaccine or vaccine series. This includes people who are pregnant or making breast milk (lactating). Get an added dose of COVID-19 vaccine after your first vaccine or vaccine series if you have moderate to severe immunocompromise. This applies if you have had a solid organ transplant or have been diagnosed with an immunocompromising condition. You should get the added dose 4 weeks after you got the first COVID-19 vaccine or vaccine series. If you get an mRNA vaccine, you will need a 3-dose primary series. If you get the J&J/Janssen vaccine, you will need a 2-dose primary series, with the second dose being an mRNA vaccine. Talk to your health care provider about getting experimental monoclonal antibodies. This treatment is approved under emergency use authorization to prevent severe illness before or after being exposed to the COVID-19 virus. You may be given monoclonal antibodies if: You have moderate or severe immunocompromise. This includes treatments that lower your immune response. People with immunocompromise may not develop protection against COVID-19 when they are vaccinated. You cannot be vaccinated. You may not get a vaccine if you have a severe allergic reaction to the vaccine or its components. You are not fully vaccinated. You are in a facility where COVID-19 is present and: Are in close contact with a person who is infected with the COVID-19 virus. Are at high risk of being exposed to the COVID-19 virus. You are at risk of illness from new variants of the COVID-19 virus. To protect others: If you have symptoms of COVID-19, take steps to prevent the virus from spreading to others. Stay home. Leave your house only to get medical care. Do not use public transit, if possible. Do not travel while you are sick. Wash your hands often  with soap and water for at least 20 seconds. If soap and water are not available, use alcohol-based hand sanitizer. Make sure that all people in your household wash their hands well and often. Cough or sneeze into a tissue or your sleeve or elbow. Do not cough or sneeze into your hand or into the air. Where to find more information Centers for Disease Control and Prevention: CharmCourses.be World Health Organization: https://www.castaneda.info/ Get help right away if: You have trouble breathing. You have pain or pressure in your chest. You are confused. You have bluish lips and fingernails. You have trouble waking from sleep. You have symptoms that get worse. These symptoms may be an emergency. Get help right away. Call 911. Do not wait to see if the symptoms will go away. Do not drive yourself to the hospital. Summary COVID-19 is an infection that is caused by a new coronavirus. Sometimes, there are no symptoms. Other times, symptoms range from mild to severe. Some people with a severe COVID-19 infection develop severe disease. The virus that causes COVID-19 can spread from person to person through droplets or aerosols from breathing, speaking, singing, coughing, or sneezing. Mild symptoms of COVID-19 can be managed at home with rest, fluids, and over-the-counter medicines. This information is not intended to replace advice given to you by your health care provider. Make sure you discuss any questions you have  with your health care provider. Document Revised: 10/09/2021 Document Reviewed: 10/11/2021 Elsevier Patient Education  [place], MD [facility]

## 2022-11-07 NOTE — Assessment & Plan Note (Signed)
Well-controlled hypertension. Continue Aldactazide 25-25 2 tablets daily and losartan 25 mg daily Continue carvedilol 25 mg twice a day BP Readings from Last 3 Encounters:  11/07/22 128/70  08/08/22 (!) 95/48  12/14/21 128/74

## 2022-11-07 NOTE — Assessment & Plan Note (Signed)
Continue over-the-counter Mucinex DM and cough drops. Advised to rest and stay well-hydrated May use Hycodan syrup as needed

## 2022-11-07 NOTE — Patient Instructions (Signed)

## 2022-11-11 ENCOUNTER — Other Ambulatory Visit: Payer: Self-pay | Admitting: Internal Medicine

## 2022-11-11 MED ORDER — PROMETHAZINE-DM 6.25-15 MG/5ML PO SYRP: 5.0000 mL | ORAL_SOLUTION | Freq: Four times a day (QID) | ORAL | 0 refills | Status: AC | PRN

## 2022-11-13 ENCOUNTER — Ambulatory Visit (INDEPENDENT_AMBULATORY_CARE_PROVIDER_SITE_OTHER): Payer: Medicare Other | Admitting: Internal Medicine

## 2022-11-13 ENCOUNTER — Encounter: Payer: Self-pay | Admitting: Internal Medicine

## 2022-11-13 VITALS — BP 130/82 | HR 86 | Temp 98.3°F | Ht 65.0 in | Wt 274.0 lb

## 2022-11-13 DIAGNOSIS — K219 Gastro-esophageal reflux disease without esophagitis: Secondary | ICD-10-CM | POA: Diagnosis not present

## 2022-11-13 DIAGNOSIS — J209 Acute bronchitis, unspecified: Secondary | ICD-10-CM | POA: Insufficient documentation

## 2022-11-13 DIAGNOSIS — U071 COVID-19: Secondary | ICD-10-CM

## 2022-11-13 DIAGNOSIS — Z6841 Body Mass Index (BMI) 40.0 and over, adult: Secondary | ICD-10-CM | POA: Diagnosis not present

## 2022-11-13 DIAGNOSIS — I10 Essential (primary) hypertension: Secondary | ICD-10-CM

## 2022-11-13 DIAGNOSIS — J208 Acute bronchitis due to other specified organisms: Secondary | ICD-10-CM | POA: Diagnosis not present

## 2022-11-13 MED ORDER — FLUCONAZOLE 150 MG PO TABS: 150.0000 mg | ORAL_TABLET | Freq: Once | ORAL | 1 refills | Status: AC

## 2022-11-13 MED ORDER — ESCITALOPRAM OXALATE 5 MG PO TABS: 5.0000 mg | ORAL_TABLET | Freq: Every day | ORAL | 3 refills | Status: DC

## 2022-11-13 MED ORDER — PANTOPRAZOLE SODIUM 40 MG PO TBEC: 40.0000 mg | DELAYED_RELEASE_TABLET | Freq: Every day | ORAL | 3 refills | Status: DC

## 2022-11-13 MED ORDER — SPIRONOLACTONE-HCTZ 25-25 MG PO TABS: 2.0000 | ORAL_TABLET | Freq: Every day | ORAL | 3 refills | Status: DC

## 2022-11-13 MED ORDER — CARVEDILOL 25 MG PO TABS: 25.0000 mg | ORAL_TABLET | Freq: Two times a day (BID) | ORAL | 3 refills | Status: DC

## 2022-11-13 MED ORDER — AZITHROMYCIN 250 MG PO TABS: ORAL_TABLET | ORAL | 0 refills | Status: DC

## 2022-11-13 MED ORDER — LOSARTAN POTASSIUM 25 MG PO TABS: ORAL_TABLET | ORAL | 3 refills | Status: DC

## 2022-11-13 NOTE — Patient Instructions (Signed)
For a mild COVID-19 case - take zinc 50 mg a day for 1 week, vitamin C 1000 mg daily for 1 week, vitamin D2 50,000 units weekly for 2 months (unless  taking vitamin D daily already), an antioxidant Quercetin 500 mg twice a day for 1 week (if you can get it quick enough). Take Allegra or Benadryl.  Maintain good oral hydration and take Tylenol for high fever.  Call if problems. Isolate for 5 days, then wear a mask for 5 days per CDC.

## 2022-11-13 NOTE — Assessment & Plan Note (Signed)
11/2022

## 2022-11-13 NOTE — Assessment & Plan Note (Signed)
Wt Readings from Last 3 Encounters:  11/13/22 274 lb (124.3 kg)  11/07/22 278 lb 6 oz (126.3 kg)  08/08/22 273 lb 9.6 oz (124.1 kg)

## 2022-11-13 NOTE — Assessment & Plan Note (Signed)
Cont w/Protonix

## 2022-11-13 NOTE — Assessment & Plan Note (Signed)
Post-COVID Start Z pac

## 2022-11-13 NOTE — Progress Notes (Signed)
Subjective:  Patient ID: Jennifer Gibson, female    DOB: 1951-09-01  Age: 72 y.o. MRN: [id number]  CC: No chief complaint on file.   HPI Jennifer Gibson presents for recent COVID last week - now w/productive cough - green mucus   Outpatient Medications Prior to Visit  Medication Sig Dispense Refill   Apoaequorin (PREVAGEN PO) Take by mouth daily.     Cetirizine HCl 10 MG CAPS Take by mouth daily.     Cholecalciferol (VITAMIN D3) 50 MCG (2000 UT) capsule Take 1 capsule (2,000 Units total) by mouth daily. 100 capsule 3   Multiple Vitamin (MULTIVITAMIN) capsule Take 1 capsule by mouth daily.     Omega-3 Fatty Acids (OMEGA 3 PO) Take 1 capsule by mouth daily. Mega red     promethazine-dextromethorphan (PROMETHAZINE-DM) 6.25-15 MG/5ML syrup Take 5 mLs by mouth 4 (four) times daily as needed for cough. 240 mL 0   carvedilol (COREG) 25 MG tablet Take 1 tablet (25 mg total) by mouth 2 (two) times daily with a meal. Annual appt IS due must see provider for future refills 180 tablet 0   escitalopram (LEXAPRO) 5 MG tablet TAKE 1 TABLET DAILY 90 tablet 2   HYDROcodone bit-homatropine (HYCODAN) 5-1.5 MG/5ML syrup Take 5 mLs by mouth at bedtime as needed for cough. 120 mL 0   losartan (COZAAR) 25 MG tablet TAKE 1 TABLET TWICE A DAY Annual appt is due in must see provider for future refills 180 tablet 0   pantoprazole (PROTONIX) 40 MG tablet Take 1 tablet (40 mg total) by mouth daily. Annual appt due August must see provider for future refills 90 tablet 0   spironolactone-hydrochlorothiazide (ALDACTAZIDE) 25-25 MG tablet Take 2 tablets by mouth daily. 180 tablet 3   phentermine (ADIPEX-P) 37.5 MG tablet Take 1 tablet (37.5 mg total) by mouth daily before breakfast. 30 tablet 2   No facility-administered medications prior to visit.    ROS: Review of Systems  Constitutional:  Negative for activity change, appetite change, chills, fatigue and unexpected weight change.  HENT:  Positive for congestion,  postnasal drip, rhinorrhea and sinus pressure. Negative for mouth sores.   Eyes:  Negative for visual disturbance.  Respiratory:  Positive for cough. Negative for chest tightness.   Gastrointestinal:  Negative for abdominal pain and nausea.  Genitourinary:  Negative for difficulty urinating, frequency and vaginal pain.  Musculoskeletal:  Negative for back pain and gait problem.  Skin:  Negative for pallor and rash.  Neurological:  Negative for dizziness, tremors, weakness, numbness and headaches.  Psychiatric/Behavioral:  Negative for confusion and sleep disturbance.     Objective:  BP 130/82 (BP Location: Left Arm, Patient Position: Sitting, Cuff Size: Large)   Pulse 86   Temp 98.3 F (36.8 C) (Oral)   Ht '5\' 5"'$  (1.651 m)   Wt 274 lb (124.3 kg)   SpO2 92%   BMI 45.60 kg/m   BP Readings from Last 3 Encounters:  11/13/22 130/82  11/07/22 128/70  08/08/22 (!) 95/48    Wt Readings from Last 3 Encounters:  11/13/22 274 lb (124.3 kg)  11/07/22 278 lb 6 oz (126.3 kg)  08/08/22 273 lb 9.6 oz (124.1 kg)    Physical Exam Constitutional:      General: She is not in acute distress.    Appearance: She is well-developed. She is obese.  HENT:     Head: Normocephalic.     Right Ear: External ear normal.     Left Ear:  External ear normal.     Nose: Nose normal.  Eyes:     General:        Right eye: No discharge.        Left eye: No discharge.     Conjunctiva/sclera: Conjunctivae normal.     Pupils: Pupils are equal, round, and reactive to light.  Neck:     Thyroid: No thyromegaly.     Vascular: No JVD.     Trachea: No tracheal deviation.  Cardiovascular:     Rate and Rhythm: Normal rate and regular rhythm.     Heart sounds: Normal heart sounds.  Pulmonary:     Effort: No respiratory distress.     Breath sounds: No stridor. Rhonchi present. No wheezing.  Abdominal:     General: Bowel sounds are normal. There is no distension.     Palpations: Abdomen is soft. There is no  mass.     Tenderness: There is no abdominal tenderness. There is no guarding or rebound.  Musculoskeletal:        General: No tenderness.     Cervical back: Normal range of motion and neck supple. No rigidity.  Lymphadenopathy:     Cervical: No cervical adenopathy.  Skin:    Findings: No erythema or rash.  Neurological:     Cranial Nerves: No cranial nerve deficit.     Motor: No abnormal muscle tone.     Coordination: Coordination normal.     Deep Tendon Reflexes: Reflexes normal.  Psychiatric:        Behavior: Behavior normal.        Thought Content: Thought content normal.        Judgment: Judgment normal.   Eryth nares  Lab Results  Component Value Date   WBC 6.1 12/14/2021   HGB 10.2 (A) 06/18/2022   HCT 31.9 (L) 12/14/2021   PLT 269.0 12/14/2021   GLUCOSE 91 12/14/2021   CHOL 187 06/07/2021   TRIG 70.0 06/07/2021   HDL 45.00 06/07/2021   LDLCALC 128 (H) 06/07/2021   ALT 12 12/14/2021   AST 16 12/14/2021   NA 139 06/18/2022   K 4.8 06/18/2022   CL 101 06/18/2022   CREATININE 1.5 (A) 06/18/2022   BUN 20 06/18/2022   CO2 25 (A) 06/18/2022   TSH 2.48 12/14/2021   HGBA1C 6.0 03/11/2014    US THYROID  Result Date: 12/18/2020 CLINICAL DATA:  Goiter.  Left thyroid nodule biopsy on 03/07/2010. EXAM: THYROID ULTRASOUND TECHNIQUE: Ultrasound examination of the thyroid gland and adjacent soft tissues was performed. COMPARISON:  02/23/2010 FINDINGS: Parenchymal Echotexture: Mildly heterogenous Isthmus: 0.3 cm, previously 0.2 cm Right lobe: 5.0 x 1.6 x 1.8 cm, previously 5.1 x 1.3 x 1.9 cm Left lobe: 6.4 x 3.2 x 2.8 cm, previously 5.4 x 2.2 x 2.5 cm _________________________________________________________ Estimated total number of nodules >/= 1 cm: 2 Number of spongiform nodules >/=  2 cm not described below (TR1): 0 Number of mixed cystic and solid nodules >/= 1.5 cm not described below ([place]): 0 _________________________________________________________ Nodule # 4: Location:  Right; Inferior Maximum size: 0.8 cm; Other 2 dimensions: 0.8 x 0.8 cm Composition: cannot determine (2) Echogenicity: hypoechoic (2) Shape: not taller-than-wide (0) Margins: ill-defined (0) Echogenic foci: none (0) ACR TI-RADS total points: 4. ACR TI-RADS risk category: TR4 (4-6 points). ACR TI-RADS recommendations: Given size (<0.9 cm) and appearance, this nodule does NOT meet TI-RADS criteria for biopsy or dedicated follow-up. _________________________________________________________ Nodule #5 is a primarily cystic nodule in the left  superior thyroid lobe that measures 1.0 x 0.6 x 1.1 cm. Nodule #6 is the previously biopsied nodule in the left inferior thyroid nodule. This nodule measures 4.5 x 2.6 x 3.1 cm and previously measured 3.9 x 1.8 x 2.7 cm. This nodule is heterogeneous and slightly hypoechoic. Morphology has not changed since 2011. IMPRESSION: 1. Multinodular goiter. 2. Previously biopsied nodule in the left inferior thyroid lobe has slightly enlarged. Morphology of this nodule has not significantly changed since 2011. 3. No new suspicious thyroid nodules. The above is in keeping with the ACR TI-RADS recommendations - J Am Coll Radiol 2017;14:587-595. Electronically Signed   By: Markus Daft M.D.   On: 12/18/2020 15:30    Assessment & Plan:   Problem List Items Addressed This Visit       Cardiovascular and Mediastinum   Essential hypertension    Chronic Coreg, Spironolactone, Doxazosin, Losartan      Relevant Medications   losartan (COZAAR) 25 MG tablet   spironolactone-hydrochlorothiazide (ALDACTAZIDE) 25-25 MG tablet   carvedilol (COREG) 25 MG tablet     Respiratory   Acute bronchitis    Post-COVID Start Z pac        Digestive   GERD (gastroesophageal reflux disease)    Cont w/Protonix      Relevant Medications   pantoprazole (PROTONIX) 40 MG tablet     Other   Obesity - Primary    Wt Readings from Last 3 Encounters:  11/13/22 274 lb (124.3 kg)  11/07/22 278 lb 6 oz  (126.3 kg)  08/08/22 273 lb 9.6 oz (124.1 kg)        COVID-19 virus infection    11/2022      Relevant Medications   azithromycin (ZITHROMAX Z-PAK) 250 MG tablet   fluconazole (DIFLUCAN) 150 MG tablet      Meds ordered this encounter  Medications   escitalopram (LEXAPRO) 5 MG tablet    Sig: Take 1 tablet (5 mg total) by mouth daily.    Dispense:  90 tablet    Refill:  3   losartan (COZAAR) 25 MG tablet    Sig: TAKE 1 TABLET TWICE A DAY Annual appt is due in must see provider for future refills    Dispense:  180 tablet    Refill:  3   spironolactone-hydrochlorothiazide (ALDACTAZIDE) 25-25 MG tablet    Sig: Take 2 tablets by mouth daily.    Dispense:  180 tablet    Refill:  3   carvedilol (COREG) 25 MG tablet    Sig: Take 1 tablet (25 mg total) by mouth 2 (two) times daily with a meal. Annual appt IS due must see provider for future refills    Dispense:  180 tablet    Refill:  3   pantoprazole (PROTONIX) 40 MG tablet    Sig: Take 1 tablet (40 mg total) by mouth daily. Annual appt due August must see provider for future refills    Dispense:  90 tablet    Refill:  3   azithromycin (ZITHROMAX Z-PAK) 250 MG tablet    Sig: As directed    Dispense:  6 tablet    Refill:  0   fluconazole (DIFLUCAN) 150 MG tablet    Sig: Take 1 tablet (150 mg total) by mouth once for 1 dose.    Dispense:  1 tablet    Refill:  1      Follow-up: No follow-ups on file.  Walker Kehr, MD

## 2022-11-13 NOTE — Assessment & Plan Note (Signed)
Chronic Coreg, Spironolactone, Doxazosin, Losartan

## 2022-12-24 DIAGNOSIS — E785 Hyperlipidemia, unspecified: Secondary | ICD-10-CM | POA: Diagnosis not present

## 2022-12-24 DIAGNOSIS — N1832 Chronic kidney disease, stage 3b: Secondary | ICD-10-CM | POA: Diagnosis not present

## 2022-12-24 DIAGNOSIS — D631 Anemia in chronic kidney disease: Secondary | ICD-10-CM | POA: Diagnosis not present

## 2022-12-24 DIAGNOSIS — I129 Hypertensive chronic kidney disease with stage 1 through stage 4 chronic kidney disease, or unspecified chronic kidney disease: Secondary | ICD-10-CM | POA: Diagnosis not present

## 2022-12-24 DIAGNOSIS — E669 Obesity, unspecified: Secondary | ICD-10-CM | POA: Diagnosis not present

## 2022-12-24 LAB — BASIC METABOLIC PANEL
BUN: 21 (ref 4–21)
CO2: 27 — AB (ref 13–22)
Chloride: 101 (ref 99–108)
Creatinine: 1.6 — AB (ref 0.5–1.1)
Glucose: 83
Potassium: 4.5 mEq/L (ref 3.5–5.1)
Sodium: 137 (ref 137–147)

## 2022-12-24 LAB — PROTEIN / CREATININE RATIO, URINE: Creatinine, Urine: 221.1

## 2022-12-24 LAB — CBC AND DIFFERENTIAL
HCT: 32 — AB (ref 36–46)
Hemoglobin: 10.4 — AB (ref 12.0–16.0)
Platelets: 315 10*3/uL (ref 150–400)
WBC: 6.6

## 2022-12-24 LAB — CBC: RBC: 3.96 (ref 3.87–5.11)

## 2022-12-24 LAB — TSH: TSH: 1.78 (ref 0.41–5.90)

## 2022-12-24 LAB — MICROALBUMIN / CREATININE URINE RATIO: Microalb Creat Ratio: 40

## 2022-12-24 LAB — HEPATIC FUNCTION PANEL
ALT: 12 U/L (ref 7–35)
AST: 17 (ref 13–35)
Alkaline Phosphatase: 68 (ref 25–125)
Bilirubin, Total: 0.3

## 2022-12-24 LAB — COMPREHENSIVE METABOLIC PANEL
Albumin: 4.3 (ref 3.5–5.0)
Calcium: 10.3 (ref 8.7–10.7)
Globulin: 2.3
eGFR: 33

## 2022-12-26 LAB — LAB REPORT - SCANNED
Creatinine, POC: 221.1 mg/dL
EGFR: 33
Microalb Creat Ratio: 40

## 2023-01-08 ENCOUNTER — Encounter: Payer: Self-pay | Admitting: Internal Medicine

## 2023-02-10 IMAGING — US US THYROID
1 series · 13 of 25 positions shown · non-contrast
Comparison: 02/23/2010

CLINICAL DATA: Goiter.  Left thyroid nodule biopsy on 03/07/2010.

EXAM:
THYROID ULTRASOUND
TECHNIQUE: Ultrasound examination of the thyroid gland and adjacent soft
tissues was performed.

[Series 1: us thyroid · 0.07mm/px · 13 of 49 slices shown]
[im 1/49]
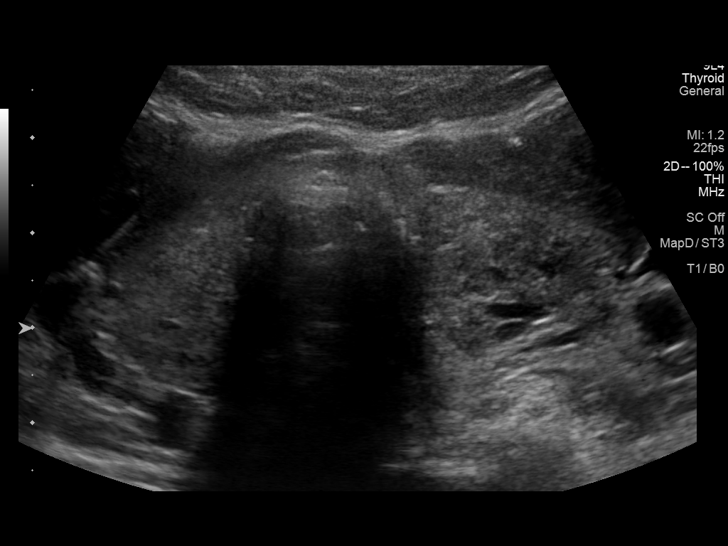
[im 5/49]
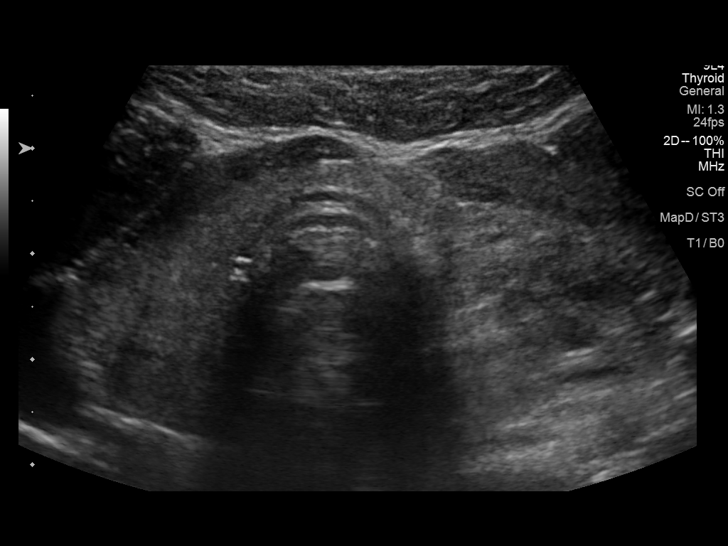
[im 9/49]
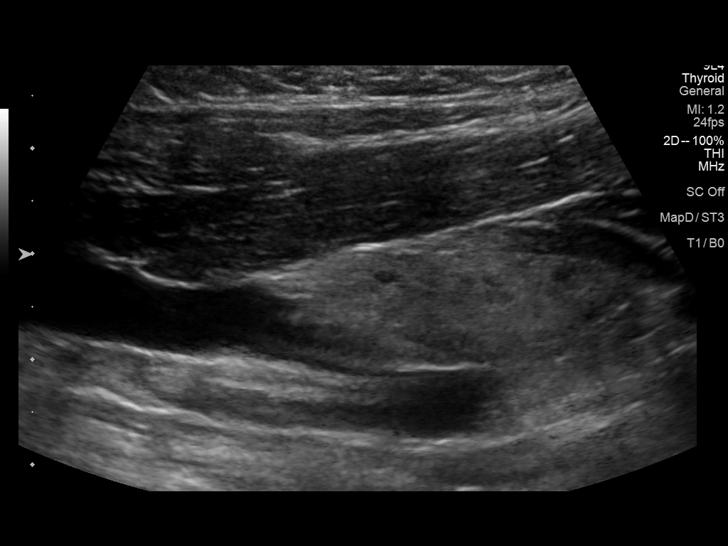
[im 13/49]
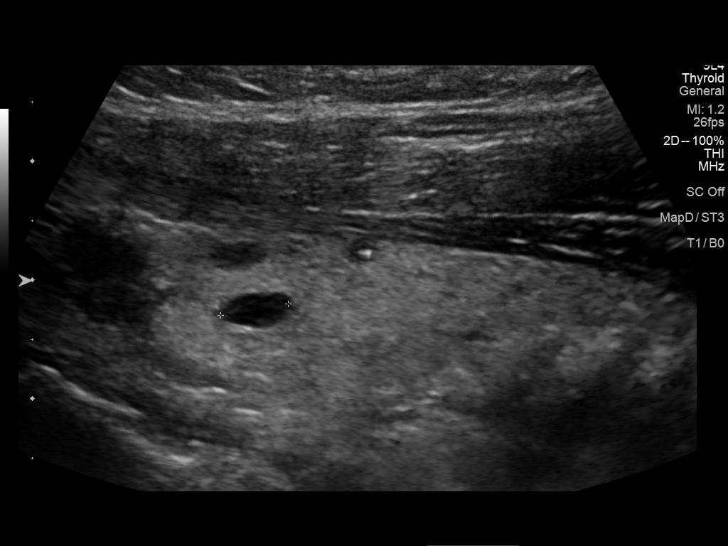
[im 17/49]
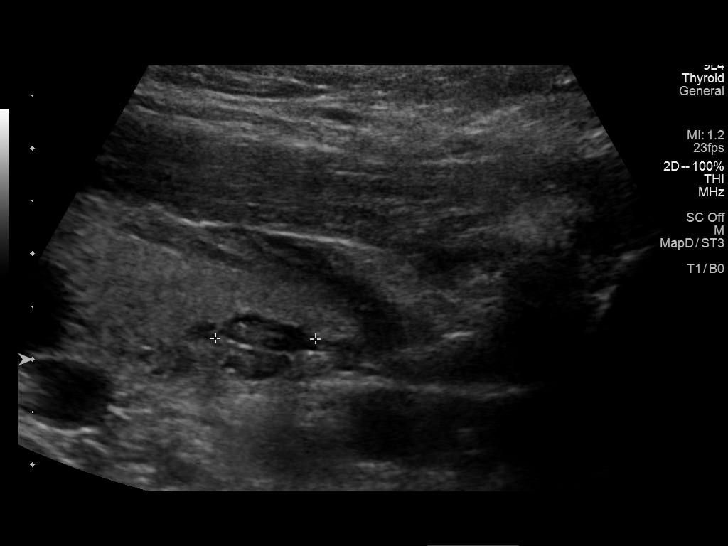
[im 21/49]
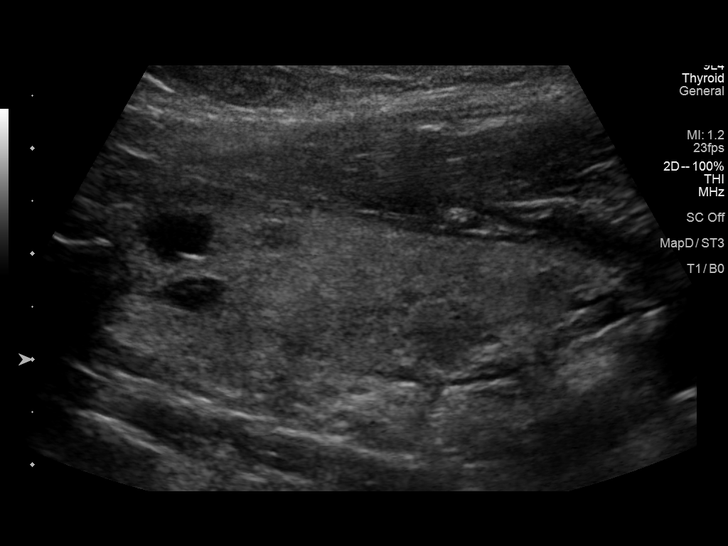
[im 25/49]
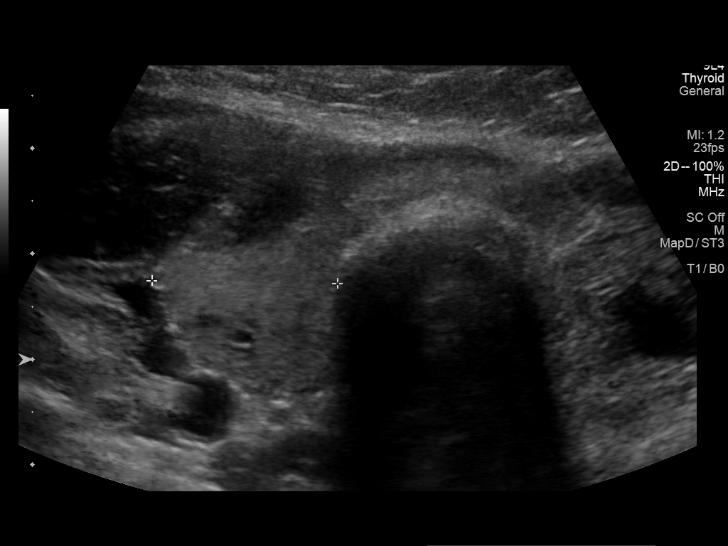
[im 29/49]
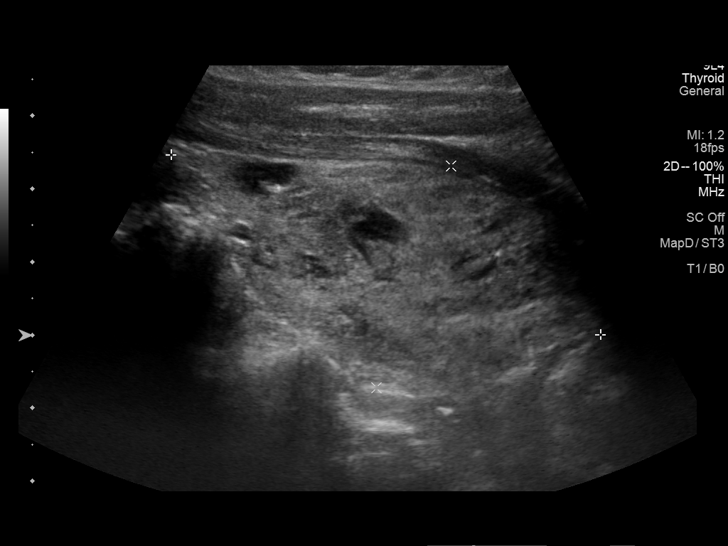
[im 33/49]
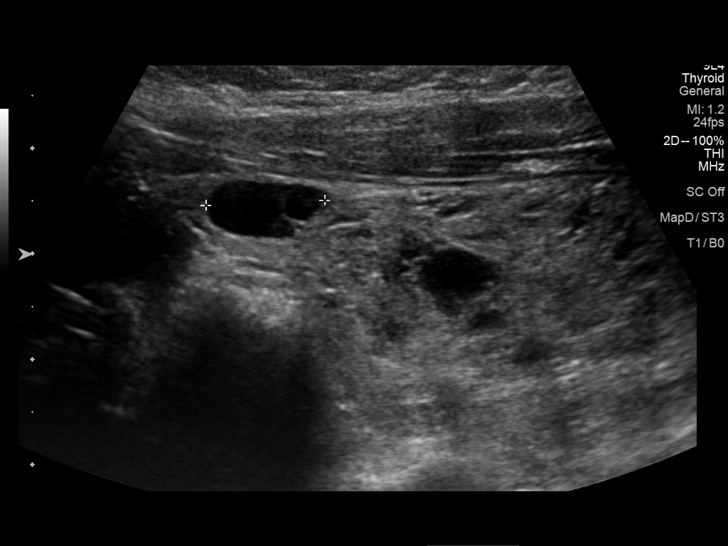
[im 37/49]
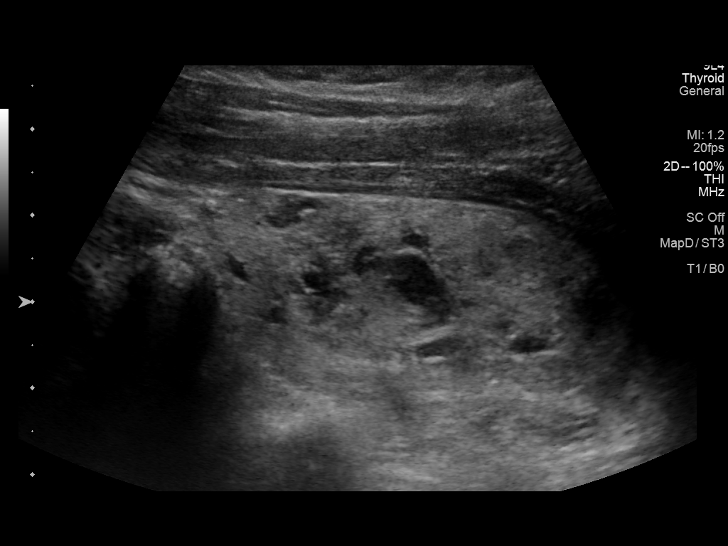
[im 41/49]
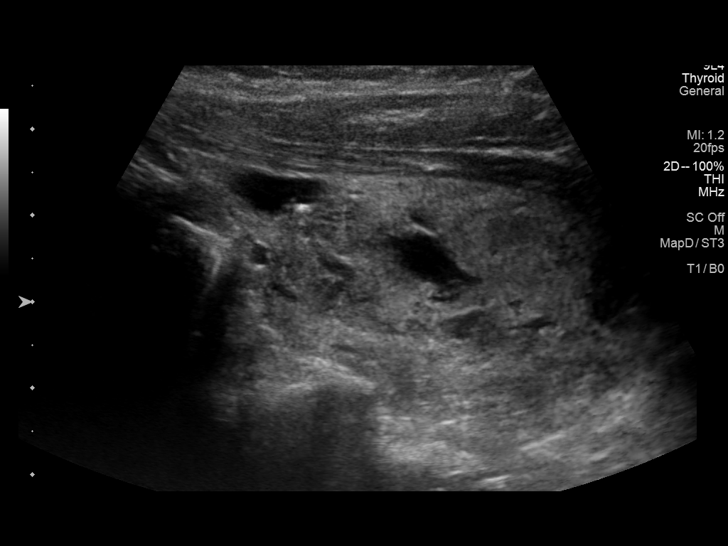
[im 45/49]
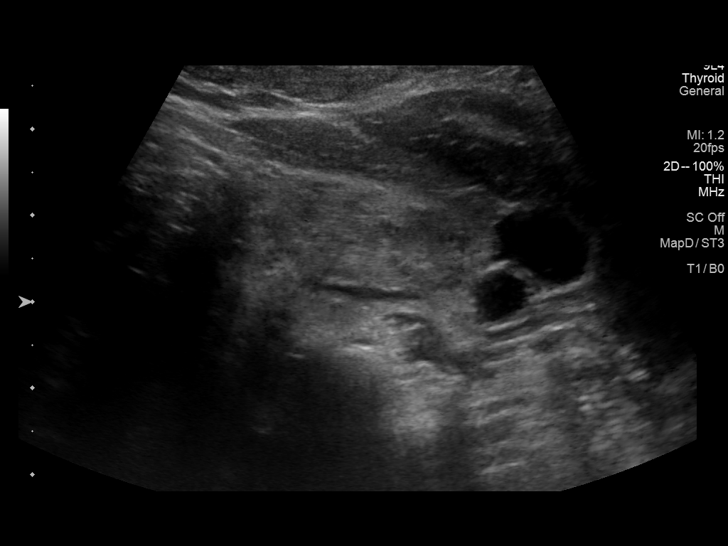
[im 49/49]
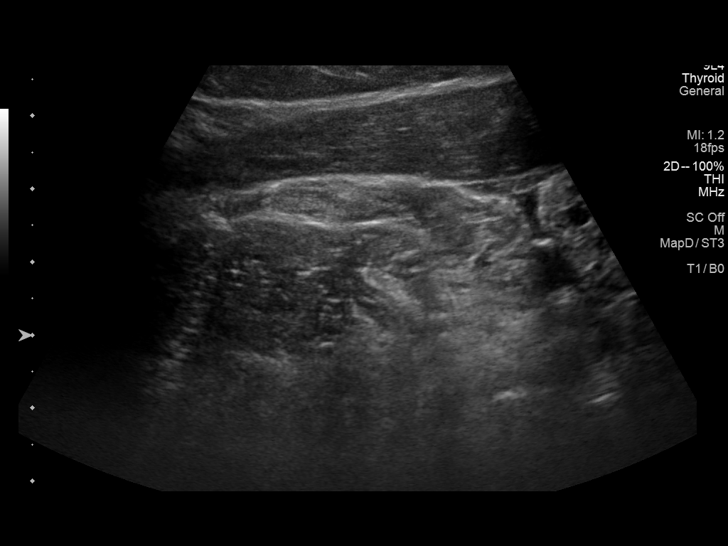

[13 of 25 positions shown; findings below may reference images not displayed]

FINDINGS: Parenchymal Echotexture: Mildly heterogenous

Isthmus: 0.3 cm, previously 0.2 cm

Right lobe: 5.0 x 1.6 x 1.8 cm, previously 5.1 x 1.3 x 1.9 cm

Left lobe: 6.4 x 3.2 x 2.8 cm, previously 5.4 x 2.2 x 2.5 cm

_________________________________________________________

Estimated total number of nodules >/= 1 cm: 2

Number of spongiform nodules >/=  2 cm not described below (TR1): 0

Number of mixed cystic and solid nodules >/= 1.5 cm not described
below (TR2): 0

_________________________________________________________

Nodule # 4:

Location: Right; Inferior

Maximum size: 0.8 cm; Other 2 dimensions: 0.8 x 0.8 cm

Composition: cannot determine (2)

Echogenicity: hypoechoic (2)

Shape: not taller-than-wide (0)

Margins: ill-defined (0)

Echogenic foci: none (0)

ACR TI-RADS total points: 4.

ACR TI-RADS risk category: TR4 (4-6 points).

ACR TI-RADS recommendations:

Given size (<0.9 cm) and appearance, this nodule does NOT meet
TI-RADS criteria for biopsy or dedicated follow-up.

_________________________________________________________

Nodule #5 is a primarily cystic nodule in the left superior thyroid
lobe that measures 1.0 x 0.6 x 1.1 cm.

Nodule #6 is the previously biopsied nodule in the left inferior
thyroid nodule. This nodule measures 4.5 x 2.6 x 3.1 cm and
previously measured 3.9 x 1.8 x 2.7 cm. This nodule is heterogeneous
and slightly hypoechoic. Morphology has not changed since 9599.
IMPRESSION: 1. Multinodular goiter.
2. Previously biopsied nodule in the left inferior thyroid lobe has
slightly enlarged. Morphology of this nodule has not significantly
changed since [DATE]. No new suspicious thyroid nodules.

The above is in keeping with the ACR TI-RADS recommendations - [HOSPITAL] 9175;[DATE].

## 2023-03-14 ENCOUNTER — Encounter: Payer: Self-pay | Admitting: Internal Medicine

## 2023-05-14 ENCOUNTER — Ambulatory Visit (INDEPENDENT_AMBULATORY_CARE_PROVIDER_SITE_OTHER): Payer: Medicare Other | Admitting: Internal Medicine

## 2023-05-14 ENCOUNTER — Encounter: Payer: Self-pay | Admitting: Internal Medicine

## 2023-05-14 VITALS — BP 120/78 | HR 75 | Temp 98.2°F | Ht 65.0 in | Wt 274.0 lb

## 2023-05-14 DIAGNOSIS — N183 Chronic kidney disease, stage 3 unspecified: Secondary | ICD-10-CM

## 2023-05-14 DIAGNOSIS — F419 Anxiety disorder, unspecified: Secondary | ICD-10-CM

## 2023-05-14 DIAGNOSIS — Z6841 Body Mass Index (BMI) 40.0 and over, adult: Secondary | ICD-10-CM

## 2023-05-14 DIAGNOSIS — N2889 Other specified disorders of kidney and ureter: Secondary | ICD-10-CM

## 2023-05-14 DIAGNOSIS — I10 Essential (primary) hypertension: Secondary | ICD-10-CM | POA: Diagnosis not present

## 2023-05-14 DIAGNOSIS — D649 Anemia, unspecified: Secondary | ICD-10-CM | POA: Diagnosis not present

## 2023-05-14 DIAGNOSIS — E66813 Obesity, class 3: Secondary | ICD-10-CM

## 2023-05-14 MED ORDER — WEGOVY 0.25 MG/0.5ML ~~LOC~~ SOAJ: 0.2500 mg | SUBCUTANEOUS | 1 refills | Status: DC

## 2023-05-14 NOTE — Assessment & Plan Note (Signed)
Monitor GFR Hydrate well 

## 2023-05-14 NOTE — Assessment & Plan Note (Signed)
  Chronic -possibly?renal insufficiency anemia Iron tests - nl

## 2023-05-14 NOTE — Assessment & Plan Note (Signed)
Lexapro - low dose 

## 2023-05-14 NOTE — Assessment & Plan Note (Signed)
BMI 45, HTN, OA Start on [facility]

## 2023-05-14 NOTE — Assessment & Plan Note (Signed)
Chronic Coreg, Spironolactone, Doxazosin, Losartan 

## 2023-05-14 NOTE — Progress Notes (Signed)
Subjective:  Patient ID: Jennifer Gibson, female    DOB: Jun 11, 1951  Age: 72 y.o. MRN: [id number]  CC: Follow-up (6 mnth f/u)   HPI Jennifer Gibson presents for HTN, depression, obesity  Outpatient Medications Prior to Visit  Medication Sig Dispense Refill   Apoaequorin (PREVAGEN PO) Take by mouth daily.     carvedilol (COREG) 25 MG tablet Take 1 tablet (25 mg total) by mouth 2 (two) times daily with a meal. Annual appt IS due must see provider for future refills 180 tablet 3   Cetirizine HCl 10 MG CAPS Take by mouth daily.     Cholecalciferol (VITAMIN D3) 50 MCG (2000 UT) capsule Take 1 capsule (2,000 Units total) by mouth daily. 100 capsule 3   escitalopram (LEXAPRO) 5 MG tablet Take 1 tablet (5 mg total) by mouth daily. 90 tablet 3   losartan (COZAAR) 25 MG tablet TAKE 1 TABLET TWICE A DAY Annual appt is due in must see provider for future refills 180 tablet 3   Multiple Vitamin (MULTIVITAMIN) capsule Take 1 capsule by mouth daily.     Omega-3 Fatty Acids (OMEGA 3 PO) Take 1 capsule by mouth daily. Mega red     pantoprazole (PROTONIX) 40 MG tablet Take 1 tablet (40 mg total) by mouth daily. Annual appt due August must see provider for future refills 90 tablet 3   promethazine-dextromethorphan (PROMETHAZINE-DM) 6.25-15 MG/5ML syrup Take 5 mLs by mouth 4 (four) times daily as needed for cough. 240 mL 0   spironolactone-hydrochlorothiazide (ALDACTAZIDE) 25-25 MG tablet Take 2 tablets by mouth daily. 180 tablet 3   azithromycin (ZITHROMAX Z-PAK) 250 MG tablet As directed (Patient not taking: Reported on 05/14/2023) 6 tablet 0   phentermine (ADIPEX-P) 37.5 MG tablet Take 1 tablet (37.5 mg total) by mouth daily before breakfast. 30 tablet 2   No facility-administered medications prior to visit.    ROS: Review of Systems  Constitutional:  Negative for activity change, appetite change, chills, fatigue and unexpected weight change.  HENT:  Negative for congestion, mouth sores and sinus  pressure.   Eyes:  Negative for visual disturbance.  Respiratory:  Negative for cough and chest tightness.   Gastrointestinal:  Negative for abdominal pain and nausea.  Genitourinary:  Negative for difficulty urinating, frequency and vaginal pain.  Musculoskeletal:  Negative for back pain and gait problem.  Skin:  Negative for pallor and rash.  Neurological:  Negative for dizziness, tremors, weakness, numbness and headaches.  Psychiatric/Behavioral:  Negative for confusion, sleep disturbance and suicidal ideas.     Objective:  BP 120/78 (BP Location: Left Arm, Patient Position: Sitting, Cuff Size: Large)   Pulse 75   Temp 98.2 F (36.8 C) (Oral)   Ht 5\' 5"  (1.651 m)   Wt 274 lb (124.3 kg)   SpO2 94%   BMI 45.60 kg/m   BP Readings from Last 3 Encounters:  05/14/23 120/78  11/13/22 130/82  11/07/22 128/70    Wt Readings from Last 3 Encounters:  05/14/23 274 lb (124.3 kg)  11/13/22 274 lb (124.3 kg)  11/07/22 278 lb 6 oz (126.3 kg)    Physical Exam Constitutional:      General: She is not in acute distress.    Appearance: She is well-developed.  HENT:     Head: Normocephalic.     Right Ear: External ear normal.     Left Ear: External ear normal.     Nose: Nose normal.  Eyes:     General:  Right eye: No discharge.        Left eye: No discharge.     Conjunctiva/sclera: Conjunctivae normal.     Pupils: Pupils are equal, round, and reactive to light.  Neck:     Thyroid: No thyromegaly.     Vascular: No JVD.     Trachea: No tracheal deviation.  Cardiovascular:     Rate and Rhythm: Normal rate and regular rhythm.     Heart sounds: Normal heart sounds.  Pulmonary:     Effort: No respiratory distress.     Breath sounds: No stridor. No wheezing.  Abdominal:     General: Bowel sounds are normal. There is no distension.     Palpations: Abdomen is soft. There is no mass.     Tenderness: There is no abdominal tenderness. There is no guarding or rebound.   Musculoskeletal:        General: No tenderness.     Cervical back: Normal range of motion and neck supple. No rigidity.  Lymphadenopathy:     Cervical: No cervical adenopathy.  Skin:    Findings: No erythema or rash.  Neurological:     Cranial Nerves: No cranial nerve deficit.     Motor: No abnormal muscle tone.     Coordination: Coordination normal.     Deep Tendon Reflexes: Reflexes normal.  Psychiatric:        Behavior: Behavior normal.        Thought Content: Thought content normal.        Judgment: Judgment normal.     Lab Results  Component Value Date   WBC 6.6 12/24/2022   HGB 10.4 (A) 12/24/2022   HCT 32 (A) 12/24/2022   PLT 315 12/24/2022   GLUCOSE 91 12/14/2021   CHOL 187 06/07/2021   TRIG 70.0 06/07/2021   HDL 45.00 06/07/2021   LDLCALC 128 (H) 06/07/2021   ALT 12 12/24/2022   AST 17 12/24/2022   NA 137 12/24/2022   K 4.5 12/24/2022   CL 101 12/24/2022   CREATININE 1.6 (A) 12/24/2022   BUN 21 12/24/2022   CO2 27 (A) 12/24/2022   TSH 1.78 12/24/2022   HGBA1C 6.0 03/11/2014    US THYROID  Result Date: 12/18/2020 CLINICAL DATA:  Goiter.  Left thyroid nodule biopsy on 03/07/2010. EXAM: THYROID ULTRASOUND TECHNIQUE: Ultrasound examination of the thyroid gland and adjacent soft tissues was performed. COMPARISON:  02/23/2010 FINDINGS: Parenchymal Echotexture: Mildly heterogenous Isthmus: 0.3 cm, previously 0.2 cm Right lobe: 5.0 x 1.6 x 1.8 cm, previously 5.1 x 1.3 x 1.9 cm Left lobe: 6.4 x 3.2 x 2.8 cm, previously 5.4 x 2.2 x 2.5 cm _________________________________________________________ Estimated total number of nodules >/= 1 cm: 2 Number of spongiform nodules >/=  2 cm not described below (TR1): 0 Number of mixed cystic and solid nodules >/= 1.5 cm not described below (TR2): 0 _________________________________________________________ Nodule # 4: Location: Right; Inferior Maximum size: 0.8 cm; Other 2 dimensions: 0.8 x 0.8 cm Composition: cannot determine (2)  Echogenicity: hypoechoic (2) Shape: not taller-than-wide (0) Margins: ill-defined (0) Echogenic foci: none (0) ACR TI-RADS total points: 4. ACR TI-RADS risk category: TR4 (4-6 points). ACR TI-RADS recommendations: Given size (<0.9 cm) and appearance, this nodule does NOT meet TI-RADS criteria for biopsy or dedicated follow-up. _________________________________________________________ Nodule #5 is a primarily cystic nodule in the left superior thyroid lobe that measures 1.0 x 0.6 x 1.1 cm. Nodule #6 is the previously biopsied nodule in the left inferior thyroid nodule. This nodule measures  4.5 x 2.6 x 3.1 cm and previously measured 3.9 x 1.8 x 2.7 cm. This nodule is heterogeneous and slightly hypoechoic. Morphology has not changed since 2011. IMPRESSION: 1. Multinodular goiter. 2. Previously biopsied nodule in the left inferior thyroid lobe has slightly enlarged. Morphology of this nodule has not significantly changed since 2011. 3. No new suspicious thyroid nodules. The above is in keeping with the ACR TI-RADS recommendations - J Am Coll Radiol 2017;14:587-595. Electronically Signed   By: Richarda Overlie M.D.   On: 12/18/2020 15:30    Assessment & Plan:   Problem List Items Addressed This Visit     Obesity - Primary    BMI 45, HTN, OA Start on [facility]      Relevant Medications   Semaglutide-Weight Management (WEGOVY) 0.25 MG/0.5ML SOAJ   Anemia     Chronic -possibly?renal insufficiency anemia Iron tests - nl      Anxiety disorder    Lexapro - low dose      Essential hypertension    Chronic Coreg, Spironolactone, Doxazosin, Losartan       Chronic renal insufficiency, stage 3 (moderate) (HCC)    Monitor GFR Hydrate well         Meds ordered this encounter  Medications   Semaglutide-Weight Management (WEGOVY) 0.25 MG/0.5ML SOAJ    Sig: Inject 0.25 mg into the skin once a week.    Dispense:  2 mL    Refill:  1    BMI 45, HTN, OA      Follow-up: Return in about 2 months (around  07/15/2023) for a follow-up visit.  Sonda Primes, MD

## 2023-05-20 ENCOUNTER — Telehealth: Payer: Self-pay

## 2023-05-20 NOTE — Telephone Encounter (Signed)
Received notification that [name] has been denied. Full denial letter attached to charts.

## 2023-05-20 NOTE — Telephone Encounter (Signed)
Pt must have tried and failed phentermine, qsymia and contrave.Marland KitchenRaechel Gibson

## 2023-05-28 NOTE — Telephone Encounter (Signed)
Sent pt a msg via [place] [facility] [place]...Raechel Chute

## 2023-05-28 NOTE — Telephone Encounter (Signed)
Jennifer Gibson, please notify the patient is she willing to try those 3 alternatives?  Thank you

## 2023-06-16 DIAGNOSIS — D631 Anemia in chronic kidney disease: Secondary | ICD-10-CM | POA: Diagnosis not present

## 2023-06-16 DIAGNOSIS — I129 Hypertensive chronic kidney disease with stage 1 through stage 4 chronic kidney disease, or unspecified chronic kidney disease: Secondary | ICD-10-CM | POA: Diagnosis not present

## 2023-06-16 DIAGNOSIS — E669 Obesity, unspecified: Secondary | ICD-10-CM | POA: Diagnosis not present

## 2023-06-16 DIAGNOSIS — N1832 Chronic kidney disease, stage 3b: Secondary | ICD-10-CM | POA: Diagnosis not present

## 2023-06-19 ENCOUNTER — Encounter (INDEPENDENT_AMBULATORY_CARE_PROVIDER_SITE_OTHER): Payer: Self-pay

## 2023-06-23 ENCOUNTER — Encounter: Payer: Self-pay | Admitting: Nephrology

## 2023-07-15 ENCOUNTER — Encounter: Payer: Self-pay | Admitting: Internal Medicine

## 2023-07-15 ENCOUNTER — Ambulatory Visit (INDEPENDENT_AMBULATORY_CARE_PROVIDER_SITE_OTHER): Payer: Medicare Other | Admitting: Internal Medicine

## 2023-07-15 VITALS — BP 118/62 | HR 74 | Temp 97.7°F | Ht 65.0 in | Wt 270.0 lb

## 2023-07-15 DIAGNOSIS — J208 Acute bronchitis due to other specified organisms: Secondary | ICD-10-CM

## 2023-07-15 DIAGNOSIS — I10 Essential (primary) hypertension: Secondary | ICD-10-CM | POA: Diagnosis not present

## 2023-07-15 DIAGNOSIS — J069 Acute upper respiratory infection, unspecified: Secondary | ICD-10-CM | POA: Diagnosis not present

## 2023-07-15 DIAGNOSIS — Z0000 Encounter for general adult medical examination without abnormal findings: Secondary | ICD-10-CM

## 2023-07-15 DIAGNOSIS — N183 Chronic kidney disease, stage 3 unspecified: Secondary | ICD-10-CM

## 2023-07-15 DIAGNOSIS — G4733 Obstructive sleep apnea (adult) (pediatric): Secondary | ICD-10-CM

## 2023-07-15 DIAGNOSIS — Z6841 Body Mass Index (BMI) 40.0 and over, adult: Secondary | ICD-10-CM | POA: Diagnosis not present

## 2023-07-15 NOTE — Assessment & Plan Note (Addendum)
Not using a CPAP machine Pulm ref to re-start CPAP

## 2023-07-15 NOTE — Assessment & Plan Note (Signed)
Resolved

## 2023-07-15 NOTE — Assessment & Plan Note (Signed)
Chronic Coreg, Spironolactone, Doxazosin, Losartan 

## 2023-07-15 NOTE — Progress Notes (Signed)
Subjective:  Patient ID: Jennifer Gibson, female    DOB: [birth year]  Age: 72 y.o. MRN: [id number]  CC: Follow-up (2 mnth f/u)   HPI Jennifer Gibson presents for obesity BCBS not covering Wegovy - has not started yet Pt lost 4 lbs on diet  F/u OSA - not using CPAP  Jennifer Gibson had a URI - recovered   Outpatient Medications Prior to Visit  Medication Sig Dispense Refill   Apoaequorin (PREVAGEN PO) Take by mouth daily.     carvedilol (COREG) 25 MG tablet Take 1 tablet (25 mg total) by mouth 2 (two) times daily with a meal. Annual appt IS due must see provider for future refills 180 tablet 3   Cetirizine HCl 10 MG CAPS Take by mouth daily.     Cholecalciferol (VITAMIN D3) 50 MCG (2000 UT) capsule Take 1 capsule (2,000 Units total) by mouth daily. 100 capsule 3   escitalopram (LEXAPRO) 5 MG tablet Take 1 tablet (5 mg total) by mouth daily. 90 tablet 3   losartan (COZAAR) 25 MG tablet TAKE 1 TABLET TWICE A DAY Annual appt is due in must see provider for future refills 180 tablet 3   Multiple Vitamin (MULTIVITAMIN) capsule Take 1 capsule by mouth daily.     Omega-3 Fatty Acids (OMEGA 3 PO) Take 1 capsule by mouth daily. Mega red     pantoprazole (PROTONIX) 40 MG tablet Take 1 tablet (40 mg total) by mouth daily. Annual appt due August must see provider for future refills 90 tablet 3   promethazine-dextromethorphan (PROMETHAZINE-DM) 6.25-15 MG/5ML syrup Take 5 mLs by mouth 4 (four) times daily as needed for cough. 240 mL 0   phentermine (ADIPEX-P) 37.5 MG tablet Take 1 tablet (37.5 mg total) by mouth daily before breakfast. 30 tablet 2   spironolactone-hydrochlorothiazide (ALDACTAZIDE) 25-25 MG tablet Take 2 tablets by mouth daily. (Patient not taking: Reported on 07/15/2023) 180 tablet 3   azithromycin (ZITHROMAX Z-PAK) 250 MG tablet As directed (Patient not taking: Reported on 05/14/2023) 6 tablet 0   Semaglutide-Weight Management (WEGOVY) 0.25 MG/0.5ML SOAJ Inject 0.25 mg into the skin once a week. 2  mL 1   No facility-administered medications prior to visit.    ROS: Review of Systems  Constitutional:  Negative for activity change, appetite change, chills, fatigue and unexpected weight change.  HENT:  Negative for congestion, mouth sores and sinus pressure.   Eyes:  Negative for visual disturbance.  Respiratory:  Negative for cough and chest tightness.   Gastrointestinal:  Negative for abdominal pain and nausea.  Genitourinary:  Negative for difficulty urinating, frequency and vaginal pain.  Musculoskeletal:  Negative for back pain and gait problem.  Skin:  Negative for pallor and rash.  Neurological:  Negative for dizziness, tremors, weakness, numbness and headaches.  Psychiatric/Behavioral:  Negative for confusion and sleep disturbance.     Objective:  BP 118/62 (BP Location: Right Arm, Patient Position: Sitting, Cuff Size: Large)   Pulse 74   Temp 97.7 F (36.5 C) (Oral)   Ht 5\' 5"  (1.651 m)   Wt 270 lb (122.5 kg)   SpO2 92%   BMI 44.93 kg/m   BP Readings from Last 3 Encounters:  07/15/23 118/62  05/14/23 120/78  11/13/22 130/82    Wt Readings from Last 3 Encounters:  07/15/23 270 lb (122.5 kg)  05/14/23 274 lb (124.3 kg)  11/13/22 274 lb (124.3 kg)    Physical Exam Constitutional:      General: She is not in  acute distress.    Appearance: She is well-developed. She is obese.  HENT:     Head: Normocephalic.     Right Ear: External ear normal.     Left Ear: External ear normal.     Nose: Nose normal.  Eyes:     General:        Right eye: No discharge.        Left eye: No discharge.     Conjunctiva/sclera: Conjunctivae normal.     Pupils: Pupils are equal, round, and reactive to light.  Neck:     Thyroid: No thyromegaly.     Vascular: No JVD.     Trachea: No tracheal deviation.  Cardiovascular:     Rate and Rhythm: Normal rate and regular rhythm.     Heart sounds: Normal heart sounds.  Pulmonary:     Effort: No respiratory distress.     Breath  sounds: No stridor. No wheezing.  Abdominal:     General: Bowel sounds are normal. There is no distension.     Palpations: Abdomen is soft. There is no mass.     Tenderness: There is no abdominal tenderness. There is no guarding or rebound.  Musculoskeletal:        General: No tenderness.     Cervical back: Normal range of motion and neck supple. No rigidity.  Lymphadenopathy:     Cervical: No cervical adenopathy.  Skin:    Findings: No erythema or rash.  Neurological:     Cranial Nerves: No cranial nerve deficit.     Motor: No abnormal muscle tone.     Coordination: Coordination normal.     Deep Tendon Reflexes: Reflexes normal.  Psychiatric:        Behavior: Behavior normal.        Thought Content: Thought content normal.        Judgment: Judgment normal.     Lab Results  Component Value Date   WBC 6.6 12/24/2022   HGB 10.4 (A) 12/24/2022   HCT 32 (A) 12/24/2022   PLT 315 12/24/2022   GLUCOSE 91 12/14/2021   CHOL 187 06/07/2021   TRIG 70.0 06/07/2021   HDL 45.00 06/07/2021   LDLCALC 128 (H) 06/07/2021   ALT 12 12/24/2022   AST 17 12/24/2022   NA 137 12/24/2022   K 4.5 12/24/2022   CL 101 12/24/2022   CREATININE 1.6 (A) 12/24/2022   BUN 21 12/24/2022   CO2 27 (A) 12/24/2022   TSH 1.78 12/24/2022   HGBA1C 6.0 03/11/2014    US THYROID  Result Date: 12/18/2020 CLINICAL DATA:  Goiter.  Left thyroid nodule biopsy on 03/07/2010. EXAM: THYROID ULTRASOUND TECHNIQUE: Ultrasound examination of the thyroid gland and adjacent soft tissues was performed. COMPARISON:  02/23/2010 FINDINGS: Parenchymal Echotexture: Mildly heterogenous Isthmus: 0.3 cm, previously 0.2 cm Right lobe: 5.0 x 1.6 x 1.8 cm, previously 5.1 x 1.3 x 1.9 cm Left lobe: 6.4 x 3.2 x 2.8 cm, previously 5.4 x 2.2 x 2.5 cm _________________________________________________________ Estimated total number of nodules >/= 1 cm: 2 Number of spongiform nodules >/=  2 cm not described below (TR1): 0 Number of mixed cystic  and solid nodules >/= 1.5 cm not described below (TR2): 0 _________________________________________________________ Nodule # 4: Location: Right; Inferior Maximum size: 0.8 cm; Other 2 dimensions: 0.8 x 0.8 cm Composition: cannot determine (2) Echogenicity: hypoechoic (2) Shape: not taller-than-wide (0) Margins: ill-defined (0) Echogenic foci: none (0) ACR TI-RADS total points: 4. ACR TI-RADS risk category: TR4 (4-6 points).  ACR TI-RADS recommendations: Given size (<0.9 cm) and appearance, this nodule does NOT meet TI-RADS criteria for biopsy or dedicated follow-up. _________________________________________________________ Nodule #5 is a primarily cystic nodule in the left superior thyroid lobe that measures 1.0 x 0.6 x 1.1 cm. Nodule #6 is the previously biopsied nodule in the left inferior thyroid nodule. This nodule measures 4.5 x 2.6 x 3.1 cm and previously measured 3.9 x 1.8 x 2.7 cm. This nodule is heterogeneous and slightly hypoechoic. Morphology has not changed since 2011. IMPRESSION: 1. Multinodular goiter. 2. Previously biopsied nodule in the left inferior thyroid lobe has slightly enlarged. Morphology of this nodule has not significantly changed since 2011. 3. No new suspicious thyroid nodules. The above is in keeping with the ACR TI-RADS recommendations - J Am Coll Radiol 2017;14:587-595. Electronically Signed   By: Richarda Overlie M.D.   On: 12/18/2020 15:30    Assessment & Plan:   Problem List Items Addressed This Visit     Obesity    BMI 45, HTN, OA BCBS not covering Wegovy      OSA on CPAP    Not using a CPAP machine Pulm ref to re-start CPAP      Relevant Orders   Ambulatory referral to Pulmonology   Essential hypertension - Primary    Chronic Coreg, Spironolactone, Doxazosin, Losartan       Relevant Orders   TSH   Urinalysis   CBC with Differential/Platelet   Lipid panel   Comprehensive metabolic panel   Chronic renal insufficiency, stage 3 (moderate) (HCC)    Monitor  GFR Hydrate well      Relevant Orders   TSH   Urinalysis   CBC with Differential/Platelet   Lipid panel   Comprehensive metabolic panel   Acute bronchitis    Resolved      URI (upper respiratory infection)   Other Visit Diagnoses     Well adult exam       Relevant Orders   TSH   Urinalysis   CBC with Differential/Platelet   Lipid panel   Comprehensive metabolic panel         No orders of the defined types were placed in this encounter.     Follow-up: Return in about 6 months (around 01/12/2024) for Wellness Exam.  Sonda Primes, MD

## 2023-07-15 NOTE — Assessment & Plan Note (Signed)
Monitor GFR Hydrate well 

## 2023-07-15 NOTE — Assessment & Plan Note (Signed)
BMI 45, HTN, OA BCBS not covering [facility]

## 2023-08-04 DIAGNOSIS — Z1231 Encounter for screening mammogram for malignant neoplasm of breast: Secondary | ICD-10-CM | POA: Diagnosis not present

## 2023-08-04 DIAGNOSIS — Z6841 Body Mass Index (BMI) 40.0 and over, adult: Secondary | ICD-10-CM | POA: Diagnosis not present

## 2023-08-04 DIAGNOSIS — Z01419 Encounter for gynecological examination (general) (routine) without abnormal findings: Secondary | ICD-10-CM | POA: Diagnosis not present

## 2023-08-06 DIAGNOSIS — Z23 Encounter for immunization: Secondary | ICD-10-CM | POA: Diagnosis not present

## 2023-08-07 ENCOUNTER — Other Ambulatory Visit: Payer: Self-pay | Admitting: Obstetrics and Gynecology

## 2023-08-07 DIAGNOSIS — R928 Other abnormal and inconclusive findings on diagnostic imaging of breast: Secondary | ICD-10-CM

## 2023-08-14 ENCOUNTER — Ambulatory Visit
Admission: RE | Admit: 2023-08-14 | Discharge: 2023-08-14 | Disposition: A | Discharge status: Home / Self Care | Payer: Medicare Other | Source: Ambulatory Visit | Attending: Obstetrics and Gynecology | Admitting: Obstetrics and Gynecology

## 2023-08-14 DIAGNOSIS — R928 Other abnormal and inconclusive findings on diagnostic imaging of breast: Secondary | ICD-10-CM | POA: Diagnosis not present

## 2023-08-14 LAB — HM MAMMOGRAPHY

## 2023-08-19 DIAGNOSIS — N1832 Chronic kidney disease, stage 3b: Secondary | ICD-10-CM | POA: Diagnosis not present

## 2023-08-19 DIAGNOSIS — I129 Hypertensive chronic kidney disease with stage 1 through stage 4 chronic kidney disease, or unspecified chronic kidney disease: Secondary | ICD-10-CM | POA: Diagnosis not present

## 2023-08-19 DIAGNOSIS — D631 Anemia in chronic kidney disease: Secondary | ICD-10-CM | POA: Diagnosis not present

## 2023-08-19 DIAGNOSIS — E669 Obesity, unspecified: Secondary | ICD-10-CM | POA: Diagnosis not present

## 2023-08-19 DIAGNOSIS — N390 Urinary tract infection, site not specified: Secondary | ICD-10-CM | POA: Diagnosis not present

## 2023-08-20 LAB — LAB REPORT - SCANNED: EGFR: 47

## 2023-09-04 DIAGNOSIS — K219 Gastro-esophageal reflux disease without esophagitis: Secondary | ICD-10-CM | POA: Diagnosis not present

## 2023-09-04 DIAGNOSIS — N958 Other specified menopausal and perimenopausal disorders: Secondary | ICD-10-CM | POA: Diagnosis not present

## 2023-09-04 DIAGNOSIS — Z13820 Encounter for screening for osteoporosis: Secondary | ICD-10-CM | POA: Diagnosis not present

## 2023-09-09 ENCOUNTER — Other Ambulatory Visit: Payer: Self-pay | Admitting: Internal Medicine

## 2023-10-06 ENCOUNTER — Encounter: Payer: Self-pay | Admitting: Primary Care

## 2023-10-06 ENCOUNTER — Ambulatory Visit: Payer: Medicare Other | Admitting: Primary Care

## 2023-10-06 VITALS — BP 158/75 | HR 61 | Temp 97.8°F | Ht 65.0 in | Wt 275.8 lb

## 2023-10-06 DIAGNOSIS — R0683 Snoring: Secondary | ICD-10-CM | POA: Diagnosis not present

## 2023-10-06 DIAGNOSIS — Z8669 Personal history of other diseases of the nervous system and sense organs: Secondary | ICD-10-CM | POA: Diagnosis not present

## 2023-10-06 NOTE — Progress Notes (Signed)
@Patient  ID: Jennifer Gibson, female    DOB: 08-29-1951, 72 y.o.   MRN: [id number]  No chief complaint on file.   Referring provider: Plotnikov, Georgina Quint, MD  HPI: 72 year old female, never smoked.  Past medical history significant for OSA on CPAP, acute bronchitis, GERD, thyroid nodule, chronic kidney sufficiency, COVID-19, obesity, insomnia.   10/06/2023 Discussed the use of AI scribe software for clinical note transcription with the patient, who gave verbal consent to proceed.  History of Present Illness   The patient, with a history of sleep apnea, presents for a re-evaluation of her condition. She reports having undergone a sleep study several years ago, which resulted in the prescription of a CPAP machine. However, she no longer has the machine and has not been on therapy for an extended period.  The patient admits to snoring, which she attributes to her weight, but denies any episodes of gasping for air. She occasionally wakes up from her sleep due to vivid dreams but does not feel excessively tired during the day. She denies any daytime napping unless she has been particularly active or the weather is cold.  The patient's weight has remained relatively stable, fluctuating by no more than five to ten pounds. She has not noticed any significant changes in her sleep quality or daytime energy levels since discontinuing CPAP therapy.  The patient also reports an onset of sleep talking, which began after the death of her spouse five years ago. She occasionally wakes up from her sleep due to this. However, she denies any episodes of sleepwalking or other potentially dangerous sleep behaviors.     Sleep questionnaire Symptoms-   snoring  Prior sleep study- yes, unsure date Bedtime- 12am-1am Time to fall asleep- 10-76min Nocturnal awakenings- once Out of bed/start of day- 9-10am Weight changes- 10 lbs  Do you operate heavy machinery- No; Drives  Do you currently wear CPAP- no Do you  current wear oxygen- no Epworth- 7    Allergies  Allergen Reactions   Grass Pollen(K-O-[id number]-T-Swt Vern)    Penicillins     REACTION: yeast infection    Immunization History  Administered Date(s) Administered   Fluad Quad(high Dose 65+) 07/01/2019   Influenza, High Dose Seasonal PF 12/31/2018, 08/31/2020, 09/14/2022   Influenza,inj,Quad PF,6+ Mos 08/02/2014, 08/01/2015   Influenza-Unspecified 10/30/2016, 08/29/2017   PFIZER(Purple Top)SARS-COV-2 Vaccination 12/17/2019, 01/11/2020, 10/18/2020   Pfizer(Comirnaty)Fall Seasonal Vaccine 12 years and older 09/14/2022   Pneumococcal Conjugate-13 08/02/2014, 08/29/2017   Pneumococcal Polysaccharide-23 09/01/2014   Td 11/06/1992, 06/30/2012   Tdap 04/05/2019   Unspecified SARS-COV-2 Vaccination 12/17/2019, 01/11/2020, 05/28/2020, 10/18/2020   Zoster, Live 10/31/2014    Past Medical History:  Diagnosis Date   Allergy    Anemia    Anxiety    Cataract    Chronic kidney disease    stage 3   Detached retina, right 11/05/2007   Hypertension    Morbid obesity (HCC)    Sleep apnea    doesn't wear CPAP   Venous insufficiency of leg    right foot / ankle -sev.yeras ago    Tobacco History: Social History   Tobacco Use  Smoking Status Never  Smokeless Tobacco Never  Tobacco Comments   alot of second hand smoke   Counseling given: Not Answered Tobacco comments: alot of second hand smoke   Outpatient Medications Prior to Visit  Medication Sig Dispense Refill   Apoaequorin (PREVAGEN PO) Take by mouth daily.     carvedilol (COREG) 25 MG tablet Take 1 tablet (  25 mg total) by mouth 2 (two) times daily with a meal. Annual appt IS due must see provider for future refills 180 tablet 3   Cetirizine HCl 10 MG CAPS Take by mouth daily.     Cholecalciferol (VITAMIN D3) 50 MCG (2000 UT) capsule Take 1 capsule (2,000 Units total) by mouth daily. 100 capsule 3   escitalopram (LEXAPRO) 5 MG tablet Take 1 tablet (5 mg total) by mouth daily. 90  tablet 3   losartan (COZAAR) 25 MG tablet TAKE 1 TABLET TWICE A DAY Annual appt is due in must see provider for future refills 180 tablet 3   Multiple Vitamin (MULTIVITAMIN) capsule Take 1 capsule by mouth daily.     Omega-3 Fatty Acids (OMEGA 3 PO) Take 1 capsule by mouth daily. Mega red     pantoprazole (PROTONIX) 40 MG tablet TAKE 1 TABLET DAILY. ANNUAL APPOINTMENT DUE AUGUST MUST SEE PROVIDER FOR FUTURE REFILLS 90 tablet 3   phentermine (ADIPEX-P) 37.5 MG tablet Take 1 tablet (37.5 mg total) by mouth daily before breakfast. 30 tablet 2   promethazine-dextromethorphan (PROMETHAZINE-DM) 6.25-15 MG/5ML syrup Take 5 mLs by mouth 4 (four) times daily as needed for cough. 240 mL 0   spironolactone-hydrochlorothiazide (ALDACTAZIDE) 25-25 MG tablet Take 2 tablets by mouth daily. (Patient not taking: Reported on 07/15/2023) 180 tablet 3   No facility-administered medications prior to visit.   Review of Systems  Review of Systems  Constitutional: Negative.  Negative for fatigue.  Psychiatric/Behavioral:  Positive for sleep disturbance.    Physical Exam  There were no vitals taken for this visit. Physical Exam Constitutional:      Appearance: Normal appearance. She is obese.  HENT:     Head: Normocephalic and atraumatic.     Mouth/Throat:     Mouth: Mucous membranes are moist.     Pharynx: Oropharynx is clear.  Cardiovascular:     Rate and Rhythm: Normal rate and regular rhythm.  Pulmonary:     Effort: Pulmonary effort is normal.     Breath sounds: Normal breath sounds.  Musculoskeletal:        General: Normal range of motion.  Skin:    General: Skin is warm and dry.  Neurological:     General: No focal deficit present.     Mental Status: She is alert and oriented to person, place, and time. Mental status is at baseline.  Psychiatric:        Mood and Affect: Mood normal.        Behavior: Behavior normal.        Thought Content: Thought content normal.        Judgment: Judgment  normal.      Lab Results:  CBC    Component Value Date/Time   WBC 6.6 12/24/2022 0000   WBC 6.1 12/14/2021 0917   RBC 3.96 12/24/2022 0000   HGB 10.4 (A) 12/24/2022 0000   HCT 32 (A) 12/24/2022 0000   PLT 315 12/24/2022 0000   MCV 81.4 12/14/2021 0917   MCH 26.2 (L) 05/29/2020 1453   MCHC 32.0 12/14/2021 0917   RDW 13.6 12/14/2021 0917   LYMPHSABS 1.6 12/14/2021 0917   MONOABS 0.6 12/14/2021 0917   EOSABS 0.2 12/14/2021 0917   BASOSABS 0.0 12/14/2021 0917    BMET    Component Value Date/Time   NA 137 12/24/2022 0000   K 4.5 12/24/2022 0000   CL 101 12/24/2022 0000   CO2 27 (A) 12/24/2022 0000   GLUCOSE 91 12/14/2021  0917   BUN 21 12/24/2022 0000   CREATININE 1.6 (A) 12/24/2022 0000   CREATININE 1.37 (H) 12/14/2021 0917   CREATININE 1.46 (H) 05/29/2020 1453   CALCIUM 10.3 12/24/2022 0000   GFRNONAA 36 (L) 05/29/2020 1453   GFRAA 42 (L) 05/29/2020 1453    BNP No results found for: "BNP"  ProBNP No results found for: "PROBNP"  Imaging: No results found.   Assessment & Plan:   1. Loud snoring  2. Hx of sleep apnea  Sleep Apnea History of sleep apnea with prior CPAP use, but therapy has been discontinued for several years. Reports loud snoring but does not wake up gasping. Denies significant daytime fatigue. Epworth scale is 7/24. No history of sleepwalking, acting out dreams, or sudden sleep attacks.  Reviewed risks of untreated sleep apnea including cardiac arrhythmias, pulmonary hypertension, diabetes and stroke.  Also discussed treatment options including weight loss, oral appliance, CPAP therapy referral to ENT for possible surgical options. -Order home sleep study to reestablish diagnosis of sleep apnea. -Discuss results and potential treatment options (including CPAP, oral appliance, lifestyle modifications) once sleep study results are available. -Advise patient to contact office via MyChart if no results are received within two weeks of completing the  home sleep study.  Sleep Talking Reports sleep talking, particularly since the death of her husband five years ago. No associated dangerous behaviors or sleepwalking. No specific intervention at this time.  -Monitor for any changes or escalation in behaviors.         Glenford Bayley, NP 10/06/2023

## 2023-10-06 NOTE — Patient Instructions (Signed)

## 2023-10-15 ENCOUNTER — Ambulatory Visit: Payer: Medicare Other | Admitting: Primary Care

## 2023-10-15 DIAGNOSIS — R0683 Snoring: Secondary | ICD-10-CM

## 2023-10-15 DIAGNOSIS — G4733 Obstructive sleep apnea (adult) (pediatric): Secondary | ICD-10-CM | POA: Diagnosis not present

## 2023-10-15 DIAGNOSIS — Z8669 Personal history of other diseases of the nervous system and sense organs: Secondary | ICD-10-CM

## 2023-10-21 DIAGNOSIS — E669 Obesity, unspecified: Secondary | ICD-10-CM | POA: Diagnosis not present

## 2023-10-21 DIAGNOSIS — I129 Hypertensive chronic kidney disease with stage 1 through stage 4 chronic kidney disease, or unspecified chronic kidney disease: Secondary | ICD-10-CM | POA: Diagnosis not present

## 2023-10-21 DIAGNOSIS — D631 Anemia in chronic kidney disease: Secondary | ICD-10-CM | POA: Diagnosis not present

## 2023-10-21 DIAGNOSIS — N1832 Chronic kidney disease, stage 3b: Secondary | ICD-10-CM | POA: Diagnosis not present

## 2023-11-04 NOTE — Progress Notes (Signed)
Please let patient know sleep study showed mild obstructive sleep apnea, average of 11 apneic events per hour with mild oxygen desaturation.  If patient is symptomatic i.e. excessive daytime sleepiness, recommend patient be started on CPAP auto settings 5 to 15 cm H2O with mask of choice or refer to orthodontics for consideration for oral appliance.  Please let me know what she decides.  If she would like to discuss sleep study results and treatment options in greater detail please set up virtual visit.

## 2023-11-28 ENCOUNTER — Other Ambulatory Visit: Payer: Self-pay | Admitting: Internal Medicine

## 2023-11-28 ENCOUNTER — Telehealth (INDEPENDENT_AMBULATORY_CARE_PROVIDER_SITE_OTHER): Payer: Medicare Other | Admitting: Primary Care

## 2023-11-28 DIAGNOSIS — G4730 Sleep apnea, unspecified: Secondary | ICD-10-CM

## 2023-11-28 NOTE — Progress Notes (Signed)
Virtual Visit via Video Note  I connected with Jennifer Gibson on 11/28/23 at  3:00 PM EST by a video enabled telemedicine application and verified that I am speaking with the correct person using two identifiers.  Location: Patient: Home Provider: Office    I discussed the limitations of evaluation and management by telemedicine and the availability of in person appointments. The patient expressed understanding and agreed to proceed.  History of Present Illness:  73 year old female, never smoked.  Past medical history significant for OSA on CPAP, acute bronchitis, GERD, thyroid nodule, chronic kidney sufficiency, COVID-19, obesity, insomnia.  Previous [facility] pulmonary encounter: 10/06/2023 Discussed the use of AI scribe software for clinical note transcription with the patient, who gave verbal consent to proceed.  History of Present Illness   The patient, with a history of sleep apnea, presents for a re-evaluation of her condition. She reports having undergone a sleep study several years ago, which resulted in the prescription of a CPAP machine. However, she no longer has the machine and has not been on therapy for an extended period.  The patient admits to snoring, which she attributes to her weight, but denies any episodes of gasping for air. She occasionally wakes up from her sleep due to vivid dreams but does not feel excessively tired during the day. She denies any daytime napping unless she has been particularly active or the weather is cold.  The patient's weight has remained relatively stable, fluctuating by no more than five to ten pounds. She has not noticed any significant changes in her sleep quality or daytime energy levels since discontinuing CPAP therapy.  The patient also reports an onset of sleep talking, which began after the death of her spouse five years ago. She occasionally wakes up from her sleep due to this. However, she denies any episodes of sleepwalking or other  potentially dangerous sleep behaviors.     Sleep questionnaire Symptoms-   snoring  Prior sleep study- yes, unsure date Bedtime- 12am-1am Time to fall asleep- 10-60min Nocturnal awakenings- once Out of bed/start of day- 9-10am Weight changes- 10 lbs  Do you operate heavy machinery- No; Drives  Do you currently wear CPAP- no Do you current wear oxygen- no Epworth- 7  11/28/2023 Discussed the use of AI scribe software for clinical note transcription with the patient, who gave verbal consent to proceed.  Patient contacted today to review sleep study results. She was seen for consult in December. She has a history of sleep apnea. The patient had a sleep study several years ago, which resulted in a CPAP machine prescription. However, she has not used her CPAP for quite some time. The patient reported no significant benefit from the CPAP therapy, as it exacerbated sinus drainage leading to sinus infections.  She has symptoms of snoring. She occasionally wakes up from her sleep due to vivid dreams but does not feel excessively tired during the day.   She underwent repeat home sleep study December 2024 that showed mild sleep apnea, with an average of 11.5 apneic events per hour. The patient's oxygen level dropped to 85% for about seven minutes during the study. The patient expressed interest in an oral appliance as a potential treatment option for her sleep apnea.      Observations/Objective:  Appears well without overt respiratory symptoms  Sleep testing: >> HST 10/15/23 showed mild OSA, average AHI 11.5/hour with SpO2 low 85% (patient spent 7 with O2 <88%).   Assessment and Plan:    Mild Sleep  Apnea Repeat home sleep study shows 11.5 apneic events per hour with oxygen desaturation to 85%. Patient reports snoring and sleep talking, but denies excessive daytime sleepiness or gasping for air. Previous CPAP therapy was not well tolerated due to sinus issues. -Refer to Dr. Irene Limbo or Dr.  Althea Grimmer for evaluation and fitting of an oral appliance. -Advise patient to contact insurance regarding coverage for oral appliance. -Consider conservative management strategies such as weight loss and positional sleep changes if oral appliance is not feasible. -Consider discussing with primary care about potential use of Zepbound for obesity and sleep apnea if weight loss is difficult.  Follow Up Instructions:  -Follow-up in 6 months to reassess symptoms and treatment efficacy.       I discussed the assessment and treatment plan with the patient. The patient was provided an opportunity to ask questions and all were answered. The patient agreed with the plan and demonstrated an understanding of the instructions.   The patient was advised to call back or seek an in-person evaluation if the symptoms worsen or if the condition fails to improve as anticipated.  I provided 22 minutes of non-face-to-face time during this encounter.   Glenford Bayley, NP

## 2023-11-28 NOTE — Patient Instructions (Signed)
-  MILD SLEEP APNEA: Mild sleep apnea is a condition where you experience brief interruptions in breathing during sleep, averaging eleven and a half times per hour in your case. This can lead to symptoms like snoring and sleep talking. We will refer you to Dr. Irene Limbo or Dr. Althea Grimmer for evaluation and fitting of an oral appliance, which may help manage your condition. Please contact your insurance to check if they cover the cost of the oral appliance. If the oral appliance is not feasible, consider conservative management strategies such as weight loss and changing your sleep position. Additionally, you may discuss with your primary care doctor about the potential use of Zepbound for obesity and sleep apnea if weight loss is challenging. We will follow up in 6 months to reassess your symptoms and the effectiveness of the treatment.  INSTRUCTIONS:  Please follow up in 6 months to reassess your symptoms and the effectiveness of the treatment. Contact Dr. Irene Limbo or Dr. Althea Grimmer for evaluation and fitting of an oral appliance. Also, check with your insurance regarding coverage for the oral appliance. Consider discussing with your primary care doctor about the potential use of Zepbound for obesity and sleep apnea if weight loss is challenging.

## 2023-12-16 ENCOUNTER — Other Ambulatory Visit: Payer: Self-pay | Admitting: Internal Medicine

## 2024-01-01 ENCOUNTER — Ambulatory Visit: Payer: Medicare Other

## 2024-01-01 VITALS — Ht 65.0 in | Wt 275.0 lb

## 2024-01-01 DIAGNOSIS — Z0000 Encounter for general adult medical examination without abnormal findings: Secondary | ICD-10-CM | POA: Diagnosis not present

## 2024-01-01 NOTE — Patient Instructions (Signed)
 Jennifer Gibson , Thank you for taking time to come for your Medicare Wellness Visit. I appreciate your ongoing commitment to your health goals. Please review the following plan we discussed and let me know if I can assist you in the future.   Referrals/Orders/Follow-Ups/Clinician Recommendations: It was nice talking to you today.    This is a list of the screening recommended for you and due dates:  Health Maintenance  Topic Date Due   COVID-19 Vaccine (10 - 2024-25 season) 10/01/2023   Mammogram  08/13/2024   Medicare Annual Wellness Visit  12/31/2024   Colon Cancer Screening  08/08/2025   DTaP/Tdap/Td vaccine (4 - Td or Tdap) 04/04/2029   Pneumonia Vaccine  Completed   Flu Shot  Completed   DEXA scan (bone density measurement)  Completed   Hepatitis C Screening  Completed   HPV Vaccine  Aged Out   Zoster (Shingles) Vaccine  Discontinued    Advanced directives: (Declined) Advance directive discussed with you today. Even though you declined this today, please call our office should you change your mind, and we can give you the proper paperwork for you to fill out.  Next Medicare Annual Wellness Visit scheduled for next year: Yes

## 2024-01-01 NOTE — Progress Notes (Addendum)
 Subjective:   Jennifer Gibson is a 73 y.o. who presents for a Medicare Wellness preventive visit.  Visit Complete: Virtual I connected with  [name] on 01/01/24 by a video and audio enabled telemedicine application and verified that I am speaking with the correct person using two identifiers.  Patient Location: Home  Provider Location: Home Office  I discussed the limitations of evaluation and management by telemedicine. The patient expressed understanding and agreed to proceed.  Vital Signs: Because this visit was a virtual/telehealth visit, some criteria may be missing or patient reported. Any vitals not documented were not able to be obtained and vitals that have been documented are patient reported.   AWV Questionnaire: Yes: Patient Medicare AWV questionnaire was completed by the patient on 12/31/2023; I have confirmed that all information answered by patient is correct and no changes since this date.  Cardiac Risk Factors include: advanced age (>75men, >41 women);Other (see comment);hypertension;obesity (BMI >30kg/m2), Risk factor comments: OSA, CRD     Objective:    Today's Vitals   01/01/24 1551  Weight: 275 lb (124.7 kg)  Height: 5\' 5"  (1.651 m)   Body mass index is 45.76 kg/m.     01/01/2024    3:55 PM 06/10/2022   10:53 AM 11/30/2020    1:11 PM 12/29/2017    4:10 PM 02/01/2016    2:09 PM 12/21/2015   12:07 PM  Advanced Directives  Does Patient Have a Medical Advance Directive? No No No No No Yes  Copy of Healthcare Power of Attorney in Chart?      Yes  Would patient like information on creating a medical advance directive?  No - Patient declined Yes (MAU/Ambulatory/Procedural Areas - Information given) Yes (ED - Information included in AVS)      Current Medications (verified) Outpatient Encounter Medications as of 01/01/2024  Medication Sig   Apoaequorin (PREVAGEN PO) Take by mouth daily.   carvedilol (COREG) 25 MG tablet TAKE 1 TABLET TWICE A DAY WITH A MEAL.  ANNUAL APPOINTMENT IS DUE MUST SEE PROVIDER FOR FUTURE REFILLS   Cetirizine HCl 10 MG CAPS Take by mouth daily.   Cholecalciferol (VITAMIN D3) 50 MCG (2000 UT) capsule Take 1 capsule (2,000 Units total) by mouth daily.   escitalopram (LEXAPRO) 5 MG tablet TAKE 1 TABLET DAILY   losartan (COZAAR) 25 MG tablet TAKE 1 TABLET TWICE A DAY   Multiple Vitamin (MULTIVITAMIN) capsule Take 1 capsule by mouth daily.   Omega-3 Fatty Acids (OMEGA 3 PO) Take 1 capsule by mouth daily. Mega red   pantoprazole (PROTONIX) 40 MG tablet TAKE 1 TABLET DAILY. ANNUAL APPOINTMENT DUE AUGUST MUST SEE PROVIDER FOR FUTURE REFILLS   promethazine-dextromethorphan (PROMETHAZINE-DM) 6.25-15 MG/5ML syrup Take 5 mLs by mouth 4 (four) times daily as needed for cough.   dapagliflozin propanediol (FARXIGA) 5 MG TABS tablet Take 5 mg by mouth daily.   No facility-administered encounter medications on file as of 01/01/2024.    Allergies (verified) Grass pollen([id number]-o-[id number]-swt vern) and Penicillins   History: Past Medical History:  Diagnosis Date   Allergy    Anemia    Anxiety    Cataract    Chronic kidney disease    stage 3   Detached retina, right 11/05/2007   Hypertension    Morbid obesity (HCC)    Sleep apnea    doesn't wear CPAP   Venous insufficiency of leg    right foot / ankle -sev.yeras ago   Past Surgical History:  Procedure Laterality Date  ABDOMINAL HYSTERECTOMY     partial   COLONOSCOPY     CYSTECTOMY     left shoulder cyst   POLYPECTOMY     Family History  Problem Relation Age of Onset   Heart disease Father    Hypertension Other    Colon polyps Brother        benign   Colon cancer Neg Hx    Esophageal cancer Neg Hx    Pancreatic cancer Neg Hx    Rectal cancer Neg Hx    Stomach cancer Neg Hx    Social History   Socioeconomic History   Marital status: Widowed    Spouse name: Not on file   Number of children: Not on file   Years of education: Not on file   Highest education level: Some  college, no degree  Occupational History   Occupation: RETIRED  Tobacco Use   Smoking status: Never   Smokeless tobacco: Never   Tobacco comments:    alot of second hand smoke  Vaping Use   Vaping status: Never Used  Substance and Sexual Activity   Alcohol use: No    Alcohol/week: 0.0 standard drinks of alcohol   Drug use: No   Sexual activity: Not on file  Other Topics Concern   Not on file  Social History Narrative   Lives alone-2025   Social Drivers of Health   Financial Resource Strain: Low Risk  (12/31/2023)   Overall Financial Resource Strain (CARDIA)    Difficulty of Paying Living Expenses: Not hard at all  Food Insecurity: No Food Insecurity (12/31/2023)   Hunger Vital Sign    Worried About Running Out of Food in the Last Year: Never true    Ran Out of Food in the Last Year: Never true  Transportation Needs: No Transportation Needs (12/31/2023)   PRAPARE - Administrator, Civil Service (Medical): No    Lack of Transportation (Non-Medical): No  Physical Activity: Insufficiently Active (12/31/2023)   Exercise Vital Sign    Days of Exercise per Week: 1 day    Minutes of Exercise per Session: 20 min  Stress: No Stress Concern Present (12/31/2023)   [employer] of Occupational Health - Occupational Stress Questionnaire    Feeling of Stress : Only a little  Social Connections: Moderately Integrated (12/31/2023)   Social Connection and Isolation Panel [NHANES]    Frequency of Communication with Friends and Family: More than three times a week    Frequency of Social Gatherings with Friends and Family: More than three times a week    Attends Religious Services: More than 4 times per year    Active Member of [employer] or Organizations: Yes    Attends Banker Meetings: More than 4 times per year    Marital Status: Widowed    Tobacco Counseling Counseling given: Not Answered Tobacco comments: alot of second hand smoke    Clinical  Intake:  Pre-visit preparation completed: Yes  Pain : No/denies pain     BMI - recorded: 45.76 Nutritional Status: BMI > 30  Obese Nutritional Risks: None  How often do you need to have someone help you when you read instructions, pamphlets, or other written materials from your doctor or pharmacy?: 1 - Never  Interpreter Needed?: No  Information entered by :: Berthe Oley, RMA   Activities of Daily Living     01/01/2024    3:52 PM  In your present state of health, do you have any difficulty  performing the following activities:  Hearing? 0  Vision? 0  Difficulty concentrating or making decisions? 0  Walking or climbing stairs? 0  Dressing or bathing? 0  Doing errands, shopping? 0  Preparing Food and eating ? N  Using the Toilet? N  Managing your Medications? N  Managing your Finances? N  Housekeeping or managing your Housekeeping? N    Patient Care Team: Plotnikov, Georgina Quint, MD as PCP - General Candice Camp, MD as Registered Nurse (Obstetrics and Gynecology) Meryl Dare, MD (Inactive) (Gastroenterology)  Indicate any recent Medical Services you may have received from other than [facility] providers in the past year (date may be approximate).     Assessment:   This is a routine wellness examination for [place].  Hearing/Vision screen Hearing Screening - Comments:: Denies hearing difficulties   Vision Screening - Comments:: Wears eyeglasses   Goals Addressed             This Visit's Progress    Patient Stated   On track    Increase physical activity to maintain strength and keep my heart strong. Continue to worship God, love family, and enjoy life.       Depression Screen     01/01/2024    4:01 PM 05/14/2023    9:14 AM 11/07/2022    4:10 PM 06/10/2022   10:54 AM 06/10/2022   10:52 AM 12/14/2021    8:40 AM 11/30/2020    1:09 PM  PHQ 2/9 Scores  PHQ - 2 Score 0 0 0 0 0 2 0  PHQ- 9 Score 0          Fall Risk     01/01/2024    3:56 PM 10/06/2023   11:30  AM 05/14/2023    9:13 AM 11/13/2022    7:55 AM 11/07/2022    4:10 PM  Fall Risk   Falls in the past year? 0 0 0 0 0  Number falls in past yr: 0  0 0 0  Injury with Fall? 0  0 0 0  Risk for fall due to : No Fall Risks  No Fall Risks No Fall Risks No Fall Risks  Follow up Falls prevention discussed;Falls evaluation completed  Falls evaluation completed Falls evaluation completed Falls evaluation completed    MEDICARE RISK AT HOME:  Medicare Risk at Home Any stairs in or around the home?: Yes (front and side of the house.) If so, are there any without handrails?: No Home free of loose throw rugs in walkways, pet beds, electrical cords, etc?: Yes Adequate lighting in your home to reduce risk of falls?: Yes Life alert?: No Use of a cane, walker or w/c?: No Grab bars in the bathroom?: Yes Shower chair or bench in shower?: Yes Elevated toilet seat or a handicapped toilet?: Yes  TIMED UP AND GO:  Was the test performed?  No  Cognitive Function: 6CIT completed    12/21/2015   12:19 PM  MMSE - Mini Mental State Exam  Not completed: --        01/01/2024    3:58 PM 06/10/2022   11:02 AM  6CIT Screen  What Year? 0 points 0 points  What month? 0 points 0 points  What time? 0 points 0 points  Count back from 20 0 points 0 points  Months in reverse 0 points 0 points  Repeat phrase 0 points 0 points  Total Score 0 points 0 points    Immunizations Immunization History  Administered Date(s) Administered  Fluad Quad(high Dose 65+) 07/01/2019   Influenza, High Dose Seasonal PF 12/31/2018, 08/31/2020, 09/14/2022   Influenza,inj,Quad PF,6+ Mos 08/02/2014, 08/01/2015   Influenza-Unspecified 10/30/2016, 08/29/2017   PFIZER(Purple Top)SARS-COV-2 Vaccination 12/17/2019, 01/11/2020, 10/18/2020   Pfizer(Comirnaty)Fall Seasonal Vaccine 12 years and older 09/14/2022   Pneumococcal Conjugate-13 08/02/2014, 08/29/2017   Pneumococcal Polysaccharide-23 09/01/2014   Td 11/06/1992, 06/30/2012    Tdap 04/05/2019   Unspecified SARS-COV-2 Vaccination 12/17/2019, 01/11/2020, 05/28/2020, 10/18/2020   Zoster, Live 10/31/2014    Screening Tests Health Maintenance  Topic Date Due   MAMMOGRAM  07/06/2022   COVID-19 Vaccine (9 - 2024-25 season) 07/06/2023   Medicare Annual Wellness (AWV)  12/31/2024   Colonoscopy  08/08/2025   DTaP/Tdap/Td (4 - Td or Tdap) 04/04/2029   Pneumonia Vaccine 75+ Years old  Completed   INFLUENZA VACCINE  Completed   DEXA SCAN  Completed   Hepatitis C Screening  Completed   HPV VACCINES  Aged Out   Zoster Vaccines- Shingrix  Discontinued    Health Maintenance  Health Maintenance Due  Topic Date Due   MAMMOGRAM  07/06/2022   COVID-19 Vaccine (9 - 2024-25 season) 07/06/2023   Health Maintenance Items Addressed: See Nurse Notes  Additional Screening:  Vision Screening: Recommended annual ophthalmology exams for early detection of glaucoma and other disorders of the eye.  Dental Screening: Recommended annual dental exams for proper oral hygiene  Community Resource Referral / Chronic Care Management: CRR required this visit?  No   CCM required this visit?  No     Plan:     I have personally reviewed and noted the following in the patient's chart:   Medical and social history Use of alcohol, tobacco or illicit drugs  Current medications and supplements including opioid prescriptions. Patient is not currently taking opioid prescriptions. Functional ability and status Nutritional status Physical activity Advanced directives List of other physicians Hospitalizations, surgeries, and ER visits in previous 12 months Vitals Screenings to include cognitive, depression, and falls Referrals and appointments  In addition, I have reviewed and discussed with patient certain preventive protocols, quality metrics, and best practice recommendations. A written personalized care plan for preventive services as well as general preventive health  recommendations were provided to patient.     Tonilynn Bieker L Irva Loser, CMA   01/01/2024   After Visit Summary: (MyChart) Due to this being a telephonic visit, the after visit summary with patients personalized plan was offered to patient via MyChart   Notes: Please refer to Routing Comments.  Medical screening examination/treatment/procedure(s) were performed by non-physician practitioner and as supervising physician I was immediately available for consultation/collaboration.  I agree with above. Jacinta Shoe, MD

## 2024-01-12 DIAGNOSIS — D631 Anemia in chronic kidney disease: Secondary | ICD-10-CM | POA: Diagnosis not present

## 2024-01-12 DIAGNOSIS — E669 Obesity, unspecified: Secondary | ICD-10-CM | POA: Diagnosis not present

## 2024-01-12 DIAGNOSIS — N1832 Chronic kidney disease, stage 3b: Secondary | ICD-10-CM | POA: Diagnosis not present

## 2024-01-12 DIAGNOSIS — I129 Hypertensive chronic kidney disease with stage 1 through stage 4 chronic kidney disease, or unspecified chronic kidney disease: Secondary | ICD-10-CM | POA: Diagnosis not present

## 2024-01-15 ENCOUNTER — Encounter: Payer: Self-pay | Admitting: Internal Medicine

## 2024-01-15 ENCOUNTER — Ambulatory Visit (INDEPENDENT_AMBULATORY_CARE_PROVIDER_SITE_OTHER): Payer: Medicare Other | Admitting: Internal Medicine

## 2024-01-15 VITALS — BP 118/80 | HR 85 | Temp 97.9°F | Ht 65.0 in | Wt 269.0 lb

## 2024-01-15 DIAGNOSIS — D649 Anemia, unspecified: Secondary | ICD-10-CM

## 2024-01-15 DIAGNOSIS — F419 Anxiety disorder, unspecified: Secondary | ICD-10-CM | POA: Diagnosis not present

## 2024-01-15 DIAGNOSIS — Z6841 Body Mass Index (BMI) 40.0 and over, adult: Secondary | ICD-10-CM

## 2024-01-15 DIAGNOSIS — N183 Chronic kidney disease, stage 3 unspecified: Secondary | ICD-10-CM

## 2024-01-15 DIAGNOSIS — K219 Gastro-esophageal reflux disease without esophagitis: Secondary | ICD-10-CM | POA: Diagnosis not present

## 2024-01-15 DIAGNOSIS — I10 Essential (primary) hypertension: Secondary | ICD-10-CM

## 2024-01-15 DIAGNOSIS — E66813 Obesity, class 3: Secondary | ICD-10-CM

## 2024-01-15 NOTE — Progress Notes (Signed)
 Subjective:  Patient ID: Jennifer Gibson, female    DOB: Apr 17, 1951  Age: 73 y.o. MRN: [id number]  CC: Medical Management of Chronic Issues (6 MNTH F/U)   HPI Jennifer Gibson presents for HTN, anxiety, GERD, CRI. Just had labs w/Nephrology  Outpatient Medications Prior to Visit  Medication Sig Dispense Refill   Apoaequorin (PREVAGEN PO) Take by mouth daily.     carvedilol (COREG) 25 MG tablet TAKE 1 TABLET TWICE A DAY WITH A MEAL. ANNUAL APPOINTMENT IS DUE MUST SEE PROVIDER FOR FUTURE REFILLS 180 tablet 3   Cetirizine HCl 10 MG CAPS Take by mouth daily.     Cholecalciferol (VITAMIN D3) 50 MCG (2000 UT) capsule Take 1 capsule (2,000 Units total) by mouth daily. 100 capsule 3   escitalopram (LEXAPRO) 5 MG tablet TAKE 1 TABLET DAILY 90 tablet 3   losartan (COZAAR) 25 MG tablet TAKE 1 TABLET TWICE A DAY 180 tablet 3   Multiple Vitamin (MULTIVITAMIN) capsule Take 1 capsule by mouth daily.     Omega-3 Fatty Acids (OMEGA 3 PO) Take 1 capsule by mouth daily. Mega red     pantoprazole (PROTONIX) 40 MG tablet TAKE 1 TABLET DAILY. ANNUAL APPOINTMENT DUE AUGUST MUST SEE PROVIDER FOR FUTURE REFILLS 90 tablet 3   promethazine-dextromethorphan (PROMETHAZINE-DM) 6.25-15 MG/5ML syrup Take 5 mLs by mouth 4 (four) times daily as needed for cough. 240 mL 0   JARDIANCE 25 MG TABS tablet Take 25 mg by mouth daily.     dapagliflozin propanediol (FARXIGA) 5 MG TABS tablet Take 5 mg by mouth daily.     No facility-administered medications prior to visit.    ROS: Review of Systems  Constitutional:  Negative for activity change, appetite change, chills, fatigue and unexpected weight change.  HENT:  Positive for congestion and postnasal drip. Negative for mouth sores and sinus pressure.   Eyes:  Negative for visual disturbance.  Respiratory:  Negative for cough and chest tightness.   Gastrointestinal:  Negative for abdominal pain and nausea.  Genitourinary:  Negative for difficulty urinating, frequency and  vaginal pain.  Musculoskeletal:  Negative for back pain and gait problem.  Skin:  Negative for pallor and rash.  Neurological:  Negative for dizziness, tremors, weakness, numbness and headaches.  Psychiatric/Behavioral:  Negative for confusion and sleep disturbance.     Objective:  BP 118/80   Pulse 85   Temp 97.9 F (36.6 C) (Oral)   Ht 5\' 5"  (1.651 m)   Wt 269 lb (122 kg)   SpO2 91%   BMI 44.76 kg/m   BP Readings from Last 3 Encounters:  01/15/24 118/80  10/06/23 (!) 158/75  07/15/23 118/62    Wt Readings from Last 3 Encounters:  01/15/24 269 lb (122 kg)  01/01/24 275 lb (124.7 kg)  10/06/23 275 lb 12.8 oz (125.1 kg)    Physical Exam Constitutional:      General: She is not in acute distress.    Appearance: She is well-developed. She is obese.  HENT:     Head: Normocephalic.     Right Ear: External ear normal.     Left Ear: External ear normal.     Nose: Nose normal.  Eyes:     General:        Right eye: No discharge.        Left eye: No discharge.     Conjunctiva/sclera: Conjunctivae normal.     Pupils: Pupils are equal, round, and reactive to light.  Neck:  Thyroid: No thyromegaly.     Vascular: No JVD.     Trachea: No tracheal deviation.  Cardiovascular:     Rate and Rhythm: Normal rate and regular rhythm.     Heart sounds: Normal heart sounds.  Pulmonary:     Effort: No respiratory distress.     Breath sounds: No stridor. No wheezing.  Abdominal:     General: Bowel sounds are normal. There is no distension.     Palpations: Abdomen is soft. There is no mass.     Tenderness: There is no abdominal tenderness. There is no guarding or rebound.  Musculoskeletal:        General: No tenderness.     Cervical back: Normal range of motion and neck supple. No rigidity.  Lymphadenopathy:     Cervical: No cervical adenopathy.  Skin:    Findings: No erythema or rash.  Neurological:     Mental Status: She is oriented to person, place, and time.     Cranial  Nerves: No cranial nerve deficit.     Motor: No abnormal muscle tone.     Coordination: Coordination normal.     Gait: Gait normal.     Deep Tendon Reflexes: Reflexes normal.  Psychiatric:        Behavior: Behavior normal.        Thought Content: Thought content normal.        Judgment: Judgment normal.     Lab Results  Component Value Date   WBC 6.6 12/24/2022   HGB 10.4 (A) 12/24/2022   HCT 32 (A) 12/24/2022   PLT 315 12/24/2022   GLUCOSE 91 12/14/2021   CHOL 187 06/07/2021   TRIG 70.0 06/07/2021   HDL 45.00 06/07/2021   LDLCALC 128 (H) 06/07/2021   ALT 12 12/24/2022   AST 17 12/24/2022   NA 137 12/24/2022   K 4.5 12/24/2022   CL 101 12/24/2022   CREATININE 1.6 (A) 12/24/2022   BUN 21 12/24/2022   CO2 27 (A) 12/24/2022   TSH 1.78 12/24/2022   HGBA1C 6.0 03/11/2014    MM 3D DIAGNOSTIC MAMMOGRAM UNILATERAL LEFT BREAST Result Date: 08/14/2023 CLINICAL DATA:  Recalled from screening for left breast asymmetry. EXAM: DIGITAL DIAGNOSTIC UNILATERAL LEFT MAMMOGRAM WITH TOMOSYNTHESIS AND CAD; ULTRASOUND LEFT BREAST LIMITED TECHNIQUE: Left digital diagnostic mammography and breast tomosynthesis was performed. The images were evaluated with computer-aided detection. ; Targeted ultrasound examination of the left breast was performed. COMPARISON:  Previous exam(s). ACR Breast Density Category c: The breasts are heterogeneously dense, which may obscure small masses. FINDINGS: Questioned asymmetry within the anterior left breast partially effaced with additional imaging suggestive of dense fibroglandular tissue. Targeted ultrasound is performed, showing normal tissue without suspicious mass within the retroareolar left breast. IMPRESSION: No mammographic evidence for malignancy. RECOMMENDATION: Screening mammogram in one year.(Code:SM-B-01Y) I have discussed the findings and recommendations with the patient. If applicable, a reminder letter will be sent to the patient regarding the next  appointment. BI-RADS CATEGORY  1: Negative. Electronically Signed   By: Annia Belt M.D.   On: 08/14/2023 08:47   [place] LIMITED ULTRASOUND INCLUDING AXILLA LEFT BREAST  Result Date: 08/14/2023 CLINICAL DATA:  Recalled from screening for left breast asymmetry. EXAM: DIGITAL DIAGNOSTIC UNILATERAL LEFT MAMMOGRAM WITH TOMOSYNTHESIS AND CAD; ULTRASOUND LEFT BREAST LIMITED TECHNIQUE: Left digital diagnostic mammography and breast tomosynthesis was performed. The images were evaluated with computer-aided detection. ; Targeted ultrasound examination of the left breast was performed. COMPARISON:  Previous exam(s). ACR Breast Density Category c:  The breasts are heterogeneously dense, which may obscure small masses. FINDINGS: Questioned asymmetry within the anterior left breast partially effaced with additional imaging suggestive of dense fibroglandular tissue. Targeted ultrasound is performed, showing normal tissue without suspicious mass within the retroareolar left breast. IMPRESSION: No mammographic evidence for malignancy. RECOMMENDATION: Screening mammogram in one year.(Code:SM-B-01Y) I have discussed the findings and recommendations with the patient. If applicable, a reminder letter will be sent to the patient regarding the next appointment. BI-RADS CATEGORY  1: Negative. Electronically Signed   By: Annia Belt M.D.   On: 08/14/2023 08:47    Assessment & Plan:   Problem List Items Addressed This Visit     Anemia - Primary    Chronic -possibly?renal insufficiency anemia Iron tests - nl      Anxiety disorder   Lexapro - low dose      Chronic renal insufficiency, stage 3 (moderate) (HCC)   Monitor GFR Hydrate well      Essential hypertension   Chronic Coreg, Spironolactone, Doxazosin, Losartan       GERD (gastroesophageal reflux disease)   Cont w/Protonix      Obesity   Better Lost wt         No orders of the defined types were placed in this encounter.     Follow-up: Return in  about 6 months (around 07/17/2024) for Wellness Exam, a follow-up visit.  Sonda Primes, MD

## 2024-01-15 NOTE — Patient Instructions (Signed)
)   Shingrix   ) RSV

## 2024-01-15 NOTE — Assessment & Plan Note (Signed)
Better Lost wt 

## 2024-01-15 NOTE — Assessment & Plan Note (Signed)
Monitor GFR Hydrate well 

## 2024-01-15 NOTE — Assessment & Plan Note (Signed)
Chronic Coreg, Spironolactone, Doxazosin, Losartan 

## 2024-01-15 NOTE — Assessment & Plan Note (Signed)
Cont w/Protonix 

## 2024-01-15 NOTE — Assessment & Plan Note (Signed)
  Chronic -possibly?renal insufficiency anemia Iron tests - nl

## 2024-01-15 NOTE — Assessment & Plan Note (Signed)
Lexapro - low dose 

## 2024-03-22 ENCOUNTER — Encounter: Payer: Self-pay | Admitting: Internal Medicine

## 2024-03-23 ENCOUNTER — Other Ambulatory Visit: Payer: Self-pay

## 2024-03-23 DIAGNOSIS — I10 Essential (primary) hypertension: Secondary | ICD-10-CM

## 2024-03-23 MED ORDER — SPIRONOLACTONE-HCTZ 25-25 MG PO TABS: 1.0000 | ORAL_TABLET | Freq: Every day | ORAL | Status: AC

## 2024-04-05 ENCOUNTER — Telehealth: Payer: Self-pay | Admitting: Internal Medicine

## 2024-04-05 MED ORDER — SPIRONOLACTONE-HCTZ 25-25 MG PO TABS: 1.0000 | ORAL_TABLET | Freq: Every day | ORAL | 1 refills | Status: DC

## 2024-04-05 NOTE — Telephone Encounter (Signed)
 Copied from CRM [id number]. Topic: Clinical - Medication Refill >> Apr 05, 2024  8:10 AM Jennifer Gibson wrote: Medication: spironolactone -hydrochlorothiazide (ALDACTAZIDE ) 25-25 MG per tablet 1 tablet    Has the patient contacted their pharmacy? Yes (Agent: If no, request that the patient contact the pharmacy for the refill. If patient does not wish to contact the pharmacy document the reason why and proceed with request.) (Agent: If yes, when and what did the pharmacy advise?)  This is the patient's preferred pharmacy:   [facility] [phone] [place], [place] - [address] [place] [address] Phone: [phone] Fax: [phone]  Is this the correct pharmacy for this prescription? Yes If no, delete pharmacy and type the correct one.   Has the prescription been filled recently? Yes  Is the patient out of the medication? Yes  Has the patient been seen for an appointment in the last year OR does the patient have an upcoming appointment? Yes  Can we respond through MyChart? Yes  Agent: Please be advised that Rx refills may take up to 3 business days. We ask that you follow-up with your pharmacy.

## 2024-05-25 ENCOUNTER — Encounter: Payer: Self-pay | Admitting: Internal Medicine

## 2024-05-27 ENCOUNTER — Other Ambulatory Visit: Payer: Self-pay | Admitting: Internal Medicine

## 2024-05-27 MED ORDER — SPIRONOLACTONE-HCTZ 25-25 MG PO TABS: 2.0000 | ORAL_TABLET | Freq: Every day | ORAL | 2 refills | Status: AC

## 2024-07-15 DIAGNOSIS — I129 Hypertensive chronic kidney disease with stage 1 through stage 4 chronic kidney disease, or unspecified chronic kidney disease: Secondary | ICD-10-CM | POA: Diagnosis not present

## 2024-07-15 DIAGNOSIS — E669 Obesity, unspecified: Secondary | ICD-10-CM | POA: Diagnosis not present

## 2024-07-15 DIAGNOSIS — N1832 Chronic kidney disease, stage 3b: Secondary | ICD-10-CM | POA: Diagnosis not present

## 2024-07-16 LAB — LAB REPORT - SCANNED
Creatinine, POC: 152.4 mg/dL
EGFR: 40

## 2024-07-19 ENCOUNTER — Ambulatory Visit: Admitting: Internal Medicine

## 2024-08-05 DIAGNOSIS — Z6841 Body Mass Index (BMI) 40.0 and over, adult: Secondary | ICD-10-CM | POA: Diagnosis not present

## 2024-08-05 DIAGNOSIS — Z1231 Encounter for screening mammogram for malignant neoplasm of breast: Secondary | ICD-10-CM | POA: Diagnosis not present

## 2024-08-05 DIAGNOSIS — Z01419 Encounter for gynecological examination (general) (routine) without abnormal findings: Secondary | ICD-10-CM | POA: Diagnosis not present

## 2024-10-04 ENCOUNTER — Ambulatory Visit: Admitting: Primary Care

## 2024-10-04 ENCOUNTER — Encounter: Payer: Self-pay | Admitting: Primary Care

## 2024-10-04 VITALS — BP 126/64 | HR 80 | Temp 97.6°F | Ht 65.0 in | Wt 274.4 lb

## 2024-10-04 DIAGNOSIS — G4733 Obstructive sleep apnea (adult) (pediatric): Secondary | ICD-10-CM

## 2024-10-04 NOTE — Patient Instructions (Addendum)
  VISIT SUMMARY: Today, you came in for your one-year follow-up regarding your mild sleep apnea. We reviewed your recent sleep study results and discussed your current treatment with an oral appliance.  YOUR PLAN: -MILD OBSTRUCTIVE SLEEP APNEA: Mild obstructive sleep apnea means you have occasional interruptions in your breathing during sleep, which can lower your oxygen levels. You are currently using an oral appliance, which you tolerate well. Continue using the oral appliance every night. Watch for symptoms like waking up gasping, choking, increased daytime sleepiness, or weight gain. We also discussed the potential future use of GLP-1 medications for weight management, which may become more affordable in April such as mounjaro (tirzepatide). Follow up with your sleep study provider next year and return for evaluation if your symptoms worsen or if there are changes in your weight or how you use the appliance.  INSTRUCTIONS: Follow up with your sleep study provider next year. Consider discussing GLP-1 medications with your primary care provider in April for weight management. Return for evaluation if your symptoms worsen or if there are changes in your weight or appliance use.

## 2024-10-04 NOTE — Progress Notes (Signed)
 @Patient  ID: Jennifer Gibson, female    DOB: 1951-08-30, 73 y.o.   MRN: [id number]  No chief complaint on file.   Referring provider: Plotnikov, Karlynn GAILS, MD  HPI: 73 year old female, never smoked.  Past medical history significant for OSA on CPAP, acute bronchitis, GERD, thyroid  nodule, chronic kidney sufficiency, COVID-19, obesity, insomnia.  Previous [facility] pulmonary encounter: 10/06/2023 Discussed the use of AI scribe software for clinical note transcription with the patient, who gave verbal consent to proceed.  History of Present Illness   The patient, with a history of sleep apnea, presents for a re-evaluation of her condition. She reports having undergone a sleep study several years ago, which resulted in the prescription of a CPAP machine. However, she no longer has the machine and has not been on therapy for an extended period.  The patient admits to snoring, which she attributes to her weight, but denies any episodes of gasping for air. She occasionally wakes up from her sleep due to vivid dreams but does not feel excessively tired during the day. She denies any daytime napping unless she has been particularly active or the weather is cold.  The patient's weight has remained relatively stable, fluctuating by no more than five to ten pounds. She has not noticed any significant changes in her sleep quality or daytime energy levels since discontinuing CPAP therapy.  The patient also reports an onset of sleep talking, which began after the death of her spouse five years ago. She occasionally wakes up from her sleep due to this. However, she denies any episodes of sleepwalking or other potentially dangerous sleep behaviors.     Sleep questionnaire Symptoms-   snoring  Prior sleep study- yes, unsure date Bedtime- 12am-1am Time to fall asleep- 10-26min Nocturnal awakenings- once Out of bed/start of day- 9-10am Weight changes- 10 lbs  Do you operate heavy machinery- No; Drives  Do you  currently wear CPAP- no Do you current wear oxygen- no Epworth- 7  11/28/2023 Discussed the use of AI scribe software for clinical note transcription with the patient, who gave verbal consent to proceed.  Patient contacted today to review sleep study results. She was seen for consult in December. She has a history of sleep apnea. The patient had a sleep study several years ago, which resulted in a CPAP machine prescription. However, she has not used her CPAP for quite some time. The patient reported no significant benefit from the CPAP therapy, as it exacerbated sinus drainage leading to sinus infections.  She has symptoms of snoring. She occasionally wakes up from her sleep due to vivid dreams but does not feel excessively tired during the day.   She underwent repeat home sleep study December 2024 that showed mild sleep apnea, with an average of 11.5 apneic events per hour. The patient's oxygen level dropped to 85% for about seven minutes during the study. The patient expressed interest in an oral appliance as a potential treatment option for her sleep apnea.   Mild Sleep Apnea Repeat home sleep study shows 11.5 apneic events per hour with oxygen desaturation to 85%. Patient reports snoring and sleep talking, but denies excessive daytime sleepiness or gasping for air. Previous CPAP therapy was not well tolerated due to sinus issues. -Refer to Dr. Nena Riding or Dr. Oneil Forget for evaluation and fitting of an oral appliance. -Advise patient to contact insurance regarding coverage for oral appliance. -Consider conservative management strategies such as weight loss and positional sleep changes if oral appliance is  not feasible. -Consider discussing with primary care about potential use of Zepbound for obesity and sleep apnea if weight loss is difficult.   10/04/2024- Interim hx  Discussed the use of AI scribe software for clinical note transcription with the patient, who gave verbal consent to  proceed.  History of Present Illness Jennifer Gibson is a 73 year old female who presents for a one-year follow-up regarding mild sleep apnea.  She previously underwent a repeat sleep study which showed 11.5 apneic events per hour with oxygen desaturation to 85%. She experiences snoring and sleep talking. No excessive daytime sleepiness or gasping for air.  She was unable to tolerate CPAP therapy due to sinus issues and was referred for evaluation of an oral appliance. She obtained an oral appliance from Dr. Micky, which was fully covered by her Medicare insurance. She uses the oral appliance regularly, especially when experiencing sinus drainage.  Her past medical history includes an attempt with Jardiance, which resulted in side effects.   Sleep testing: HST 10/15/23 showed mild OSA, average AHI 11.5/hour with SpO2 low 85% (patient spent 7 with O2 <88%).   Allergies  Allergen Reactions   Grass Pollen([id number]-Swt Vern)    Penicillins     REACTION: yeast infection    Immunization History  Administered Date(s) Administered   Fluad Quad(high Dose 65+) 07/01/2019   INFLUENZA, HIGH DOSE SEASONAL PF 12/31/2018, 08/31/2020, 09/14/2022   Influenza,inj,Quad PF,6+ Mos 08/02/2014, 08/01/2015   Influenza-Unspecified 10/30/2016, 08/29/2017   Moderna Covid-19 Vaccine  Bivalent Booster 23yrs & up 08/06/2023   PFIZER(Purple Top)SARS-COV-2 Vaccination 12/17/2019, 01/11/2020, 10/18/2020   Pfizer(Comirnaty)Fall Seasonal Vaccine 12 years and older 09/14/2022   Pneumococcal Conjugate-13 08/02/2014, 08/29/2017   Pneumococcal Polysaccharide-23 09/01/2014   Td 11/06/1992, 06/30/2012   Tdap 04/05/2019   Unspecified SARS-COV-2 Vaccination 12/17/2019, 01/11/2020, 05/28/2020, 10/18/2020   Zoster, Live 10/31/2014    Past Medical History:  Diagnosis Date   Allergy    Anemia    Anxiety    Cataract    Chronic kidney disease    stage 3   Detached retina, right 11/05/2007   Hypertension    Morbid obesity  (HCC)    Sleep apnea    doesn't wear CPAP   Venous insufficiency of leg    right foot / ankle -sev.yeras ago    Tobacco History: Social History   Tobacco Use  Smoking Status Never  Smokeless Tobacco Never  Tobacco Comments   alot of second hand smoke   Counseling given: Not Answered Tobacco comments: alot of second hand smoke   Outpatient Medications Prior to Visit  Medication Sig Dispense Refill   Apoaequorin (PREVAGEN PO) Take by mouth daily.     carvedilol  (COREG ) 25 MG tablet TAKE 1 TABLET TWICE A DAY WITH A MEAL. ANNUAL APPOINTMENT IS DUE MUST SEE PROVIDER FOR FUTURE REFILLS 180 tablet 3   Cetirizine HCl 10 MG CAPS Take by mouth daily.     Cholecalciferol (VITAMIN D3) 50 MCG (2000 UT) capsule Take 1 capsule (2,000 Units total) by mouth daily. 100 capsule 3   escitalopram  (LEXAPRO ) 5 MG tablet TAKE 1 TABLET DAILY 90 tablet 3   losartan  (COZAAR ) 25 MG tablet TAKE 1 TABLET TWICE A DAY 180 tablet 3   Multiple Vitamin (MULTIVITAMIN) capsule Take 1 capsule by mouth daily.     Omega-3 Fatty Acids (OMEGA 3 PO) Take 1 capsule by mouth daily. Mega red     pantoprazole  (PROTONIX ) 40 MG tablet TAKE 1 TABLET DAILY. ANNUAL APPOINTMENT DUE AUGUST MUST SEE  PROVIDER FOR FUTURE REFILLS 90 tablet 3   promethazine -dextromethorphan (PROMETHAZINE -DM) 6.25-15 MG/5ML syrup Take 5 mLs by mouth 4 (four) times daily as needed for cough. 240 mL 0   spironolactone -hydrochlorothiazide (ALDACTAZIDE ) 25-25 MG tablet Take 2 tablets by mouth daily. 180 tablet 2   Facility-Administered Medications Prior to Visit  Medication Dose Route Frequency Provider Last Rate Last Admin   spironolactone -hydrochlorothiazide (ALDACTAZIDE ) 25-25 MG per tablet 1 tablet  1 tablet Oral Daily Plotnikov, Aleksei V, MD          Review of Systems  Review of Systems  Constitutional: Negative.   Respiratory: Negative.       Physical Exam  There were no vitals taken for this visit. Physical Exam Constitutional:       Appearance: Normal appearance. She is well-developed. She is obese.  HENT:     Head: Normocephalic and atraumatic.     Mouth/Throat:     Mouth: Mucous membranes are moist.     Pharynx: Oropharynx is clear.  Eyes:     Pupils: Pupils are equal, round, and reactive to light.  Cardiovascular:     Rate and Rhythm: Normal rate and regular rhythm.     Heart sounds: Normal heart sounds. No murmur heard. Pulmonary:     Effort: Pulmonary effort is normal. No respiratory distress.     Breath sounds: Normal breath sounds. No wheezing or rhonchi.  Musculoskeletal:        General: Normal range of motion.     Cervical back: Normal range of motion and neck supple.  Skin:    General: Skin is warm and dry.     Findings: No erythema or rash.  Neurological:     General: No focal deficit present.     Mental Status: She is alert and oriented to person, place, and time. Mental status is at baseline.  Psychiatric:        Mood and Affect: Mood normal.        Behavior: Behavior normal.        Thought Content: Thought content normal.        Judgment: Judgment normal.     Lab Results:  CBC    Component Value Date/Time   WBC 6.6 12/24/2022 0000   WBC 6.1 12/14/2021 0917   RBC 3.96 12/24/2022 0000   HGB 10.4 (A) 12/24/2022 0000   HCT 32 (A) 12/24/2022 0000   PLT 315 12/24/2022 0000   MCV 81.4 12/14/2021 0917   MCH 26.2 (L) 05/29/2020 1453   MCHC 32.0 12/14/2021 0917   RDW 13.6 12/14/2021 0917   LYMPHSABS 1.6 12/14/2021 0917   MONOABS 0.6 12/14/2021 0917   EOSABS 0.2 12/14/2021 0917   BASOSABS 0.0 12/14/2021 0917    BMET    Component Value Date/Time   NA 137 12/24/2022 0000   K 4.5 12/24/2022 0000   CL 101 12/24/2022 0000   CO2 27 (A) 12/24/2022 0000   GLUCOSE 91 12/14/2021 0917   BUN 21 12/24/2022 0000   CREATININE 1.6 (A) 12/24/2022 0000   CREATININE 1.37 (H) 12/14/2021 0917   CREATININE 1.46 (H) 05/29/2020 1453   CALCIUM 10.3 12/24/2022 0000   GFRNONAA 36 (L) 05/29/2020 1453    GFRAA 42 (L) 05/29/2020 1453    BNP No results found for: BNP  ProBNP No results found for: PROBNP  Imaging: No results found.   Assessment & Plan:   1. OSA on CPAP (Primary)  Assessment and Plan Assessment & Plan Mild obstructive sleep apnea 11.5 apneic  events per hour and oxygen desaturation to 85%. Previous CPAP therapy was not tolerated due to sinus issues. Currently using an oral appliance, which is well-tolerated and covered by insurance. No excessive daytime sleepiness or gasping for air reported. Low risk for cardiac arrhythmia, stroke, pulmonary hypertension, and diabetes. Weight loss could potentially improve sleep apnea. Discussed potential future use of GLP-1 medications for weight management, which may become more affordable in April. Zepbound, similar to Mounjaro, is not currently covered due to mild severity of sleep apnea, but may be considered in the future if criteria change. - Continue using oral appliance nightly. - Monitor for symptoms such as waking up gasping, choking, increased daytime sleepiness, or weight gain. - Follow up with Dr. Micky, DDS Next year. - Consider discussing GLP-1 medications with primary care provider in April for weight management. - Return for evaluation if symptoms worsen or if there are changes in weight or appliance use.   Jennifer LELON Ferrari, NP 10/04/2024

## 2024-11-02 ENCOUNTER — Other Ambulatory Visit: Payer: Self-pay | Admitting: Internal Medicine

## 2024-11-17 ENCOUNTER — Other Ambulatory Visit: Payer: Self-pay | Admitting: Internal Medicine

## 2024-11-22 ENCOUNTER — Other Ambulatory Visit: Payer: Self-pay | Admitting: Internal Medicine

## 2025-01-03 ENCOUNTER — Ambulatory Visit: Payer: Medicare Other
